# Patient Record
Sex: Female | Born: 1935 | Race: White | Hispanic: No | Smoking: Never smoker
Health system: Southern US, Community
[De-identification: ages and names within clinical notes are randomized; demographics above are authoritative.]

## PROBLEM LIST (undated history)

## (undated) DIAGNOSIS — F419 Anxiety disorder, unspecified: Secondary | ICD-10-CM

## (undated) DIAGNOSIS — G43909 Migraine, unspecified, not intractable, without status migrainosus: Secondary | ICD-10-CM

## (undated) DIAGNOSIS — I10 Essential (primary) hypertension: Secondary | ICD-10-CM

## (undated) DIAGNOSIS — M1990 Unspecified osteoarthritis, unspecified site: Secondary | ICD-10-CM

## (undated) HISTORY — DX: Unspecified osteoarthritis, unspecified site: M19.90

## (undated) HISTORY — DX: Essential (primary) hypertension: I10

## (undated) HISTORY — DX: Migraine, unspecified, not intractable, without status migrainosus: G43.909

## (undated) HISTORY — PX: ABDOMINAL HYSTERECTOMY: SHX81

## (undated) HISTORY — DX: Anxiety disorder, unspecified: F41.9

---

## 1997-05-19 ENCOUNTER — Ambulatory Visit (HOSPITAL_COMMUNITY): Admission: RE | Admit: 1997-05-19 | Discharge: 1997-05-19 | Payer: Self-pay | Admitting: Gastroenterology

## 1999-12-23 ENCOUNTER — Encounter: Payer: Self-pay | Admitting: Internal Medicine

## 1999-12-23 ENCOUNTER — Ambulatory Visit (HOSPITAL_COMMUNITY): Admission: RE | Admit: 1999-12-23 | Discharge: 1999-12-23 | Payer: Self-pay | Admitting: Internal Medicine

## 2000-01-19 ENCOUNTER — Other Ambulatory Visit: Admission: RE | Admit: 2000-01-19 | Discharge: 2000-01-19 | Payer: Self-pay | Admitting: Obstetrics and Gynecology

## 2000-03-25 ENCOUNTER — Emergency Department (HOSPITAL_COMMUNITY): Admission: EM | Admit: 2000-03-25 | Discharge: 2000-03-25 | Payer: Self-pay | Admitting: Emergency Medicine

## 2000-03-25 ENCOUNTER — Encounter: Payer: Self-pay | Admitting: Emergency Medicine

## 2000-05-30 ENCOUNTER — Ambulatory Visit (HOSPITAL_COMMUNITY): Admission: RE | Admit: 2000-05-30 | Discharge: 2000-05-30 | Payer: Self-pay | Admitting: General Surgery

## 2000-05-30 ENCOUNTER — Encounter (INDEPENDENT_AMBULATORY_CARE_PROVIDER_SITE_OTHER): Payer: Self-pay | Admitting: Specialist

## 2001-01-29 ENCOUNTER — Other Ambulatory Visit: Admission: RE | Admit: 2001-01-29 | Discharge: 2001-01-29 | Payer: Self-pay | Admitting: Obstetrics and Gynecology

## 2001-02-06 HISTORY — PX: OTHER SURGICAL HISTORY: SHX169

## 2001-08-04 ENCOUNTER — Emergency Department (HOSPITAL_COMMUNITY): Admission: EM | Admit: 2001-08-04 | Discharge: 2001-08-05 | Payer: Self-pay | Admitting: Emergency Medicine

## 2001-08-05 ENCOUNTER — Encounter: Payer: Self-pay | Admitting: *Deleted

## 2003-04-09 ENCOUNTER — Ambulatory Visit (HOSPITAL_COMMUNITY): Admission: RE | Admit: 2003-04-09 | Discharge: 2003-04-09 | Payer: Self-pay | Admitting: General Surgery

## 2003-04-09 ENCOUNTER — Encounter (INDEPENDENT_AMBULATORY_CARE_PROVIDER_SITE_OTHER): Payer: Self-pay | Admitting: Specialist

## 2003-04-14 ENCOUNTER — Other Ambulatory Visit: Admission: RE | Admit: 2003-04-14 | Discharge: 2003-04-14 | Payer: Self-pay | Admitting: Obstetrics and Gynecology

## 2003-05-12 ENCOUNTER — Encounter: Admission: RE | Admit: 2003-05-12 | Discharge: 2003-05-12 | Payer: Self-pay | Admitting: General Practice

## 2003-12-10 ENCOUNTER — Ambulatory Visit: Payer: Self-pay

## 2004-01-18 ENCOUNTER — Ambulatory Visit: Payer: Self-pay | Admitting: Internal Medicine

## 2004-01-22 ENCOUNTER — Ambulatory Visit: Payer: Self-pay | Admitting: Internal Medicine

## 2004-03-30 ENCOUNTER — Ambulatory Visit: Payer: Self-pay | Admitting: Internal Medicine

## 2004-04-04 ENCOUNTER — Ambulatory Visit: Payer: Self-pay | Admitting: Internal Medicine

## 2004-06-17 ENCOUNTER — Ambulatory Visit: Payer: Self-pay | Admitting: Internal Medicine

## 2004-06-21 ENCOUNTER — Ambulatory Visit: Payer: Self-pay | Admitting: Cardiology

## 2004-07-29 ENCOUNTER — Ambulatory Visit: Payer: Self-pay | Admitting: Gastroenterology

## 2004-08-05 ENCOUNTER — Encounter (INDEPENDENT_AMBULATORY_CARE_PROVIDER_SITE_OTHER): Payer: Self-pay | Admitting: Specialist

## 2004-08-05 ENCOUNTER — Ambulatory Visit: Payer: Self-pay | Admitting: Gastroenterology

## 2004-09-09 ENCOUNTER — Encounter: Admission: RE | Admit: 2004-09-09 | Discharge: 2004-09-09 | Payer: Self-pay | Admitting: Obstetrics and Gynecology

## 2005-01-11 ENCOUNTER — Ambulatory Visit: Payer: Self-pay | Admitting: Internal Medicine

## 2005-01-18 ENCOUNTER — Ambulatory Visit: Payer: Self-pay | Admitting: Internal Medicine

## 2005-03-07 ENCOUNTER — Ambulatory Visit: Payer: Self-pay | Admitting: Internal Medicine

## 2005-03-22 ENCOUNTER — Ambulatory Visit: Payer: Self-pay | Admitting: Internal Medicine

## 2005-03-24 ENCOUNTER — Ambulatory Visit: Payer: Self-pay | Admitting: Internal Medicine

## 2005-09-18 ENCOUNTER — Ambulatory Visit: Payer: Self-pay | Admitting: Internal Medicine

## 2005-09-20 ENCOUNTER — Encounter: Admission: RE | Admit: 2005-09-20 | Discharge: 2005-09-20 | Payer: Self-pay | Admitting: Obstetrics and Gynecology

## 2005-09-28 ENCOUNTER — Ambulatory Visit: Payer: Self-pay | Admitting: Internal Medicine

## 2005-11-23 ENCOUNTER — Ambulatory Visit: Payer: Self-pay | Admitting: Internal Medicine

## 2005-12-12 ENCOUNTER — Ambulatory Visit: Payer: Self-pay

## 2005-12-20 ENCOUNTER — Ambulatory Visit: Payer: Self-pay | Admitting: Internal Medicine

## 2006-01-02 ENCOUNTER — Emergency Department (HOSPITAL_COMMUNITY): Admission: EM | Admit: 2006-01-02 | Discharge: 2006-01-02 | Payer: Self-pay | Admitting: Emergency Medicine

## 2006-01-05 ENCOUNTER — Emergency Department (HOSPITAL_COMMUNITY): Admission: EM | Admit: 2006-01-05 | Discharge: 2006-01-05 | Payer: Self-pay | Admitting: Family Medicine

## 2006-01-15 ENCOUNTER — Emergency Department (HOSPITAL_COMMUNITY): Admission: EM | Admit: 2006-01-15 | Discharge: 2006-01-15 | Payer: Self-pay | Admitting: Emergency Medicine

## 2006-06-21 ENCOUNTER — Emergency Department (HOSPITAL_COMMUNITY): Admission: EM | Admit: 2006-06-21 | Discharge: 2006-06-21 | Payer: Self-pay | Admitting: Family Medicine

## 2006-09-06 ENCOUNTER — Ambulatory Visit: Payer: Self-pay | Admitting: Internal Medicine

## 2006-09-06 LAB — CONVERTED CEMR LAB
AST: 21 units/L (ref 0–37)
Albumin: 3.5 g/dL (ref 3.5–5.2)
Basophils Absolute: 0 10*3/uL (ref 0.0–0.1)
Bilirubin, Direct: 0.1 mg/dL (ref 0.0–0.3)
Direct LDL: 204.6 mg/dL
Eosinophils Absolute: 0.1 10*3/uL (ref 0.0–0.6)
Eosinophils Relative: 2.3 % (ref 0.0–5.0)
GFR calc Af Amer: 56 mL/min
GFR calc non Af Amer: 46 mL/min
Glucose, Bld: 93 mg/dL (ref 70–99)
HCT: 34.4 % — ABNORMAL LOW (ref 36.0–46.0)
HDL: 36.7 mg/dL — ABNORMAL LOW (ref >39.0)
Lymphocytes Relative: 37.5 % (ref 12.0–46.0)
MCHC: 35.1 g/dL (ref 30.0–36.0)
MCV: 85.1 fL (ref 78.0–100.0)
Monocytes Absolute: 0.6 10*3/uL (ref 0.2–0.7)
Neutro Abs: 3.1 10*3/uL (ref 1.4–7.7)
Neutrophils Relative %: 50.1 % (ref 43.0–77.0)
Potassium: 4.6 meq/L (ref 3.5–5.1)
RBC: 4.05 M/uL (ref 3.87–5.11)
Sodium: 141 meq/L (ref 135–145)
TSH: 0.92 microintl units/mL (ref 0.35–5.50)
Triglycerides: 133 mg/dL (ref 0–149)
WBC: 6.1 10*3/uL (ref 4.5–10.5)

## 2006-11-01 ENCOUNTER — Encounter: Admission: RE | Admit: 2006-11-01 | Discharge: 2006-11-01 | Payer: Self-pay | Admitting: Obstetrics and Gynecology

## 2006-11-26 ENCOUNTER — Ambulatory Visit: Payer: Self-pay | Admitting: Internal Medicine

## 2006-11-26 DIAGNOSIS — K649 Unspecified hemorrhoids: Secondary | ICD-10-CM | POA: Insufficient documentation

## 2006-11-26 DIAGNOSIS — K589 Irritable bowel syndrome without diarrhea: Secondary | ICD-10-CM

## 2006-11-26 DIAGNOSIS — G43809 Other migraine, not intractable, without status migrainosus: Secondary | ICD-10-CM

## 2006-11-26 LAB — CONVERTED CEMR LAB
Basophils Absolute: 0.1 10*3/uL (ref 0.0–0.1)
Bilirubin, Direct: 0.1 mg/dL (ref 0.0–0.3)
CO2: 28 meq/L (ref 19–32)
Cholesterol: 290 mg/dL (ref 0–200)
Direct LDL: 208.4 mg/dL
Eosinophils Absolute: 0.1 10*3/uL (ref 0.0–0.6)
Eosinophils Relative: 1.9 % (ref 0.0–5.0)
GFR calc Af Amer: 56 mL/min
Glucose, Bld: 96 mg/dL (ref 70–99)
HDL: 48.7 mg/dL (ref >39.0)
Hemoglobin, Urine: NEGATIVE
Ketones, ur: NEGATIVE mg/dL
Lymphocytes Relative: 35.3 % (ref 12.0–46.0)
MCHC: 33.7 g/dL (ref 30.0–36.0)
MCV: 87.7 fL (ref 78.0–100.0)
Mucus, UA: NEGATIVE
Neutro Abs: 3.1 10*3/uL (ref 1.4–7.7)
Platelets: 321 10*3/uL (ref 150–400)
Potassium: 4.6 meq/L (ref 3.5–5.1)
RBC / HPF: NONE SEEN
TSH: 1.48 microintl units/mL (ref 0.35–5.50)
Total Protein, Urine: NEGATIVE mg/dL
Total Protein: 7.5 g/dL (ref 6.0–8.3)
Triglycerides: 81 mg/dL (ref 0–149)
Urine Glucose: NEGATIVE mg/dL
Urobilinogen, UA: 0.2 (ref 0.0–1.0)
WBC: 5.7 10*3/uL (ref 4.5–10.5)

## 2006-12-03 ENCOUNTER — Encounter: Payer: Self-pay | Admitting: Internal Medicine

## 2006-12-03 DIAGNOSIS — M1712 Unilateral primary osteoarthritis, left knee: Secondary | ICD-10-CM

## 2006-12-03 DIAGNOSIS — F411 Generalized anxiety disorder: Secondary | ICD-10-CM | POA: Insufficient documentation

## 2006-12-03 DIAGNOSIS — M949 Disorder of cartilage, unspecified: Secondary | ICD-10-CM

## 2006-12-03 DIAGNOSIS — E034 Atrophy of thyroid (acquired): Secondary | ICD-10-CM

## 2006-12-03 DIAGNOSIS — D509 Iron deficiency anemia, unspecified: Secondary | ICD-10-CM

## 2006-12-03 DIAGNOSIS — M899 Disorder of bone, unspecified: Secondary | ICD-10-CM | POA: Insufficient documentation

## 2007-02-20 ENCOUNTER — Ambulatory Visit: Payer: Self-pay | Admitting: Internal Medicine

## 2007-02-20 DIAGNOSIS — E7800 Pure hypercholesterolemia, unspecified: Secondary | ICD-10-CM

## 2007-02-20 DIAGNOSIS — F418 Other specified anxiety disorders: Secondary | ICD-10-CM

## 2007-02-21 LAB — CONVERTED CEMR LAB
ALT: 16 units/L (ref 0–35)
AST: 20 units/L (ref 0–37)
Bilirubin, Direct: 0.1 mg/dL (ref 0.0–0.3)
Calcium: 9.2 mg/dL (ref 8.4–10.5)
Chloride: 106 meq/L (ref 96–112)
Cholesterol: 276 mg/dL (ref 0–200)
GFR calc non Af Amer: 42 mL/min
Glucose, Bld: 95 mg/dL (ref 70–99)
Hemoglobin, Urine: NEGATIVE
Leukocytes, UA: NEGATIVE
Urobilinogen, UA: 0.2 (ref 0.0–1.0)
VLDL: 24 mg/dL (ref 0–40)
pH: 5.5 (ref 5.0–8.0)

## 2007-02-28 ENCOUNTER — Ambulatory Visit: Payer: Self-pay | Admitting: Internal Medicine

## 2007-06-19 ENCOUNTER — Ambulatory Visit: Payer: Self-pay | Admitting: Internal Medicine

## 2007-06-21 ENCOUNTER — Ambulatory Visit: Payer: Self-pay | Admitting: Internal Medicine

## 2007-06-21 LAB — CONVERTED CEMR LAB
BUN: 16 mg/dL (ref 6–23)
CO2: 27 meq/L (ref 19–32)
Calcium: 9.4 mg/dL (ref 8.4–10.5)
Chloride: 108 meq/L (ref 96–112)
Creatinine, Ser: 1.3 mg/dL — ABNORMAL HIGH (ref 0.4–1.2)
GFR calc Af Amer: 51 mL/min
GFR calc non Af Amer: 42 mL/min
Glucose, Bld: 96 mg/dL (ref 70–99)
Potassium: 5 meq/L (ref 3.5–5.1)
Sodium: 142 meq/L (ref 135–145)

## 2007-06-26 ENCOUNTER — Ambulatory Visit: Payer: Self-pay | Admitting: Internal Medicine

## 2007-06-26 DIAGNOSIS — G4700 Insomnia, unspecified: Secondary | ICD-10-CM

## 2007-08-07 ENCOUNTER — Encounter: Payer: Self-pay | Admitting: Internal Medicine

## 2007-10-03 ENCOUNTER — Ambulatory Visit: Payer: Self-pay | Admitting: Internal Medicine

## 2007-10-06 LAB — CONVERTED CEMR LAB
BUN: 14 mg/dL (ref 6–23)
CO2: 27 meq/L (ref 19–32)
Chloride: 109 meq/L (ref 96–112)
Creatinine, Ser: 1.3 mg/dL — ABNORMAL HIGH (ref 0.4–1.2)
GFR calc non Af Amer: 42 mL/min
Glucose, Bld: 94 mg/dL (ref 70–99)

## 2007-10-07 ENCOUNTER — Ambulatory Visit: Payer: Self-pay | Admitting: Internal Medicine

## 2007-10-07 DIAGNOSIS — E875 Hyperkalemia: Secondary | ICD-10-CM

## 2007-10-22 ENCOUNTER — Ambulatory Visit: Payer: Self-pay | Admitting: Internal Medicine

## 2007-10-24 LAB — CONVERTED CEMR LAB
BUN: 24 mg/dL — ABNORMAL HIGH (ref 6–23)
Chloride: 111 meq/L (ref 96–112)
GFR calc Af Amer: 47 mL/min
GFR calc non Af Amer: 39 mL/min
Potassium: 5.1 meq/L (ref 3.5–5.1)
Sodium: 140 meq/L (ref 135–145)

## 2007-10-25 ENCOUNTER — Encounter (INDEPENDENT_AMBULATORY_CARE_PROVIDER_SITE_OTHER): Payer: Self-pay | Admitting: *Deleted

## 2007-11-04 ENCOUNTER — Encounter: Admission: RE | Admit: 2007-11-04 | Discharge: 2007-11-04 | Payer: Self-pay | Admitting: Obstetrics and Gynecology

## 2008-05-08 ENCOUNTER — Ambulatory Visit (HOSPITAL_COMMUNITY): Admission: RE | Admit: 2008-05-08 | Discharge: 2008-05-08 | Payer: Self-pay | Admitting: Family Medicine

## 2008-05-19 ENCOUNTER — Encounter: Admission: RE | Admit: 2008-05-19 | Discharge: 2008-05-19 | Payer: Self-pay | Admitting: Family Medicine

## 2008-10-07 ENCOUNTER — Encounter: Payer: Self-pay | Admitting: Internal Medicine

## 2008-10-19 ENCOUNTER — Ambulatory Visit: Payer: Self-pay

## 2008-10-19 ENCOUNTER — Encounter: Payer: Self-pay | Admitting: Family Medicine

## 2008-10-19 ENCOUNTER — Encounter: Payer: Self-pay | Admitting: Internal Medicine

## 2008-11-24 ENCOUNTER — Encounter: Admission: RE | Admit: 2008-11-24 | Discharge: 2008-11-24 | Payer: Self-pay | Admitting: Obstetrics and Gynecology

## 2009-02-28 LAB — CREATININE, SERUM: Creat: 1.3

## 2009-03-15 ENCOUNTER — Encounter: Admission: RE | Admit: 2009-03-15 | Discharge: 2009-03-15 | Payer: Self-pay | Admitting: Family Medicine

## 2009-06-08 ENCOUNTER — Encounter: Payer: Self-pay | Admitting: Gastroenterology

## 2009-07-20 ENCOUNTER — Encounter (INDEPENDENT_AMBULATORY_CARE_PROVIDER_SITE_OTHER): Payer: Self-pay | Admitting: *Deleted

## 2009-07-22 ENCOUNTER — Encounter (INDEPENDENT_AMBULATORY_CARE_PROVIDER_SITE_OTHER): Payer: Self-pay | Admitting: *Deleted

## 2009-08-23 ENCOUNTER — Encounter (INDEPENDENT_AMBULATORY_CARE_PROVIDER_SITE_OTHER): Payer: Self-pay | Admitting: *Deleted

## 2009-08-25 ENCOUNTER — Ambulatory Visit: Payer: Self-pay | Admitting: Gastroenterology

## 2009-09-06 ENCOUNTER — Telehealth: Payer: Self-pay | Admitting: Gastroenterology

## 2009-09-06 ENCOUNTER — Ambulatory Visit: Payer: Self-pay | Admitting: Gastroenterology

## 2009-11-07 ENCOUNTER — Ambulatory Visit: Payer: Self-pay | Admitting: Family

## 2009-11-07 ENCOUNTER — Inpatient Hospital Stay (HOSPITAL_COMMUNITY): Admission: AD | Admit: 2009-11-07 | Discharge: 2009-11-07 | Payer: Self-pay | Admitting: Obstetrics and Gynecology

## 2009-12-01 ENCOUNTER — Encounter: Admission: RE | Admit: 2009-12-01 | Discharge: 2009-12-01 | Payer: Self-pay | Admitting: Obstetrics and Gynecology

## 2010-02-26 ENCOUNTER — Encounter: Payer: Self-pay | Admitting: Internal Medicine

## 2010-02-27 ENCOUNTER — Encounter: Payer: Self-pay | Admitting: Gastroenterology

## 2010-02-27 ENCOUNTER — Encounter: Payer: Self-pay | Admitting: Unknown Physician Specialty

## 2010-03-06 LAB — CONVERTED CEMR LAB
BUN: 19 mg/dL (ref 6–23)
Chloride: 113 meq/L — ABNORMAL HIGH (ref 96–112)
GFR calc Af Amer: 51 mL/min
Glucose, Bld: 97 mg/dL (ref 70–99)
Potassium: 6.1 meq/L (ref 3.5–5.1)

## 2010-03-06 LAB — BASIC METABOLIC PANEL: Creatinine: 1.4 mg/dL — AB (ref <=1.1)

## 2010-03-08 NOTE — Procedures (Signed)
Summary: Upper Endoscopy/[facility]  Upper Endoscopy/[facility]   Imported By: Sherian Rein 09/08/2009 10:31:07  _____________________________________________________________________  External Attachment:    Type:   Image     Comment:   External Document

## 2010-03-08 NOTE — Progress Notes (Signed)
Summary: Canceled colonoscopy   Phone Note Call from Patient Call back at Home Phone [phone]   Caller: Daughter Call For: Dr. Arlyce Dice Summary of Call: Canceled COL for 09-08-09 due to her mother's age she does not feel like she is up to having the procedure. Would you like this pt. charged the cancelation fee? Initial call taken by: Karna Christmas,  September 06, 2009 8:20 AM  Follow-up for Phone Call        yes Follow-up by: Louis Meckel MD,  September 06, 2009 9:38 AM  Additional Follow-up for Phone Call Additional follow up Details #1::        Patient BILLED. Additional Follow-up by: Leanor Kail [facility],  September 10, 2009 4:19 PM

## 2010-03-08 NOTE — Letter (Signed)
Summary: Previsit letter  [facility]  [address] [place], [place] [address]   Phone: [phone]  Fax: [phone]       07/22/2009 MRN: [id number]  Anita Banks [address] [place], [place]  [address]  Dear [name],  Welcome to the Gastroenterology Division at [facility].    You are scheduled to see a nurse for your pre-procedure visit on 08-25-09 at 4:00p.m. on the 3rd floor at [facility], [address]  We ask that you try to arrive at our office 15 minutes prior to your appointment time to allow for check-in.  Your nurse visit will consist of discussing your medical and surgical history, your immediate family medical history, and your medications.    Please bring a complete list of all your medications or, if you prefer, bring the medication bottles and we will list them.  We will need to be aware of both prescribed and over the counter drugs.  We will need to know exact dosage information as well.  If you are on blood thinners (Coumadin, Plavix, Aggrenox, Ticlid, etc.) please call our office today/prior to your appointment, as we need to consult with your physician about holding your medication.   Please be prepared to read and sign documents such as consent forms, a financial agreement, and acknowledgement forms.  If necessary, and with your consent, a friend or relative is welcome to sit-in on the nurse visit with you.  Please bring your insurance card so that we may make a copy of it.  If your insurance requires a referral to see a specialist, please bring your referral form from your primary care physician.  No co-pay is required for this nurse visit.     If you cannot keep your appointment, please call [phone] to cancel or reschedule prior to your appointment date.  This allows [place] the opportunity to schedule an appointment for another patient in need of care.    Thank you for choosing [facility] for your  medical needs.  We appreciate the opportunity to care for you.  Please visit [place] at our website  to learn more about our practice.                     Sincerely.                                                                                                                   The Gastroenterology Division

## 2010-03-08 NOTE — Procedures (Signed)
Summary: COLONOSCOPY   Colonoscopy  Procedure date:  08/05/2004  Findings:      Location:  [facility].      Patient Name: Anita Banks, Anita Banks MRN:  Procedure Procedures: Colonoscopy CPT: [phone].    with polypectomy. CPT: A3573898.  Personnel: Endoscopist: Barbette Hair. Arlyce Dice, MD.  Patient Consent: Procedure, Alternatives, Risks and Benefits discussed, consent obtained, from patient.  Indications Symptoms: Abdominal pain / bloating.  History  Current Medications: Patient is not currently taking Coumadin.  Pre-Exam Physical: Performed Aug 05, 2004. Cardio-pulmonary exam, HEENT exam , Abdominal exam, Mental status exam WNL.  Exam Exam: Extent of exam reached: Cecum, extent intended: Cecum.  The cecum was identified by IC valve. Colon retroflexion performed. ASA Classification: II. Tolerance: good.  Monitoring: Pulse and BP monitoring, Oximetry used. Supplemental O2 given. at 2 Liters.  Colon Prep Used Miralax for colon prep. Prep results: good.  Sedation Meds: Patient assessed and found to be appropriate for moderate (conscious) sedation. Sedation was managed by the Endoscopist. Fentanyl 100 mcg. given IV. Versed 7 mg. given IV.  Findings - MELANOSIS: Cecum to Rectum.  POLYP: Sigmoid Colon, Maximum size: 6 mm. Distance from Anus 18 cm. Procedure:  snare with cautery, Polyp sent to pathology. ICD9: Colon Polyps: 211.3.   Assessment Abnormal examination, see findings above.  Diagnoses: 211.3: Colon Polyps.   Events  Unplanned Interventions: No intervention was required.  Unplanned Events: There were no complications. Plans  Post Exam Instructions: Post sedation instructions given.  Patient Education: Patient given standard instructions for: Polyps.  Disposition: After procedure patient sent to recovery. After recovery patient sent home.  Scheduling/Referral: Colonoscopy, to Barbette Hair. Arlyce Dice, MD, around Aug 05, 2009.    This report was  created from the original endoscopy report, which was reviewed and signed by the above listed endoscopist.

## 2010-03-08 NOTE — Letter (Signed)
Summary: Anita Banks Instructions  [facility]  [address] [place], [place] [address]   Phone: [phone]  Fax: [phone]       Anita Banks    29-May-1927    MRN: [id number]        Procedure Day Dorna Bloom:  Wednesday 09/08/2009     Arrival Time: 8:30 am      Procedure Time:  9:30 am     Location of Procedure:                    _x _  [facility] (4th Floor)                        PREPARATION FOR COLONOSCOPY WITH MOVIPREP   Starting 5 days prior to your procedure Friday 7/29 do not eat nuts, seeds, popcorn, corn, beans, peas,  salads, or any raw vegetables.  Do not take any fiber supplements (e.g. Metamucil, Citrucel, and Benefiber).  THE DAY BEFORE YOUR PROCEDURE         DATE: Tuesday 8/2  1.  Drink clear liquids the entire day-NO SOLID FOOD  2.  Do not drink anything colored red or purple.  Avoid juices with pulp.  No orange juice.  3.  Drink at least 64 oz. (8 glasses) of fluid/clear liquids during the day to prevent dehydration and help the prep work efficiently.  CLEAR LIQUIDS INCLUDE: Water Jello Ice Popsicles Tea (sugar ok, no milk/cream) Powdered fruit flavored drinks Coffee (sugar ok, no milk/cream) Gatorade Juice: apple, white grape, white cranberry  Lemonade Clear bullion, consomm, broth Carbonated beverages (any kind) Strained chicken noodle soup Hard Candy                             4.  In the morning, mix first dose of MoviPrep solution:    Empty 1 Pouch A and 1 Pouch B into the disposable container    Add lukewarm drinking water to the top line of the container. Mix to dissolve    Refrigerate (mixed solution should be used within 24 hrs)  5.  Begin drinking the prep at 5:00 p.m. The MoviPrep container is divided by 4 marks.   Every 15 minutes drink the solution down to the next mark (approximately 8 oz) until the full liter is complete.   6.  Follow completed prep with 16 oz of clear liquid of your choice  (Nothing red or purple).  Continue to drink clear liquids until bedtime.  7.  Before going to bed, mix second dose of MoviPrep solution:    Empty 1 Pouch A and 1 Pouch B into the disposable container    Add lukewarm drinking water to the top line of the container. Mix to dissolve    Refrigerate  THE DAY OF YOUR PROCEDURE      DATE: Wednesday 8/3  Beginning at 4:30 a.m. (5 hours before procedure):         1. Every 15 minutes, drink the solution down to the next mark (approx 8 oz) until the full liter is complete.  2. Follow completed prep with 16 oz. of clear liquid of your choice.    3. You may drink clear liquids until 7:30 am 2 HOURS BEFORE PROCEDURE).   MEDICATION INSTRUCTIONS  Unless otherwise instructed, you should take regular prescription medications with a small sip of water   as early as possible the morning  of your procedure.           OTHER INSTRUCTIONS  You will need a responsible adult at least 75 years of age to accompany you and drive you home.   This person must remain in the waiting room during your procedure.  Wear loose fitting clothing that is easily removed.  Leave jewelry and other valuables at home.  However, you may wish to bring a book to read or  an iPod/MP3 player to listen to music as you wait for your procedure to start.  Remove all body piercing jewelry and leave at home.  Total time from sign-in until discharge is approximately 2-3 hours.  You should go home directly after your procedure and rest.  You can resume normal activities the  day after your procedure.  The day of your procedure you should not:   Drive   Make legal decisions   Operate machinery   Drink alcohol   Return to work  You will receive specific instructions about eating, activities and medications before you leave.    The above instructions have been reviewed and explained to me by   Ezra Sites RN  August 25, 2009 4:35 PM     I fully understand and  can verbalize these instructions _____________________________ Date _________

## 2010-03-08 NOTE — Miscellaneous (Signed)
Summary: [facility]  Clinical Lists Changes  Medications: Added new medication of MOVIPREP 100 GM  SOLR (PEG-KCL-NACL-NASULF-NA ASC-C) As per prep instructions. - Signed Rx of MOVIPREP 100 GM  SOLR (PEG-KCL-NACL-NASULF-NA ASC-C) As per prep instructions.;  #1 x 0;  Signed;  Entered by: Ezra Sites RN;  Authorized by: Louis Meckel MD;  Method used: Electron[facility] to [facility] [address] [phone]*, [address] [place], [place], [place]  [address], Ph: [phone] or [phone], Fax: [phone] Observations: Added new observation of ALLERGY REV: Done (08/25/2009 15:33)    Prescriptions: MOVIPREP 100 GM  SOLR (PEG-KCL-NACL-NASULF-NA ASC-C) As per prep instructions.  #1 x 0   Entered by:   Ezra Sites RN   Authorized by:   Louis Meckel MD   Signed by:   Ezra Sites RN on 08/25/2009   Method used:   [facility]        [facility] [address] [phone]* (retail)       [address]       [place], [place]  [address]       Ph: [phone] or [phone]       Fax: [phone]   RxID:   [id number]

## 2010-03-08 NOTE — Letter (Signed)
Summary: Colonoscopy Letter  [facility]  [address] [place], [place] [address]   Phone: [phone]  Fax: [phone]      July 20, 2009 MRN: [id number]   Anita Banks [address] [place], [place]  [address]   Dear Ms. Bari,   According to your medical record, it is time for you to schedule a Colonoscopy. The American Cancer Society recommends this procedure as a method to detect early colon cancer. Patients with a family history of colon cancer, or a personal history of colon polyps or inflammatory bowel disease are at increased risk.  This letter has been generated based on the recommendations made at the time of your procedure. If you feel that in your particular situation this may no longer apply, please contact our office.  Please call our office at [phone] to schedule this appointment or to update your records at your earliest convenience.  Thank you for cooperating with [place] to provide you with the very best care possible.   Sincerely,  Barbette Hair. Arlyce Dice, M.D.  [facility]enter[facility] [phone]

## 2010-04-06 LAB — TSH: TSH: 6.21 u[IU]/mL — AB (ref <=5.90)

## 2010-06-24 NOTE — Op Note (Signed)
[facility]  Patient:    Anita Banks, Anita Banks                 MRN: [id number] Proc. Date: 05/30/00 Adm. Date:  [id number] Attending:  Carson Myrtle CC:         Sheppard Plumber. Earlene Plater, M.D.  Sherry A. Rosalio Macadamia, M.D.   Operative Report  PREOPERATIVE DIAGNOSIS:  Internal and external hemorrhoids.  POSTOPERATIVE DIAGNOSIS:  Internal and external hemorrhoids.  PROCEDURE:  Hemorrhoidectomy.  SURGEON:  Timothy E. Earlene Plater, M.D.  ANESTHESIA:  General.  INDICATIONS:  This is a 77 year old [place] female who is attended by her daughter, who speaks [place], who is scheduled to have examination under anesthesia and gynecologic procedure for uterine polyps by Cordelia Pen A. Rosalio Macadamia, M.D., and because of a long history of hemorrhoids, protrusion, soilage, and drainage, she wishes to proceed with a hemorrhoidectomy, and I have agreed.  She does have a history of constipation and uses chronic laxatives and has difficult stools.   The procedure has been carefully explained to her and her daughter, and I believe they agree and understand.  PROCEDURE:  The patient was in the GYN outpatient unit under general anesthesia, and Sherry A. Rosalio Macadamia, M.D., had completed her portion of the procedure.  The patient was stable and in the lithotomy position.  The perianal area was inspected.  A proctoscopy was accomplished to 25 cm and was negative.  The scope was withdrawn.  The anus was injected round about with Marcaine 0.5% with epinephrine, mixed 9:1 with Wydase, and that was massaged in well.  A prominent hemorrhoid was noted anteriorly with a large external tag and a dermal component and internal hemorrhoid of a fourth-degree nature. In the left lateral position, there was a third-degree internal hemorrhoid.  A band ligated the left lateral, excised completely the anterior complex, and closed that wound with a running 2-0 chromic suture.  There was no bleeding  or complication, and though the sphincters were snug, I did not do a sphincterotomy or injure the sphincters.  This completed the procedure. Gelfoam, gauze, and a dry, sterile dressing were applied.  She tolerated it well.  She was removed to the recovery room in good condition.  Written and verbal instructions were given carefully to the daughter for interpretation, along with Percocet for pain, and she will be followed in the office. DD:  05/30/00 TD:  05/31/00 Job: [id number]

## 2010-06-24 NOTE — Op Note (Signed)
NAME:  Anita Banks, Anita Banks                    ACCOUNT NO.:  [id number]   MEDICAL RECORD NO.:  [id number]                   PATIENT TYPE:  AMB   LOCATION:  DAY                                  FACILITY[facility]  [facility]   PHYSICIAN:  Timothy E. Earlene Plater, M.D.              DATE OF BIRTH:  Oct 05, 1927   DATE OF PROCEDURE:  04/09/2003  DATE OF DISCHARGE:                                 OPERATIVE REPORT   PREOPERATIVE DIAGNOSES:  Squamous cell carcinoma right lower leg.   POSTOPERATIVE DIAGNOSES:  Squamous cell carcinoma right lower leg.   PROCEDURE:  Wide excision squamous cell carcinoma right leg.   SURGEON:  Timothy E. Earlene Plater, M.D.   ANESTHESIA:  Local standby.   [name] is 7, [place], has with her the family interpreters. She had a  partial excision squamous cell carcinoma right lower leg by [facility]. Danella Deis,  M.D. and was referred to me for complete excision. The patient does  understand and is agreeable, has signed the permit.  The healing wound is  identified on the anterior lateral aspect of the right lower leg.   She is taken to the operating room, IV started, sedation given. The right  lower leg was prepped and draped in the usual fashion.  A 0.25% Marcaine  with epinephrine was used for wide local anesthesia.  Under magnification,  the skin lesion was marked and a 5 mm margin all around it was identified.  This was 15 x 16 mm. The total elliptical horizontal incision necessary was  16 x 40 mm. This was fashioned and then accomplished and the skin ellipse  removed. It was marked for pathology and sent to pathology in formalin.  Bleeding points were cauterized, the wound was undermined in all aspects and  the closure was in layers.  Retention sutures of 2-0 nylon were used and  final closure sutures of 3-0 nylon were used.  Counts correct, she tolerated  it well, dry sterile dressing applied and she was removed to the recovery  room in good condition.   Written and verbal instructions  were given including Darvocet #36. She will  be followed in the office.                                               Timothy E. Earlene Plater, M.D.    TED/MEDQ  D:  04/09/2003  T:  04/09/2003  Job:  [id number]   cc:   Hope M. Danella Deis, M.D.  [address]  [place]  [place] [address]  Fax: [phone]

## 2010-06-24 NOTE — Op Note (Signed)
[facility]  Patient:    Anita Banks, Anita Banks                 MRN: [id number] Proc. Date: 05/30/00 Adm. Date:  [id number] Attending:  Carson Myrtle                           Operative Report  PREOPERATIVE DIAGNOSIS:  Thickened endometrium.  POSTOPERATIVE DIAGNOSES:  Thickened endometrium and endometrial polyp.  PROCEDURE:  D&C hysteroscopy with the resectoscope.  SURGEON:  Dr. Rosalio Macadamia.  ANESTHESIA:  General.  INDICATIONS FOR PROCEDURE:  This is a a 77 year old, G1, P1-0-0-1 woman who was found to have a thickened endometrium on ultrasound. Ultrasound had been performed because of a problem with right lower quadrant pain. The patient has had no vaginal bleeding but because of the persistence of this thickening on ultrasound, the patients brought to the operating room for [facility] hysteroscopy with the resectoscope. The patients also complained of painful hemorrhoids and the patient will be operated on by Dr. Kendrick Ranch for a hemorrhoidectomy following this procedure.  FINDINGS:  Approximately 2-3 cm endometrial polyp, normal size anteflexed uterus, no adnexal mass.  DESCRIPTION OF PROCEDURE:  The patient was brought into the operating room and given adequate general anesthesia. She was placed in a dorsal lithotomy position. Her perineum and vagina were washed with Betadine. Pelvic examination was performed, the bladder was in and out catheterized sterilely. The physicians gown and gloves were changed. The patient was draped in a sterile fashion. A speculum was placed within the vagina. Paracervical block was administered with 2% ______. The anterior lip of the cervix was grasped with a single tooth tenaculum, the cervix was sounded, cervix was dilated with the [facility] dilators to a #31.  The hysteroscope was introduced, pictures were obtained. The endometrial polyps were removed with a double loop right angled resector. There was one large polyp  present and very small polyp near the right cornua present as well. Thin small superficial samples were taken circumferentially from the endometrial tissue. Adequate hemostasis was present. All instruments were removed from the vagina. The patient was then operated on by Dr. Earlene Plater which is dictated separately. After Dr. Earlene Plater surgery, she was awakened, she was extubated and removed from the operating table to the stretcher in stable condition. Complications were none. Estimated blood loss less than 5 cc. Sorbitol differential -10 cc. DD:  05/30/00 TD:  05/30/00 Job: [id number]

## 2010-08-06 ENCOUNTER — Emergency Department (HOSPITAL_COMMUNITY)
Admission: EM | Admit: 2010-08-06 | Discharge: 2010-08-06 | Disposition: A | Discharge status: Home / Self Care | Payer: Medicare Other | Attending: Emergency Medicine | Admitting: Emergency Medicine

## 2010-08-06 ENCOUNTER — Emergency Department (HOSPITAL_COMMUNITY): Payer: Medicare Other

## 2010-08-06 DIAGNOSIS — K219 Gastro-esophageal reflux disease without esophagitis: Secondary | ICD-10-CM | POA: Insufficient documentation

## 2010-08-06 DIAGNOSIS — R5381 Other malaise: Secondary | ICD-10-CM | POA: Insufficient documentation

## 2010-08-06 DIAGNOSIS — E785 Hyperlipidemia, unspecified: Secondary | ICD-10-CM | POA: Insufficient documentation

## 2010-08-06 DIAGNOSIS — M79609 Pain in unspecified limb: Secondary | ICD-10-CM | POA: Insufficient documentation

## 2010-08-06 DIAGNOSIS — E860 Dehydration: Secondary | ICD-10-CM | POA: Insufficient documentation

## 2010-08-06 DIAGNOSIS — Z79899 Other long term (current) drug therapy: Secondary | ICD-10-CM | POA: Insufficient documentation

## 2010-08-06 DIAGNOSIS — R5383 Other fatigue: Secondary | ICD-10-CM | POA: Insufficient documentation

## 2010-08-06 DIAGNOSIS — R109 Unspecified abdominal pain: Secondary | ICD-10-CM | POA: Insufficient documentation

## 2010-08-06 DIAGNOSIS — R141 Gas pain: Secondary | ICD-10-CM | POA: Insufficient documentation

## 2010-08-06 DIAGNOSIS — I10 Essential (primary) hypertension: Secondary | ICD-10-CM | POA: Insufficient documentation

## 2010-08-06 DIAGNOSIS — R110 Nausea: Secondary | ICD-10-CM | POA: Insufficient documentation

## 2010-08-06 DIAGNOSIS — E039 Hypothyroidism, unspecified: Secondary | ICD-10-CM | POA: Insufficient documentation

## 2010-08-06 DIAGNOSIS — Z7982 Long term (current) use of aspirin: Secondary | ICD-10-CM | POA: Insufficient documentation

## 2010-08-06 DIAGNOSIS — R142 Eructation: Secondary | ICD-10-CM | POA: Insufficient documentation

## 2010-08-06 LAB — BASIC METABOLIC PANEL
Calcium: 9.4 mg/dL (ref 8.4–10.5)
GFR calc non Af Amer: 38 mL/min — ABNORMAL LOW (ref >60)
Potassium: 3.9 mEq/L (ref 3.5–5.1)
Sodium: 137 mEq/L (ref 135–145)

## 2010-08-06 LAB — URINALYSIS, ROUTINE W REFLEX MICROSCOPIC
Bilirubin Urine: NEGATIVE
Glucose, UA: NEGATIVE mg/dL
Hgb urine dipstick: NEGATIVE
Ketones, ur: NEGATIVE mg/dL
Protein, ur: NEGATIVE mg/dL

## 2010-08-06 LAB — DIFFERENTIAL
Basophils Absolute: 0 10*3/uL (ref 0.0–0.1)
Basophils Relative: 1 % (ref 0–1)
Eosinophils Absolute: 0.2 10*3/uL (ref 0.0–0.7)
Monocytes Absolute: 0.7 10*3/uL (ref 0.1–1.0)
Monocytes Relative: 10 % (ref 3–12)
Neutrophils Relative %: 61 % (ref 43–77)

## 2010-08-06 LAB — URINE MICROSCOPIC-ADD ON

## 2010-08-06 LAB — CBC
MCH: 29.5 pg (ref 26.0–34.0)
MCHC: 34.2 g/dL (ref 30.0–36.0)
Platelets: 286 10*3/uL (ref 150–400)
RBC: 3.9 MIL/uL (ref 3.87–5.11)

## 2010-08-06 LAB — CK TOTAL AND CKMB (NOT AT ARMC)
CK, MB: 3.5 ng/mL (ref 0.3–4.0)
Total CK: 138 U/L (ref 7–177)

## 2010-09-19 ENCOUNTER — Telehealth: Payer: Self-pay | Admitting: *Deleted

## 2010-09-19 NOTE — Telephone Encounter (Signed)
LEFT MESSAGE FOR PT THAT COLONOSCOPY IS DUE TO CALL BACK AND GET SCHEDULED

## 2010-09-26 NOTE — Telephone Encounter (Signed)
2nd attempt to contact pt. L/M for pt to contact the office

## 2010-11-14 ENCOUNTER — Other Ambulatory Visit: Payer: Self-pay | Admitting: Obstetrics & Gynecology

## 2010-11-23 ENCOUNTER — Other Ambulatory Visit: Payer: Self-pay | Admitting: Obstetrics & Gynecology

## 2010-11-23 DIAGNOSIS — Z1231 Encounter for screening mammogram for malignant neoplasm of breast: Secondary | ICD-10-CM

## 2010-12-21 ENCOUNTER — Ambulatory Visit: Payer: Medicare Other

## 2011-01-17 ENCOUNTER — Ambulatory Visit
Admission: RE | Admit: 2011-01-17 | Discharge: 2011-01-17 | Disposition: A | Discharge status: Home / Self Care | Payer: Medicare Other | Source: Ambulatory Visit | Attending: Obstetrics & Gynecology | Admitting: Obstetrics & Gynecology

## 2011-01-17 DIAGNOSIS — Z1231 Encounter for screening mammogram for malignant neoplasm of breast: Secondary | ICD-10-CM

## 2011-02-13 DIAGNOSIS — N814 Uterovaginal prolapse, unspecified: Secondary | ICD-10-CM | POA: Diagnosis not present

## 2011-02-26 ENCOUNTER — Encounter: Payer: Self-pay | Admitting: Family Medicine

## 2011-02-26 LAB — SEDIMENTATION RATE: Sed Rate: 32 mm

## 2011-02-26 LAB — FERRITIN: Ferritin: 24 ng/mL (ref 9.0–150.0)

## 2011-03-15 DIAGNOSIS — D239 Other benign neoplasm of skin, unspecified: Secondary | ICD-10-CM | POA: Diagnosis not present

## 2011-03-15 DIAGNOSIS — L821 Other seborrheic keratosis: Secondary | ICD-10-CM | POA: Diagnosis not present

## 2011-03-15 DIAGNOSIS — Z85828 Personal history of other malignant neoplasm of skin: Secondary | ICD-10-CM | POA: Diagnosis not present

## 2011-03-23 ENCOUNTER — Other Ambulatory Visit: Payer: Self-pay | Admitting: Physician Assistant

## 2011-03-23 MED ORDER — ZOLPIDEM TARTRATE 10 MG PO TABS: 10.0000 mg | ORAL_TABLET | Freq: Every evening | ORAL | Status: DC | PRN

## 2011-04-02 ENCOUNTER — Ambulatory Visit (INDEPENDENT_AMBULATORY_CARE_PROVIDER_SITE_OTHER): Payer: Medicare Other | Admitting: Family Medicine

## 2011-04-02 VITALS — BP 144/77 | HR 66 | Temp 98.0°F | Resp 16 | Ht 63.0 in | Wt 155.0 lb

## 2011-04-02 DIAGNOSIS — IMO0001 Reserved for inherently not codable concepts without codable children: Secondary | ICD-10-CM

## 2011-04-02 DIAGNOSIS — R51 Headache: Secondary | ICD-10-CM | POA: Diagnosis not present

## 2011-04-02 DIAGNOSIS — E785 Hyperlipidemia, unspecified: Secondary | ICD-10-CM

## 2011-04-02 DIAGNOSIS — E039 Hypothyroidism, unspecified: Secondary | ICD-10-CM | POA: Diagnosis not present

## 2011-04-02 DIAGNOSIS — R5381 Other malaise: Secondary | ICD-10-CM

## 2011-04-02 DIAGNOSIS — D649 Anemia, unspecified: Secondary | ICD-10-CM

## 2011-04-02 DIAGNOSIS — R5383 Other fatigue: Secondary | ICD-10-CM

## 2011-04-02 LAB — POCT CBC
Granulocyte percent: 56.5 %G (ref 37–80)
HCT, POC: 36.6 % — AB (ref 37.7–47.9)
Hemoglobin: 11.6 g/dL — AB (ref 12.2–16.2)
Lymph, poc: 2.2 (ref 0.6–3.4)
MCH, POC: 28 pg (ref 27–31.2)
MCHC: 31.7 g/dL — AB (ref 31.8–35.4)
MCV: 88.2 fL (ref 80–97)
MID (cbc): 0.4 (ref 0–0.9)
MPV: 13.3 fL (ref 0–99.8)
POC Granulocyte: 3.4 (ref 2–6.9)
POC LYMPH PERCENT: 36.6 %L (ref 10–50)
POC MID %: 6.9 %M (ref 0–12)
Platelet Count, POC: 306 10*3/uL (ref 142–424)
RBC: 4.15 M/uL (ref 4.04–5.48)
RDW, POC: 13.3 %
WBC: 6.1 10*3/uL (ref 4.6–10.2)

## 2011-04-02 MED ORDER — LEVOTHYROXINE SODIUM 100 MCG PO TABS: 100.0000 ug | ORAL_TABLET | Freq: Every day | ORAL | Status: DC

## 2011-04-02 NOTE — Progress Notes (Signed)
This is an 78 year old woman from [place] who comes in with her daughter and husband. She speaks almost no [place]. She complains of headache and right eye pain. Has been intermittent problem for several years. She also complains of indigestion and left upper quadrant pain intermittently which is somewhat relieved with baking soda. She says that her legs are aching and weak after any activity. She also has chronic insomnia. She has a history of polymyalgia which was treated with steroids in the past and she's concerned that this come back.  Objective: No acute distress alert and cooperative.  Eyes: Floaters bilaterally  TMs normal  Neck supple without any adenopathy  Head: Normal some cephalic, nontender  Chest: Clear  Heart: Regular without murmur  Assessment: Chronically depressed with a possibility of recurrence of polymyalgia.  Plan: I have refilled her Fioricet, Prozac, zolpidem. And Synthroid  Her checking her yearly labs. Also checking her sedimentation rate

## 2011-04-04 ENCOUNTER — Other Ambulatory Visit: Payer: Self-pay | Admitting: Family Medicine

## 2011-04-04 DIAGNOSIS — M353 Polymyalgia rheumatica: Secondary | ICD-10-CM

## 2011-04-04 LAB — LIPID PANEL
Cholesterol: 316 mg/dL — ABNORMAL HIGH (ref 0–200)
HDL: 61 mg/dL (ref >39)
LDL Cholesterol: 214 mg/dL — ABNORMAL HIGH (ref 0–99)
Total CHOL/HDL Ratio: 5.2 Ratio
Triglycerides: 205 mg/dL — ABNORMAL HIGH (ref <150)
VLDL: 41 mg/dL — ABNORMAL HIGH (ref 0–40)

## 2011-04-04 LAB — SEDIMENTATION RATE: Sed Rate: 80 mm/hr — ABNORMAL HIGH (ref 0–22)

## 2011-04-04 LAB — BASIC METABOLIC PANEL
BUN: 27 mg/dL — ABNORMAL HIGH (ref 6–23)
CO2: 24 mEq/L (ref 19–32)
Calcium: 9.8 mg/dL (ref 8.4–10.5)
Chloride: 101 mEq/L (ref 96–112)
Creat: 1.49 mg/dL — ABNORMAL HIGH (ref 0.50–1.10)
Glucose, Bld: 104 mg/dL — ABNORMAL HIGH (ref 70–99)
Potassium: 4.9 mEq/L (ref 3.5–5.3)
Sodium: 137 mEq/L (ref 135–145)

## 2011-04-04 LAB — TSH: TSH: 2.528 u[IU]/mL (ref 0.350–4.500)

## 2011-04-04 MED ORDER — PREDNISONE 10 MG PO TABS: 10.0000 mg | ORAL_TABLET | Freq: Every day | ORAL | Status: DC

## 2011-04-20 ENCOUNTER — Ambulatory Visit (INDEPENDENT_AMBULATORY_CARE_PROVIDER_SITE_OTHER): Payer: Medicare Other | Admitting: Family Medicine

## 2011-04-20 ENCOUNTER — Encounter: Payer: Self-pay | Admitting: Family Medicine

## 2011-04-20 VITALS — BP 120/71 | HR 70 | Temp 97.5°F | Resp 16 | Ht 62.0 in | Wt 152.2 lb

## 2011-04-20 DIAGNOSIS — B370 Candidal stomatitis: Secondary | ICD-10-CM

## 2011-04-20 DIAGNOSIS — M353 Polymyalgia rheumatica: Secondary | ICD-10-CM

## 2011-04-20 DIAGNOSIS — E039 Hypothyroidism, unspecified: Secondary | ICD-10-CM

## 2011-04-20 LAB — TSH: TSH: 0.878 u[IU]/mL (ref 0.350–4.500)

## 2011-04-20 MED ORDER — MAGNESIUM OXIDE 400 MG PO TABS: 400.0000 mg | ORAL_TABLET | Freq: Every day | ORAL | Status: DC

## 2011-04-20 MED ORDER — PREDNISONE 1 MG PO TABS: ORAL_TABLET | ORAL | Status: DC

## 2011-04-20 MED ORDER — FLUCONAZOLE 150 MG PO TABS: 150.0000 mg | ORAL_TABLET | Freq: Once | ORAL | Status: AC

## 2011-04-20 NOTE — Progress Notes (Signed)
Is an 78 year old woman from [place] who comes in with polymyalgia. Recently put her back on prednisone 10 mg daily because of return o headache and right eye pain. Her sedimentation rate was elevated to 61 at the time. Since starting back on the prednisone her eyes symptoms have completely cleared and her headaches have subsided to a large extent. She still does have problems intermittently with headaches but they are largely related to emotional stress. She's starting to have weakness in her thighs over the past 6 days. She's on day 16 of prednisone.  Objective: HEENT: Mild erythema of the throat  Eyes: Normal fundi small cataract in the left  Neck: Supple, no bruits, no adenopathy  Head: Normocephalic, nontender  Chest: Clear  Heart: 1/6 systolic ejection type murmur no gallop.  Skin: No ecchymosis or rash  Assessment: Improvement on prednisone with early thrush and some early steroid myopathy, recent elevated TSH.  Plan try to taper the steroids to 8 mg daily and 6 then reduce by 1 mg per week each week over the next 8 weeks.  Diflucan 150x1, calcium supplementation, vitamin D supplementation, magnesium supplementation, recheck 1 month, check thyroid because of recent elevated TSH, check sedimentation rate. f

## 2011-04-21 LAB — SEDIMENTATION RATE: Sed Rate: 18 mm/hr (ref 0–22)

## 2011-05-05 ENCOUNTER — Ambulatory Visit (INDEPENDENT_AMBULATORY_CARE_PROVIDER_SITE_OTHER): Payer: Medicare Other | Admitting: Family Medicine

## 2011-05-05 VITALS — BP 117/68 | HR 69 | Temp 97.4°F | Resp 16

## 2011-05-05 DIAGNOSIS — F329 Major depressive disorder, single episode, unspecified: Secondary | ICD-10-CM

## 2011-05-05 DIAGNOSIS — R51 Headache: Secondary | ICD-10-CM | POA: Diagnosis not present

## 2011-05-05 DIAGNOSIS — F32A Depression, unspecified: Secondary | ICD-10-CM

## 2011-05-05 DIAGNOSIS — R5382 Chronic fatigue, unspecified: Secondary | ICD-10-CM

## 2011-05-05 DIAGNOSIS — R519 Headache, unspecified: Secondary | ICD-10-CM

## 2011-05-05 DIAGNOSIS — M353 Polymyalgia rheumatica: Secondary | ICD-10-CM | POA: Diagnosis not present

## 2011-05-05 MED ORDER — MAGNESIUM OXIDE 400 MG PO TABS: 400.0000 mg | ORAL_TABLET | Freq: Every day | ORAL | Status: DC

## 2011-05-05 NOTE — Progress Notes (Signed)
78 yo woman from [place] with polymyalgia, headaches, abdominal pain, sore muscles (thighs) and weakness for over a year.  We recently started Prozac at 10 mg and she is sleeping well, much happier with no crying spells, having no significant headaches or abdominal pain.  The sore, weak thighs (only muscle group bothering her) are her main complaint today, although she continues to take her daily walk.  The legs are bothering her when she stands to cook or walk, and they bother her primarily in the morning and when just starting to exercise, but pain lessens as she goes along.  She is now on 9 mg of prednisone daily.  Sed rate last week was at 80.  O:  Head nontender.  Patient is bright and cheerful, seen with daughter and husband.   Muscles are nontender with no significant wasting.  No edema. Skin normal. Spent over 35 minutes with conversation translation, reviewing life, etc.  A:  Much improved depression with decreasing somatic complaints.  I am hopeful that the myalgias will decrease as we reduce the prednisone (?steroid myopathy)  P:  Check sed rate Recheck patient 1 week. Reduce the prednisone to  8 mg weekly

## 2011-05-06 LAB — SEDIMENTATION RATE: Sed Rate: 34 mm/hr — ABNORMAL HIGH (ref 0–22)

## 2011-05-11 ENCOUNTER — Encounter: Payer: Self-pay | Admitting: Family Medicine

## 2011-05-11 ENCOUNTER — Ambulatory Visit (INDEPENDENT_AMBULATORY_CARE_PROVIDER_SITE_OTHER): Payer: Medicare Other | Admitting: Family Medicine

## 2011-05-11 VITALS — BP 135/77 | HR 78 | Temp 98.1°F | Resp 16 | Ht 62.0 in | Wt 155.0 lb

## 2011-05-11 DIAGNOSIS — M353 Polymyalgia rheumatica: Secondary | ICD-10-CM

## 2011-05-11 MED ORDER — PREDNISONE 1 MG PO TABS: ORAL_TABLET | ORAL | Status: DC

## 2011-05-11 MED ORDER — PREDNISONE 5 MG PO TABS: 5.0000 mg | ORAL_TABLET | Freq: Every day | ORAL | Status: DC

## 2011-05-11 NOTE — Progress Notes (Signed)
78 yo [place] woman seen with daughter and husband for followup on polymyalgia rheumatica.  She feels more tired, but headaches and sleep are still not a problem.  She exercises 20 minutes bid and is taking 8 mg Prednisone daily. Some palpitations unrelated to activity. No gi complaints  O: alert, smiling Heart: loud S2, early syst. Murmur, no gallop Lungs clear Ext: trace edema  I spent  20 minutes reviewing life issues.  A:  Fatigue with prednisone taper.  Otherwise doing reasonably well.  Family anxious and needs weekly reassurance.  P:  Alternate the pred 7 with 8 daily for the next week. Stop the magnesium and daily aspirin. followup in one week.

## 2011-05-19 ENCOUNTER — Ambulatory Visit (INDEPENDENT_AMBULATORY_CARE_PROVIDER_SITE_OTHER): Payer: Medicare Other | Admitting: Family Medicine

## 2011-05-19 VITALS — BP 124/70 | HR 68 | Temp 98.2°F | Resp 16 | Ht 63.0 in | Wt 154.4 lb

## 2011-05-19 DIAGNOSIS — M353 Polymyalgia rheumatica: Secondary | ICD-10-CM | POA: Diagnosis not present

## 2011-05-19 MED ORDER — PREDNISONE 1 MG PO TABS: ORAL_TABLET | ORAL | Status: DC

## 2011-05-19 MED ORDER — PREDNISONE 5 MG PO TABS: 5.0000 mg | ORAL_TABLET | Freq: Every day | ORAL | Status: DC

## 2011-05-19 NOTE — Progress Notes (Signed)
This 78 year old woman from [place] comes in with her daughter and husband. She's been doing with a recurrence of polymyalgia for 2 months now and is being tapered off of her prednisone. This last week she was taking 7 mg and 8 mg of prednisone alternating daily.  Overall she's had no new symptoms. Headaches are stable she's had no visual problems. The muscle aches that she's complained about in her thighs are less and occasionally she has low back pain. She's gained 4 pounds but has had no edema, shortness of breath or chest pain. She's sleeping well and overall enjoying her life  Objective: Lungs clear, heart regular no murmur  Extremities: No edema, no ecchymosis  Assessment: Stable on tapering doses of prednisone  Plan: Followup 2 weeks, prednisone 7 mg daily this next week and then alternate 7 mg with 8 mg every other day  Check sedimentation rate today

## 2011-05-19 NOTE — Patient Instructions (Signed)
Polymyalgia Rheumatica Polymyalgia rheumatica (also called PMR or polymyalgia) is a rheumatologic (arthritic) condition that causes pain and morning stiffness in your neck, shoulders, and hips. It is an inflammatory condition. In some people, inflammation of certain structures in the shoulder, hips, or other joints can be seen on special testing. It does not cause joint destruction, as occurs in other arthritic conditions. It usually occurs after 76 years of age, and is more common as you age. It can be confused with several other diseases, but it is usually easily treated. People with PMR often have, or can develop, a more severe rheumatologic condition called giant cell arteritis (also called CGA or temporal arteritis).  CAUSES  The exact cause of PMR is not known.   There are genetic factors involved.   Viruses have been suspected in the cause of PMR. This has not been proven.  SYMPTOMS   Aching, pain, and morning stiffness your neck, both shoulders, or both hips.   Symptoms usually start slowly and build gradually.   Morning stiffness usually lasts at least 30 minutes.   Swelling and tenderness in other joints of the arms, hands, legs, and feet may occur.   Swelling and inflammation in the wrists can cause nerve inflammation at the wrist (carpal tunnel syndrome).   You may also have low grade fever, fatigue, weakness, decreased appetite and weight loss.  DIAGNOSIS   Your caregiver may suspect that you have PMR based on your description of your symptoms and on your exam.   Your caregiver will examine you to be sure you do not have diseases that can be confused with PMR. These diseases include rheumatoid arthritis, fibromyalgia, or thyroid disease.   Your caregiver should check for signs of giant cell arteritis. This can cause serious complications such as blindness.   Lab tests can help confirm that you have PMR and not other diseases, but are sometimes inconclusive.   X-rays cannot  show PMR. However, it can identify other diseases like rheumatoid arthritis. Your caregiver may have you see a specialist in arthritis and inflammatory diseases (rheumatologist).  TREATMENT  The goal of treatment is relief of symptoms. Treatment does not shorten the course of the illness or prevent complications. With proper treatment, you usually feel better almost right away.   The initial treatment of PMR is usually cortisone (steroid) medication, such as prednisone or prednisolone. Your caregiver will help determine a starting dose, which is usually a low to moderate dose. The dose is gradually reduced every few weeks to months. Treatment usually lasts one to three years.   Other stronger medications are rarely needed. They will only be prescribed if your symptoms do not get better on cortisone medication alone, or if they recur as the dose is reduced.   Cortisone medication can have different side effects. With the doses of cortisone needed for PMR, the side effects are usually mild. Discuss this with your caregiver.   Your caregiver will evaluate you regularly during your treatment. They will do this in order to assess progress and to check for complications of the illness or treatment.   Physical therapy is sometimes useful. This is especially true if your joints are still stiff after other symptoms have improved.  HOME CARE INSTRUCTIONS   Follow your caregiver's instructions. Do not change your dose of cortisone medication on your own.   Keep your appointments for follow-up lab tests and caregiver visits. Your lab tests need to be monitored. You must get checked periodically for giant   cell arteritis.   Follow your caregiver's guidance regarding physical activity (usually no restrictions are needed) or physical therapy.   Your caregiver may have instructions to prevent or check for side effects from cortisone medication (including bone density testing or treatment). Follow their  instructions carefully.  SEEK MEDICAL CARE IF:   You develop any side effects from treatment. Side effects can include:   Elevated blood pressure.   High blood sugar (or worsening of diabetes, if you are diabetic).   Difficulty fighting off infections.   Weight gain.   Weakness of the bones (osteoporosis).   Your aches, pains, morning stiffness, or other symptoms get worse with time. This is especially true after your dose of cortisone is reduced.   You develop new joint symptoms (pain, swelling, etc.)  SEEK IMMEDIATE MEDICAL CARE IF:   You develop a severe headache.   You start vomiting.   You have problems with your vision.   You have an oral temperature above 102 F (38.9 C), not controlled by medicine.  Document Released: 03/02/2004 Document Revised: 01/12/2011 Document Reviewed: 06/15/2008 Exit[employer] Patient Information 2012 [employer]. 

## 2011-05-20 LAB — SEDIMENTATION RATE: Sed Rate: 12 mm/h (ref 0–22)

## 2011-05-23 ENCOUNTER — Encounter: Payer: Self-pay | Admitting: *Deleted

## 2011-05-25 ENCOUNTER — Ambulatory Visit: Payer: Medicare Other | Admitting: Family Medicine

## 2011-05-26 DIAGNOSIS — N814 Uterovaginal prolapse, unspecified: Secondary | ICD-10-CM | POA: Diagnosis not present

## 2011-05-30 ENCOUNTER — Encounter: Payer: Self-pay | Admitting: Family Medicine

## 2011-05-30 ENCOUNTER — Ambulatory Visit (INDEPENDENT_AMBULATORY_CARE_PROVIDER_SITE_OTHER): Payer: Medicare Other | Admitting: Family Medicine

## 2011-05-30 VITALS — BP 125/72 | HR 66 | Temp 97.7°F | Resp 16 | Ht 62.0 in | Wt 153.4 lb

## 2011-05-30 DIAGNOSIS — M353 Polymyalgia rheumatica: Secondary | ICD-10-CM

## 2011-05-30 DIAGNOSIS — F329 Major depressive disorder, single episode, unspecified: Secondary | ICD-10-CM

## 2011-05-30 DIAGNOSIS — IMO0001 Reserved for inherently not codable concepts without codable children: Secondary | ICD-10-CM

## 2011-05-30 DIAGNOSIS — G8929 Other chronic pain: Secondary | ICD-10-CM

## 2011-05-30 DIAGNOSIS — G4700 Insomnia, unspecified: Secondary | ICD-10-CM

## 2011-05-30 DIAGNOSIS — F32A Depression, unspecified: Secondary | ICD-10-CM

## 2011-05-30 NOTE — Progress Notes (Signed)
78 yo [place] woman with polymyalgia rheumatica, insomnia, and chronic headaches seen with husband and daughter, who interprets for her. Currently she is alternating Prednisone 6 with 7 mg every other day.  She still feels a little weak, but is walking 40 minutes daily.  She is sleeping reasonably well and her headaches are not severe.  They occur every day, usually later in the day, over the right eye and on the top of the head.  No nausea or visual disturbances.  O:  I spent 45 minutes with the family, reviewing where she has been and the need for slow taper of the prednisone.  Anita Banks explains that this is the best her mother has been in years. And indeed, Anita Banks seems relaxed and smiles easily. Her exam is unchanged.  A:  Continued progress toward recovery of polymyalgia.  Depression is lifting.  P: I have asked the family to come back two weeks from tomorrow so I can answer many of the questions that Anita Banks raised today, and make sure the Prednisone taper is proceeding without complication.

## 2011-06-14 ENCOUNTER — Ambulatory Visit (INDEPENDENT_AMBULATORY_CARE_PROVIDER_SITE_OTHER): Payer: Medicare Other | Admitting: Family Medicine

## 2011-06-14 VITALS — BP 141/74 | HR 75 | Temp 97.9°F | Resp 16 | Ht 62.0 in | Wt 152.6 lb

## 2011-06-14 DIAGNOSIS — M6281 Muscle weakness (generalized): Secondary | ICD-10-CM

## 2011-06-14 DIAGNOSIS — IMO0001 Reserved for inherently not codable concepts without codable children: Secondary | ICD-10-CM | POA: Diagnosis not present

## 2011-06-14 DIAGNOSIS — M353 Polymyalgia rheumatica: Secondary | ICD-10-CM

## 2011-06-14 DIAGNOSIS — G8929 Other chronic pain: Secondary | ICD-10-CM

## 2011-06-14 NOTE — Progress Notes (Signed)
78 yo woman from [place] seen with husband and daughter.  Her issues today are  1. Follow up of polymyalgia:  Feeling better than 2 weeks ago alternating 5 with 6 mg Prednisone  2. Chronic headaches:  Less than 2 weeks ago, sleeping well, better sense of well-being  3. Muscle weakness lower extremities:  Less apparent.  Still worse in am  O:  Cheerful, looks happy HEENT:  Unremarkable Chest:  Clear Heart:  Reg, no murmur  A:  Doing well P:  Lower to 5 mg until Monday,  Then alternate with 4 mg x 1 week, then drop to 4 mg qd  Until next visit in 2 weeks.

## 2011-06-28 ENCOUNTER — Encounter: Payer: Self-pay | Admitting: Family Medicine

## 2011-06-28 ENCOUNTER — Ambulatory Visit: Payer: Medicare Other | Admitting: Family Medicine

## 2011-06-28 VITALS — BP 126/77 | HR 66 | Temp 98.0°F | Resp 16 | Ht 63.5 in | Wt 153.8 lb

## 2011-06-28 DIAGNOSIS — M353 Polymyalgia rheumatica: Secondary | ICD-10-CM

## 2011-06-28 DIAGNOSIS — G8929 Other chronic pain: Secondary | ICD-10-CM

## 2011-06-28 DIAGNOSIS — M6281 Muscle weakness (generalized): Secondary | ICD-10-CM

## 2011-06-28 MED ORDER — CALCIUM CARBONATE-VITAMIN D 500-200 MG-UNIT PO TABS: 1.0000 | ORAL_TABLET | Freq: Every day | ORAL | Status: DC

## 2011-06-28 NOTE — Progress Notes (Signed)
This is an 78 year old woman from [place] comes in with her daughter in followup for polymyalgia, proximal lower extremity weakness, and chronic headaches. She's currently taking 4 mg of prednisone daily for polymyalgia. She's noted no increasing muscle soreness as she is tapering down. She still has some headaches but overall these are not serious or interfering with her activity. The lower extremity weakness has been a persisting problem but she does feel strong and steady on her feet, and she is walking several times a day up to a half a mile at a time.  She and her daughter think that some of her headaches are due to stress. She is to watch her husband constantly to make sure he takes his medicine and make sure he gets out and walks as well. She's sleeping fairly well. Her appetite is good. She has no new chest pain, shortness of breath, abdominal pain, or fevers.  Objective: Elderly woman in no distress. She is smiling which is always nice to see. She does not appear particularly anxious at this visit.  HEENT: Unremarkable  Chest: Clear  Heart: Regular with 1/6 systolic murmur and a S3 gallop.  Extremities show no edema with no evidence of arthritis.  Skin is clear of rashes  Assessment: 1. Polymyalgia rheumatica   2. Chronic headaches   3. Muscle weakness       I think this patient is doing much better every week. She will continue to taper her prednisone by 1/2 mg per day per week. I'll see her again in 3 weeks.  She is instructed to continue her daily walks. I will make a home visit    Plan:   Patient Instructions  Continue to taper the prednisone by 1/2 mg per day per week.

## 2011-06-28 NOTE — Patient Instructions (Signed)
Continue to taper the prednisone by 1/2 mg per day per week.

## 2011-06-29 ENCOUNTER — Other Ambulatory Visit: Payer: Self-pay | Admitting: Family Medicine

## 2011-07-04 ENCOUNTER — Other Ambulatory Visit: Payer: Self-pay | Admitting: Family Medicine

## 2011-07-21 ENCOUNTER — Other Ambulatory Visit: Payer: Self-pay | Admitting: Family Medicine

## 2011-07-21 DIAGNOSIS — I10 Essential (primary) hypertension: Secondary | ICD-10-CM

## 2011-07-21 MED ORDER — LISINOPRIL 20 MG PO TABS: 20.0000 mg | ORAL_TABLET | Freq: Every day | ORAL | Status: DC

## 2011-08-04 ENCOUNTER — Other Ambulatory Visit: Payer: Self-pay | Admitting: Family Medicine

## 2011-08-04 DIAGNOSIS — I10 Essential (primary) hypertension: Secondary | ICD-10-CM

## 2011-08-04 MED ORDER — LISINOPRIL 10 MG PO TABS: ORAL_TABLET | ORAL | Status: DC

## 2011-08-18 ENCOUNTER — Encounter: Payer: Self-pay | Admitting: Family Medicine

## 2011-08-18 ENCOUNTER — Ambulatory Visit: Payer: Medicare Other

## 2011-08-18 ENCOUNTER — Ambulatory Visit (INDEPENDENT_AMBULATORY_CARE_PROVIDER_SITE_OTHER): Payer: Medicare Other | Admitting: Family Medicine

## 2011-08-18 VITALS — BP 160/73 | HR 67 | Temp 97.0°F | Resp 22 | Ht 63.0 in | Wt 150.0 lb

## 2011-08-18 DIAGNOSIS — M549 Dorsalgia, unspecified: Secondary | ICD-10-CM

## 2011-08-18 DIAGNOSIS — M545 Low back pain, unspecified: Secondary | ICD-10-CM

## 2011-08-18 MED ORDER — HYDROCODONE-ACETAMINOPHEN 5-500 MG PO TABS: 1.0000 | ORAL_TABLET | Freq: Three times a day (TID) | ORAL | Status: DC | PRN

## 2011-08-18 MED ORDER — MELOXICAM 7.5 MG PO TABS: 7.5000 mg | ORAL_TABLET | Freq: Two times a day (BID) | ORAL | Status: AC

## 2011-08-18 NOTE — Progress Notes (Signed)
78 yo woman from former [place] [employer] with progressive back pain.  She initially had mild pain 5 days ago, but after getting into car on Wednesday night, pain worsened.  Now the pain is intense and located in low lumbar area with radiating into left hip and groin. No weakness.  No pain in lower leg.  No problem with bowels or bladder.   She has completed the prednisone.  Objective:  NAD Straight leg raising:  Normal Good movement of leg Nontender back Small ecchymosis over left CVA area. Reflexes:  Normal KJ, AJ [facility] reading (PRIMARY) by  Dr. Milus Glazier: lumbar spine arthritis.    Assessment: back strain  Plan:   1. Back pain  DG Lumbar Spine 2-3 Views, HYDROcodone-acetaminophen (VICODIN) 5-500 MG per tablet, meloxicam (MOBIC) 7.5 MG tablet

## 2011-08-28 DIAGNOSIS — M549 Dorsalgia, unspecified: Secondary | ICD-10-CM | POA: Diagnosis not present

## 2011-08-28 DIAGNOSIS — N814 Uterovaginal prolapse, unspecified: Secondary | ICD-10-CM | POA: Diagnosis not present

## 2011-09-01 ENCOUNTER — Encounter: Payer: Self-pay | Admitting: Gastroenterology

## 2011-09-06 ENCOUNTER — Other Ambulatory Visit: Payer: Self-pay | Admitting: Family Medicine

## 2011-09-06 DIAGNOSIS — E039 Hypothyroidism, unspecified: Secondary | ICD-10-CM

## 2011-09-06 MED ORDER — LEVOTHYROXINE SODIUM 100 MCG PO TABS: 100.0000 ug | ORAL_TABLET | Freq: Every day | ORAL | Status: DC

## 2011-09-11 ENCOUNTER — Ambulatory Visit (INDEPENDENT_AMBULATORY_CARE_PROVIDER_SITE_OTHER): Payer: Medicare Other | Admitting: Family Medicine

## 2011-09-11 ENCOUNTER — Encounter: Payer: Self-pay | Admitting: Family Medicine

## 2011-09-11 VITALS — BP 130/78 | HR 72 | Resp 16

## 2011-09-11 DIAGNOSIS — M545 Low back pain, unspecified: Secondary | ICD-10-CM | POA: Diagnosis not present

## 2011-09-11 DIAGNOSIS — M170 Bilateral primary osteoarthritis of knee: Secondary | ICD-10-CM

## 2011-09-11 DIAGNOSIS — M1710 Unilateral primary osteoarthritis, unspecified knee: Secondary | ICD-10-CM

## 2011-09-11 MED ORDER — METHYLPREDNISOLONE ACETATE 80 MG/ML IJ SUSP
80.0000 mg | Freq: Once | INTRAMUSCULAR | Status: AC
  Administered 2011-09-11: 80 mg via INTRA_ARTICULAR

## 2011-09-11 NOTE — Progress Notes (Signed)
78 yo woman with chronic knee pain who has benefitted from injection to knee in the past.  Her left knee is flaring up again  Her back has improved since last visit, although she still has some soreness when bending or twisting.  O: NAD Sterile prep to left knee 1 cc marcaine and 1/2 cc depomedrol injected into medial knee without complication Moderate relief with stable gait on leaving  SLR negative Good leg strength and no numbness.  A:  Knee arthritis Back strain, resolving  P: exercises reviewed Suggested that her diet is rich in calcium containing foods and she does not need calcium supplements She will try glucosamine for a month and discontinue if it does not appear to be working.

## 2011-10-06 ENCOUNTER — Other Ambulatory Visit: Payer: Self-pay

## 2011-10-06 MED ORDER — ZOLPIDEM TARTRATE 10 MG PO TABS: 10.0000 mg | ORAL_TABLET | Freq: Every evening | ORAL | Status: DC | PRN

## 2011-10-20 ENCOUNTER — Ambulatory Visit (INDEPENDENT_AMBULATORY_CARE_PROVIDER_SITE_OTHER): Payer: Medicare Other | Admitting: Family Medicine

## 2011-10-20 VITALS — BP 155/87 | HR 71 | Temp 97.8°F | Resp 16

## 2011-10-20 DIAGNOSIS — M25562 Pain in left knee: Secondary | ICD-10-CM

## 2011-10-20 DIAGNOSIS — M25569 Pain in unspecified knee: Secondary | ICD-10-CM

## 2011-10-20 DIAGNOSIS — M79609 Pain in unspecified limb: Secondary | ICD-10-CM | POA: Diagnosis not present

## 2011-10-20 DIAGNOSIS — R51 Headache: Secondary | ICD-10-CM

## 2011-10-20 DIAGNOSIS — R519 Headache, unspecified: Secondary | ICD-10-CM

## 2011-10-20 MED ORDER — KETOROLAC TROMETHAMINE 10 MG PO TABS: ORAL_TABLET | ORAL | Status: DC

## 2011-10-20 MED ORDER — METHYLPREDNISOLONE 4 MG PO TABS: ORAL_TABLET | ORAL | Status: DC

## 2011-10-20 NOTE — Progress Notes (Signed)
78 year old woman from [place] who comes in today with her husband and daughter. She has pain in her left knee which is persisting despite a cortisone injection. She also has daily headaches and the butalbital is no longer working well for her. She is walking daily and is having no disability from the knee pain but it is annoying.  Objective: Full range of motion left knee with minimal crepitus, synovial thickening is present, no effusion, ligaments intact HEENT unremarkable Alert and cooperative Assessment: Persistent arthritis of knee with synovial thickening, chronic headaches  Plan: 1. Knee pain, left  methylPREDNISolone (MEDROL) 4 MG tablet  2. Headache disorder  ketorolac (TORADOL) 10 MG tablet

## 2011-11-24 ENCOUNTER — Other Ambulatory Visit: Payer: Self-pay | Admitting: Family Medicine

## 2011-11-24 DIAGNOSIS — E039 Hypothyroidism, unspecified: Secondary | ICD-10-CM

## 2011-11-24 MED ORDER — LEVOTHYROXINE SODIUM 125 MCG PO TABS: 125.0000 ug | ORAL_TABLET | Freq: Every day | ORAL | Status: DC

## 2011-11-28 ENCOUNTER — Ambulatory Visit (INDEPENDENT_AMBULATORY_CARE_PROVIDER_SITE_OTHER): Payer: Medicare Other | Admitting: Family Medicine

## 2011-11-28 VITALS — BP 134/74 | HR 70 | Temp 97.9°F | Resp 16

## 2011-11-28 DIAGNOSIS — Z23 Encounter for immunization: Secondary | ICD-10-CM

## 2011-11-28 DIAGNOSIS — R51 Headache: Secondary | ICD-10-CM | POA: Diagnosis not present

## 2011-11-28 DIAGNOSIS — M25569 Pain in unspecified knee: Secondary | ICD-10-CM | POA: Diagnosis not present

## 2011-11-28 DIAGNOSIS — M25562 Pain in left knee: Secondary | ICD-10-CM

## 2011-11-28 DIAGNOSIS — E785 Hyperlipidemia, unspecified: Secondary | ICD-10-CM

## 2011-11-28 DIAGNOSIS — K219 Gastro-esophageal reflux disease without esophagitis: Secondary | ICD-10-CM

## 2011-11-28 DIAGNOSIS — G8929 Other chronic pain: Secondary | ICD-10-CM

## 2011-11-28 LAB — COMPREHENSIVE METABOLIC PANEL
ALT: 14 U/L (ref 0–35)
AST: 20 U/L (ref 0–37)
Albumin: 4 g/dL (ref 3.5–5.2)
Alkaline Phosphatase: 68 U/L (ref 39–117)
BUN: 30 mg/dL — ABNORMAL HIGH (ref 6–23)
CO2: 25 mEq/L (ref 19–32)
Calcium: 9.4 mg/dL (ref 8.4–10.5)
Chloride: 106 mEq/L (ref 96–112)
Creat: 1.38 mg/dL — ABNORMAL HIGH (ref 0.50–1.10)
Glucose, Bld: 79 mg/dL (ref 70–99)
Potassium: 4.5 mEq/L (ref 3.5–5.3)
Sodium: 139 mEq/L (ref 135–145)
Total Bilirubin: 0.2 mg/dL — ABNORMAL LOW (ref 0.3–1.2)
Total Protein: 7.2 g/dL (ref 6.0–8.3)

## 2011-11-28 LAB — CBC
HCT: 34 % — ABNORMAL LOW (ref 36.0–46.0)
Hemoglobin: 11.2 g/dL — ABNORMAL LOW (ref 12.0–15.0)
MCH: 28.9 pg (ref 26.0–34.0)
MCHC: 32.9 g/dL (ref 30.0–36.0)
MCV: 87.9 fL (ref 78.0–100.0)
Platelets: 285 10*3/uL (ref 150–400)
RBC: 3.87 MIL/uL (ref 3.87–5.11)
RDW: 13.4 % (ref 11.5–15.5)
WBC: 5.6 10*3/uL (ref 4.0–10.5)

## 2011-11-28 LAB — LIPID PANEL
Cholesterol: 254 mg/dL — ABNORMAL HIGH (ref 0–200)
HDL: 50 mg/dL (ref >39)
LDL Cholesterol: 153 mg/dL — ABNORMAL HIGH (ref 0–99)
Total CHOL/HDL Ratio: 5.1 Ratio
Triglycerides: 257 mg/dL — ABNORMAL HIGH (ref <150)
VLDL: 51 mg/dL — ABNORMAL HIGH (ref 0–40)

## 2011-11-28 MED ORDER — DICLOFENAC SODIUM 1 % TD GEL: 2.0000 g | Freq: Four times a day (QID) | TRANSDERMAL | Status: DC

## 2011-11-28 MED ORDER — SUCRALFATE 1 GM/10ML PO SUSP: 1.0000 g | Freq: Three times a day (TID) | ORAL | Status: DC

## 2011-11-28 MED ORDER — INFLUENZA VIRUS VACC SPLIT PF IM SUSP: 0.5000 mL | Freq: Once | INTRAMUSCULAR | Status: DC

## 2011-11-28 NOTE — Progress Notes (Signed)
This is an 78 year old woman who immigrated from [place] approximately 12 years ago. She still doesn't speaking English very well. Her daughter is in the room translating for her and she is accompanied by her husband.  Patient has several chronic problems: 1 chronic daily headaches which vary in intensity from day to day and fluctuate during the day. He also was started on the right side but occasionally migrate to the left. The precipitated with visual concentration such as reading or prolonged TV watching. She's tried multiple different medicines for this none of which have really made much difference. She is a lot happier on the Prozac since we started this several months ago.  Family is considering acupuncture which seems reasonable given the fact that multiple medicines have failed to improve this problem. She's had no new headache symptoms or neurological signs  2 left knee pain has continued but is not as intense. She describes the pain is on the left lateral side of the knee and radiates up the left lateral thigh. We tried an injection of the knee which seemed to help her while but the pain has come back and she is able to continue walking daily but the pain is annoying her. She sometimes uses a wrap which affords some relief.  3 patient describes reflux burning approximately 2 hours after eating. She's been on a variety of PPI medicines and they're not working for her now. She's tried Carafate in the past and it worked for her for while.  4 patient describes a soreness in her left wrist just proximal to the base of the thumb which has been bothering her for the last 24 hours after using her left arm more than usual yesterday. She's not having from moving it and has no numbness or tenderness with palpation  Objective: Patient is pleasant, smiles easily and is cooperative Skin: No new lesions or rashes Left wrist: Nontender with full range of motion and no swelling Left knee shows full range  of motion, no tenderness, no swelling, no overlying erythema Chest: Clear Heart: Regular with 2/6 midsystolic murmur best heard over the sternum and left sternal border, no gallop or rub Abdomen: Mildly tender left upper quadrant, no masses, no HSM  Assessment: 78 year old woman with history of polymyalgia comes in with several chronic problems none of which seem to change much over the last few months.  1. Knee pain, left  diclofenac sodium (VOLTAREN) 1 % GEL, CBC, Comprehensive metabolic panel, Sedimentation Rate  2. Chronic headaches  CBC, Comprehensive metabolic panel  3. GERD (gastroesophageal reflux disease)  sucralfate (CARAFATE) 1 GM/10ML suspension, CBC  4. Hyperlipemia  CBC, Comprehensive metabolic panel, Lipid panel   Flu shot today

## 2011-11-29 LAB — SEDIMENTATION RATE: Sed Rate: 40 mm/hr — ABNORMAL HIGH (ref 0–22)

## 2011-12-11 DIAGNOSIS — Z01419 Encounter for gynecological examination (general) (routine) without abnormal findings: Secondary | ICD-10-CM | POA: Diagnosis not present

## 2011-12-11 DIAGNOSIS — Z124 Encounter for screening for malignant neoplasm of cervix: Secondary | ICD-10-CM | POA: Diagnosis not present

## 2011-12-12 ENCOUNTER — Other Ambulatory Visit: Payer: Self-pay | Admitting: Obstetrics & Gynecology

## 2011-12-12 DIAGNOSIS — M810 Age-related osteoporosis without current pathological fracture: Secondary | ICD-10-CM

## 2011-12-12 DIAGNOSIS — Z1231 Encounter for screening mammogram for malignant neoplasm of breast: Secondary | ICD-10-CM

## 2011-12-26 ENCOUNTER — Other Ambulatory Visit: Payer: Self-pay | Admitting: Family Medicine

## 2012-01-14 ENCOUNTER — Encounter: Payer: Self-pay | Admitting: Family Medicine

## 2012-01-14 ENCOUNTER — Ambulatory Visit (INDEPENDENT_AMBULATORY_CARE_PROVIDER_SITE_OTHER): Payer: Medicare Other | Admitting: Family Medicine

## 2012-01-14 VITALS — BP 167/78 | HR 64 | Temp 97.5°F | Resp 16 | Ht 62.0 in | Wt 150.6 lb

## 2012-01-14 DIAGNOSIS — R51 Headache: Secondary | ICD-10-CM

## 2012-01-14 DIAGNOSIS — M3320 Polymyositis, organ involvement unspecified: Secondary | ICD-10-CM

## 2012-01-14 DIAGNOSIS — M353 Polymyalgia rheumatica: Secondary | ICD-10-CM

## 2012-01-14 DIAGNOSIS — R519 Headache, unspecified: Secondary | ICD-10-CM

## 2012-01-14 LAB — POCT CBC
Granulocyte percent: 60 %G (ref 37–80)
HCT, POC: 38.7 % (ref 37.7–47.9)
Hemoglobin: 11.9 g/dL — AB (ref 12.2–16.2)
Lymph, poc: 2.2 (ref 0.6–3.4)
MCH, POC: 27.8 pg (ref 27–31.2)
MCHC: 30.7 g/dL — AB (ref 31.8–35.4)
MCV: 90.5 fL (ref 80–97)
MID (cbc): 0.5 (ref 0–0.9)
MPV: 8.2 fL (ref 0–99.8)
POC Granulocyte: 4 (ref 2–6.9)
POC LYMPH PERCENT: 32.2 %L (ref 10–50)
POC MID %: 7.8 %M (ref 0–12)
Platelet Count, POC: 318 10*3/uL (ref 142–424)
RBC: 4.28 M/uL (ref 4.04–5.48)
RDW, POC: 14.2 %
WBC: 6.7 10*3/uL (ref 4.6–10.2)

## 2012-01-14 NOTE — Progress Notes (Signed)
This is an 78 year old woman from [place] who has a long history of headaches. Over the last month the headaches have gotten worse and if stable each side of the head fluctuating from one side to the other. She also has pain around her right eye which is made worse by reading. He's had no nausea or vomiting, she's had no diplopia, no fever, no difficulty swallowing or change in hearing. Her diet is good. She walks twice a day.  She seen with her daughter who also has chronic headaches, her husband who is in failing health, and son-in-law.. Husband was in the emergency room from 10 PM to 6 AM on Friday night to Saturday morning  Patient has had polymyalgia in the past and is worried that this may be coming back. She does have pain in her left knee and no other muscle pains. She also has a intermittent sensation her left upper quadrant. He's had no shortness of breath or chest pain.  Objective: Patient appears in no acute distress but she does appear to be depressed. HEENT unremarkable Heart: Regular with 1/6 systolic ejection type murmur Neck: No bruits, supple, no adenopathy or thyromegaly Chest is clear Abdomen: Soft nontender without HSM or masses Skin: Normal Neurological exam: Normal cranial nerves, motor exam.  I spent 40 minutes face-to-face with the entire family discussing his problems   Assessment: Chronic headaches with history of polymyalgia. The patient is depressed and stressed with her husband's health problems.  Plan: 1. Headache above the eye region  POCT CBC, Comprehensive metabolic panel, Sedimentation Rate  2. Polymyalgia  POCT CBC, Comprehensive metabolic panel, Sedimentation Rate   Family would like to try acupuncture.  I will see if medicare will cover

## 2012-01-15 LAB — COMPREHENSIVE METABOLIC PANEL
ALT: 17 U/L (ref 0–35)
AST: 22 U/L (ref 0–37)
Albumin: 4.1 g/dL (ref 3.5–5.2)
Alkaline Phosphatase: 68 U/L (ref 39–117)
BUN: 24 mg/dL — ABNORMAL HIGH (ref 6–23)
CO2: 23 mEq/L (ref 19–32)
Calcium: 9.8 mg/dL (ref 8.4–10.5)
Chloride: 106 mEq/L (ref 96–112)
Creat: 1.19 mg/dL — ABNORMAL HIGH (ref 0.50–1.10)
Glucose, Bld: 83 mg/dL (ref 70–99)
Potassium: 4.8 mEq/L (ref 3.5–5.3)
Sodium: 139 mEq/L (ref 135–145)
Total Bilirubin: 0.2 mg/dL — ABNORMAL LOW (ref 0.3–1.2)
Total Protein: 7.2 g/dL (ref 6.0–8.3)

## 2012-01-15 LAB — SEDIMENTATION RATE: Sed Rate: 27 mm/hr — ABNORMAL HIGH (ref 0–22)

## 2012-01-19 ENCOUNTER — Ambulatory Visit
Admission: RE | Admit: 2012-01-19 | Discharge: 2012-01-19 | Disposition: A | Discharge status: Home / Self Care | Payer: Medicare Other | Source: Ambulatory Visit | Attending: Obstetrics & Gynecology | Admitting: Obstetrics & Gynecology

## 2012-01-19 DIAGNOSIS — Z1231 Encounter for screening mammogram for malignant neoplasm of breast: Secondary | ICD-10-CM

## 2012-01-19 DIAGNOSIS — M810 Age-related osteoporosis without current pathological fracture: Secondary | ICD-10-CM

## 2012-01-31 ENCOUNTER — Other Ambulatory Visit: Payer: Self-pay | Admitting: Family Medicine

## 2012-02-19 DIAGNOSIS — N760 Acute vaginitis: Secondary | ICD-10-CM | POA: Diagnosis not present

## 2012-02-19 DIAGNOSIS — N814 Uterovaginal prolapse, unspecified: Secondary | ICD-10-CM | POA: Diagnosis not present

## 2012-02-28 DIAGNOSIS — N814 Uterovaginal prolapse, unspecified: Secondary | ICD-10-CM | POA: Diagnosis not present

## 2012-02-29 ENCOUNTER — Ambulatory Visit (INDEPENDENT_AMBULATORY_CARE_PROVIDER_SITE_OTHER): Payer: Medicare Other | Admitting: Family Medicine

## 2012-02-29 ENCOUNTER — Encounter: Payer: Self-pay | Admitting: Family Medicine

## 2012-02-29 VITALS — BP 126/69 | HR 76 | Temp 97.7°F | Resp 16 | Ht 62.0 in | Wt 149.0 lb

## 2012-02-29 DIAGNOSIS — I10 Essential (primary) hypertension: Secondary | ICD-10-CM | POA: Diagnosis not present

## 2012-02-29 DIAGNOSIS — R002 Palpitations: Secondary | ICD-10-CM

## 2012-02-29 DIAGNOSIS — F329 Major depressive disorder, single episode, unspecified: Secondary | ICD-10-CM

## 2012-02-29 DIAGNOSIS — F32A Depression, unspecified: Secondary | ICD-10-CM

## 2012-02-29 LAB — SEDIMENTATION RATE: Sed Rate: 38 mm/hr — ABNORMAL HIGH (ref 0–22)

## 2012-02-29 MED ORDER — LISINOPRIL 10 MG PO TABS: ORAL_TABLET | ORAL | Status: DC

## 2012-02-29 MED ORDER — FLUOXETINE HCL 10 MG PO TABS: 10.0000 mg | ORAL_TABLET | Freq: Every day | ORAL | Status: DC

## 2012-02-29 NOTE — Progress Notes (Signed)
Is an 79 year old woman from [place], [place] who comes in complaining about 3 months of intermittent palpitations. She states that typically these rapid heart beats will occur an hour after arising in the morning and sometimes later in the day and last perhaps a half an hour to an hour. Associated with some mild shortness of breath and weakness. She wonders if it's because of a thyroid condition which she's had in the past or perhaps a recurrence of the fibromyalgia. She has no chest pain, diaphoresis, nausea or vomiting.  Objective: Patient's alert and accompanied by her daughter is helping to translate [place]. HEENT: Negative Neck: Supple, no bruit, no thyromegaly Chest: Clear Heart: Regular at about 80 beats per minute with no murmur but she does have an S4 gallop at the left sternal border. Extremities: No edema BP 120/70  EKG:  Nonspecific T wave biphasic changes in anterior leads.  Assessment:  Nonspecific palpitations without evidence of ischemia.  BP under good control.  Headaches continue unabated.  Patient living with many worries.  Plan:  Check other related labs 1. Essential hypertension, benign  Comprehensive metabolic panel, T4, free, TSH, Sedimentation rate, EKG 12-Lead  2. Palpitations  Comprehensive metabolic panel, T4, free, TSH, Sedimentation rate, EKG 12-Lead

## 2012-03-01 LAB — COMPREHENSIVE METABOLIC PANEL
ALT: 33 U/L (ref 0–35)
AST: 34 U/L (ref 0–37)
Albumin: 4.1 g/dL (ref 3.5–5.2)
Alkaline Phosphatase: 57 U/L (ref 39–117)
BUN: 26 mg/dL — ABNORMAL HIGH (ref 6–23)
CO2: 24 mEq/L (ref 19–32)
Calcium: 9.8 mg/dL (ref 8.4–10.5)
Chloride: 106 mEq/L (ref 96–112)
Creat: 1.28 mg/dL — ABNORMAL HIGH (ref 0.50–1.10)
Glucose, Bld: 101 mg/dL — ABNORMAL HIGH (ref 70–99)
Potassium: 4.3 mEq/L (ref 3.5–5.3)
Sodium: 136 mEq/L (ref 135–145)
Total Bilirubin: 0.3 mg/dL (ref 0.3–1.2)
Total Protein: 7 g/dL (ref 6.0–8.3)

## 2012-03-01 LAB — TSH: TSH: 0.125 u[IU]/mL — ABNORMAL LOW (ref 0.350–4.500)

## 2012-03-01 LAB — T4, FREE: Free T4: 1.64 ng/dL (ref 0.80–1.80)

## 2012-03-02 ENCOUNTER — Telehealth: Payer: Self-pay

## 2012-03-02 NOTE — Telephone Encounter (Signed)
Patient's daughter would like to know lab results for patient.  Best [phone]

## 2012-03-04 NOTE — Telephone Encounter (Signed)
Spoke with daughter advised labs normal.

## 2012-03-18 DIAGNOSIS — M25569 Pain in unspecified knee: Secondary | ICD-10-CM | POA: Diagnosis not present

## 2012-04-04 ENCOUNTER — Other Ambulatory Visit: Payer: Self-pay | Admitting: Family Medicine

## 2012-04-09 ENCOUNTER — Other Ambulatory Visit: Payer: Self-pay | Admitting: Physician Assistant

## 2012-04-09 ENCOUNTER — Telehealth: Payer: Self-pay

## 2012-04-09 MED ORDER — ZOLPIDEM TARTRATE 10 MG PO TABS: 10.0000 mg | ORAL_TABLET | Freq: Every evening | ORAL | Status: DC | PRN

## 2012-04-09 MED ORDER — LORAZEPAM 1 MG PO TABS: 1.0000 mg | ORAL_TABLET | Freq: Every evening | ORAL | Status: DC | PRN

## 2012-04-09 NOTE — Telephone Encounter (Signed)
Prescription ready to be faxed

## 2012-04-09 NOTE — Telephone Encounter (Signed)
Pt called to check on her RFs that pharmacy has been requesting. Please review requests under RF reqs for Ativan and Zolpidem.

## 2012-04-11 NOTE — Telephone Encounter (Signed)
It looks like Rx has been faxed. It is no longer at [facility].

## 2012-04-17 ENCOUNTER — Other Ambulatory Visit: Payer: Self-pay | Admitting: Dermatology

## 2012-04-17 ENCOUNTER — Ambulatory Visit (INDEPENDENT_AMBULATORY_CARE_PROVIDER_SITE_OTHER): Payer: Medicare Other | Admitting: Family Medicine

## 2012-04-17 ENCOUNTER — Telehealth: Payer: Self-pay

## 2012-04-17 ENCOUNTER — Encounter: Payer: Self-pay | Admitting: Family Medicine

## 2012-04-17 VITALS — BP 120/82 | HR 79 | Temp 98.4°F | Resp 16 | Ht 62.0 in | Wt 149.4 lb

## 2012-04-17 DIAGNOSIS — G4700 Insomnia, unspecified: Secondary | ICD-10-CM

## 2012-04-17 DIAGNOSIS — F3289 Other specified depressive episodes: Secondary | ICD-10-CM

## 2012-04-17 DIAGNOSIS — R51 Headache: Secondary | ICD-10-CM | POA: Diagnosis not present

## 2012-04-17 DIAGNOSIS — M353 Polymyalgia rheumatica: Secondary | ICD-10-CM

## 2012-04-17 DIAGNOSIS — F329 Major depressive disorder, single episode, unspecified: Secondary | ICD-10-CM

## 2012-04-17 DIAGNOSIS — F32A Depression, unspecified: Secondary | ICD-10-CM

## 2012-04-17 DIAGNOSIS — D485 Neoplasm of uncertain behavior of skin: Secondary | ICD-10-CM | POA: Diagnosis not present

## 2012-04-17 MED ORDER — LORAZEPAM 1 MG PO TABS: 1.0000 mg | ORAL_TABLET | Freq: Every evening | ORAL | Status: DC | PRN

## 2012-04-17 MED ORDER — FLUOXETINE HCL 10 MG PO CAPS: 10.0000 mg | ORAL_CAPSULE | Freq: Every day | ORAL | Status: DC

## 2012-04-17 NOTE — Telephone Encounter (Signed)
Called in Rx for Ativan 1 mg and Ambien 10 mg. Pharmacist, Gearldine Bienenstock, informed me that the pt had both of these filled on 04/09/12, with 30 tabs each, no RF, written by [facility]  Do you still want to RF these meds?  Also, Golytely comes in a 4,000 ml jug that is atypical for refills. Do you still want this with 6 refills?

## 2012-04-17 NOTE — Telephone Encounter (Signed)
Refill requested for Lorazepam 1 mg also

## 2012-04-17 NOTE — Telephone Encounter (Signed)
Pharmacy sent a fax requesting refill on Zolpidem 10 mg.

## 2012-04-17 NOTE — Progress Notes (Signed)
This 79 year old woman is here for followup on her headaches. The last 6 weeks her headaches and getting worse with symptoms reminiscent of her past polymyalgia rheumatica. Headache started on the right side and progressed to involving her whole head.  Patient's abdominal symptoms cleared.  The headache situation has been getting gradually getting worse and is associated with a switch from brand name Prozac to generic Prozac. In addition, patient had an appointment for acupuncture and she thinks that perhaps acupuncture treatment backfired.  Patient suffers from chronic anxiety, and insomnia. She does state that she is sleeping better though  Patient denies myalgias, chest pain, shortness of breath, dysphagia, diplopia, or weakness. The patient does not speak English so her daughter translated for her. This required extra time face-to-face-30 minutes  Objective: No acute distress, patient is good eye contact and seems cheerful.  There is no tenderness in the head and neck and the rest of her head and neck exam is normal  Assessment: Probable stress, anxiety. Nevertheless, given the PMR situation the past, prudent his recheck the sedimentation rate  Plan: Refill lorazepam for insomnia along with her Ambien. Also I refilled her GoLYTELY and wrote for brand name Prozac 10 mg daily.  Family is well-known to me and will be calling Me back if the situation does not improve.  Sedimentation rate pending  Signed, Sheila Oats.D.

## 2012-04-18 ENCOUNTER — Telehealth: Payer: Self-pay | Admitting: Family Medicine

## 2012-04-18 LAB — SEDIMENTATION RATE: Sed Rate: 34 mm/hr — ABNORMAL HIGH (ref 0–22)

## 2012-04-18 NOTE — Telephone Encounter (Signed)
Per Dr. Milus Glazier, Ativan 1 mg, 1 tab at bedtime as needed. Qty 30, 5 RF.  Ambien 10 mg 1 QHS prn, Qty 90, 1 RF.  Hold until April 4th since the last Rx was filled on March 4th.

## 2012-05-08 ENCOUNTER — Other Ambulatory Visit: Payer: Self-pay | Admitting: Family Medicine

## 2012-05-31 DIAGNOSIS — N816 Rectocele: Secondary | ICD-10-CM | POA: Diagnosis not present

## 2012-05-31 DIAGNOSIS — N8111 Cystocele, midline: Secondary | ICD-10-CM | POA: Diagnosis not present

## 2012-06-03 DIAGNOSIS — Z049 Encounter for examination and observation for unspecified reason: Secondary | ICD-10-CM | POA: Diagnosis not present

## 2012-06-03 DIAGNOSIS — G44229 Chronic tension-type headache, not intractable: Secondary | ICD-10-CM | POA: Diagnosis not present

## 2012-06-03 DIAGNOSIS — M542 Cervicalgia: Secondary | ICD-10-CM | POA: Diagnosis not present

## 2012-06-03 DIAGNOSIS — Z79899 Other long term (current) drug therapy: Secondary | ICD-10-CM | POA: Diagnosis not present

## 2012-06-03 DIAGNOSIS — M62838 Other muscle spasm: Secondary | ICD-10-CM | POA: Diagnosis not present

## 2012-06-03 DIAGNOSIS — IMO0001 Reserved for inherently not codable concepts without codable children: Secondary | ICD-10-CM | POA: Diagnosis not present

## 2012-08-13 DIAGNOSIS — M25569 Pain in unspecified knee: Secondary | ICD-10-CM | POA: Diagnosis not present

## 2012-08-17 ENCOUNTER — Ambulatory Visit (INDEPENDENT_AMBULATORY_CARE_PROVIDER_SITE_OTHER): Payer: Medicare Other | Admitting: Family Medicine

## 2012-08-17 VITALS — BP 137/76 | HR 67 | Temp 98.0°F | Resp 16 | Ht 62.0 in | Wt 147.0 lb

## 2012-08-17 DIAGNOSIS — M353 Polymyalgia rheumatica: Secondary | ICD-10-CM | POA: Diagnosis not present

## 2012-08-17 DIAGNOSIS — R5381 Other malaise: Secondary | ICD-10-CM | POA: Diagnosis not present

## 2012-08-17 DIAGNOSIS — R5383 Other fatigue: Secondary | ICD-10-CM | POA: Diagnosis not present

## 2012-08-17 DIAGNOSIS — R51 Headache: Secondary | ICD-10-CM

## 2012-08-17 LAB — COMPREHENSIVE METABOLIC PANEL
ALT: 16 U/L (ref 0–35)
AST: 18 U/L (ref 0–37)
Albumin: 3.9 g/dL (ref 3.5–5.2)
Alkaline Phosphatase: 68 U/L (ref 39–117)
BUN: 26 mg/dL — ABNORMAL HIGH (ref 6–23)
CO2: 25 mEq/L (ref 19–32)
Calcium: 9.5 mg/dL (ref 8.4–10.5)
Chloride: 105 mEq/L (ref 96–112)
Creat: 1.36 mg/dL — ABNORMAL HIGH (ref 0.50–1.10)
Glucose, Bld: 86 mg/dL (ref 70–99)
Potassium: 4.7 mEq/L (ref 3.5–5.3)
Sodium: 137 mEq/L (ref 135–145)
Total Bilirubin: 0.2 mg/dL — ABNORMAL LOW (ref 0.3–1.2)
Total Protein: 7.2 g/dL (ref 6.0–8.3)

## 2012-08-17 LAB — POCT CBC
Granulocyte percent: 61.8 %G (ref 37–80)
HCT, POC: 37.8 % (ref 37.7–47.9)
Hemoglobin: 11.9 g/dL — AB (ref 12.2–16.2)
Lymph, poc: 2.1 (ref 0.6–3.4)
MCH, POC: 28.3 pg (ref 27–31.2)
MCHC: 31.5 g/dL — AB (ref 31.8–35.4)
MCV: 89.8 fL (ref 80–97)
MID (cbc): 0.6 (ref 0–0.9)
MPV: 8.6 fL (ref 0–99.8)
POC Granulocyte: 4.3 (ref 2–6.9)
POC LYMPH PERCENT: 30 %L (ref 10–50)
POC MID %: 8.2 %M (ref 0–12)
Platelet Count, POC: 324 10*3/uL (ref 142–424)
RBC: 4.21 M/uL (ref 4.04–5.48)
RDW, POC: 13.4 %
WBC: 7 10*3/uL (ref 4.6–10.2)

## 2012-08-17 NOTE — Progress Notes (Signed)
This is an 79 year old woman from [place] who presents with chronic headaches and recent blood pressure elevation at home. In particular, patient is worried about her husband has had a dry cough for last couple weeks.  Patient has been taking blood pressure medicine and wonders if she should increase it. She's having no chest pain shortness of breath, or leg pain. She does have persistent headaches every day and has trouble sleeping chronically.  Objective: Blood pressure today 136/86, pulse regular at 60 per minute, patient, calm with good eye contact although she does appear to become anxious when her husband is being examined. HEENT: Unremarkable Neck: Supple no adenopathy thyromegaly or JVD Chest: Clear Heart: Regular no murmur Skin: Clear and dry Extremities: Nontender legs or calves, no edema  Patient was given oxygen to breathe for 10 minutes to see if this would reduce her headache. Patient says that the oxygen at 2 L per minute reduce her headache from 5/10 to a 1/10.  Assessment:  Headache responsive to oxygen  Plan:  Home oxygen for prn use Headache(784.0) - Plan: Sedimentation Rate, POCT CBC, Comprehensive metabolic panel  Other malaise and fatigue - Plan: Sedimentation Rate, POCT CBC, Comprehensive metabolic panel  Polymyalgia rheumatica - Plan: Sedimentation Rate, POCT CBC, Comprehensive metabolic panel  Signed, Elvina Sidle, MD

## 2012-08-18 LAB — SEDIMENTATION RATE: Sed Rate: 28 mm/hr — ABNORMAL HIGH (ref 0–22)

## 2012-08-20 ENCOUNTER — Telehealth: Payer: Self-pay | Admitting: Radiology

## 2012-08-20 NOTE — Telephone Encounter (Signed)
Patients daughter advised of normal labs.

## 2012-09-02 DIAGNOSIS — G44229 Chronic tension-type headache, not intractable: Secondary | ICD-10-CM | POA: Diagnosis not present

## 2012-09-11 ENCOUNTER — Other Ambulatory Visit: Payer: Self-pay | Admitting: Family Medicine

## 2012-09-17 ENCOUNTER — Other Ambulatory Visit: Payer: Self-pay | Admitting: Family Medicine

## 2012-09-17 DIAGNOSIS — K219 Gastro-esophageal reflux disease without esophagitis: Secondary | ICD-10-CM

## 2012-09-17 MED ORDER — OMEPRAZOLE 20 MG PO CPDR: 20.0000 mg | DELAYED_RELEASE_CAPSULE | Freq: Every day | ORAL | Status: DC

## 2012-09-26 DIAGNOSIS — N8111 Cystocele, midline: Secondary | ICD-10-CM | POA: Diagnosis not present

## 2012-10-29 ENCOUNTER — Other Ambulatory Visit: Payer: Self-pay | Admitting: Family Medicine

## 2012-11-01 ENCOUNTER — Other Ambulatory Visit: Payer: Self-pay | Admitting: Family Medicine

## 2012-11-01 NOTE — Telephone Encounter (Signed)
Patient called regarding refill of alprazolam. It was done 9/23. It has not been filled yet and she is wanting to know if it could be changed to 90 day supply. Please advise

## 2012-11-02 ENCOUNTER — Other Ambulatory Visit: Payer: Self-pay

## 2012-11-14 ENCOUNTER — Other Ambulatory Visit: Payer: Self-pay | Admitting: Family Medicine

## 2012-11-14 DIAGNOSIS — E039 Hypothyroidism, unspecified: Secondary | ICD-10-CM

## 2012-11-14 MED ORDER — LEVOTHYROXINE SODIUM 125 MCG PO TABS: 125.0000 ug | ORAL_TABLET | Freq: Every day | ORAL | Status: DC

## 2012-11-14 NOTE — Telephone Encounter (Signed)
Notified daughter that the only request I see from pharmacy is from today, but RF has been sent in. Advised of need for f/up for more.

## 2012-11-14 NOTE — Telephone Encounter (Signed)
Ok to refill 

## 2012-11-14 NOTE — Telephone Encounter (Signed)
Patients daughter Florene Route  is calling to check on status of refill for levothyroxine. She called pharmacy over 5 days ago asking for the refill for her mother, she wanted to call us and put in a requests also.   Pharmacy: [facility] [place]   Best:[phone]

## 2012-11-14 NOTE — Addendum Note (Signed)
Addended by: Elvina Sidle on: 11/14/2012 05:23 PM   Modules accepted: Orders

## 2012-11-18 DIAGNOSIS — Z23 Encounter for immunization: Secondary | ICD-10-CM | POA: Diagnosis not present

## 2012-11-19 ENCOUNTER — Other Ambulatory Visit: Payer: Self-pay | Admitting: Family Medicine

## 2012-11-20 ENCOUNTER — Other Ambulatory Visit: Payer: Self-pay

## 2012-11-22 ENCOUNTER — Other Ambulatory Visit: Payer: Self-pay

## 2012-11-22 DIAGNOSIS — Z1231 Encounter for screening mammogram for malignant neoplasm of breast: Secondary | ICD-10-CM

## 2012-11-28 ENCOUNTER — Emergency Department (INDEPENDENT_AMBULATORY_CARE_PROVIDER_SITE_OTHER)
Admission: EM | Admit: 2012-11-28 | Discharge: 2012-11-28 | Disposition: A | Discharge status: Home / Self Care | Payer: Medicare Other | Source: Home / Self Care | Attending: Family Medicine | Admitting: Family Medicine

## 2012-11-28 ENCOUNTER — Encounter (HOSPITAL_COMMUNITY): Payer: Self-pay | Admitting: Emergency Medicine

## 2012-11-28 DIAGNOSIS — K2970 Gastritis, unspecified, without bleeding: Secondary | ICD-10-CM | POA: Diagnosis not present

## 2012-11-28 MED ORDER — GI COCKTAIL ~~LOC~~
30.0000 mL | Freq: Once | ORAL | Status: AC
  Administered 2012-11-28: 30 mL via ORAL

## 2012-11-28 MED ORDER — GI COCKTAIL ~~LOC~~
ORAL | Status: AC
  Filled 2012-11-28: qty 30

## 2012-11-28 NOTE — ED Provider Notes (Signed)
Anita Banks is a 79 y.o. female who presents to Urgent Care today for epigastric abdominal pain starting yesterday evening. Patient notes upper left quadrant the left lower chest pain. It does not seem to be related to food or exertion. She notes some radiation to her left shoulder. She denies any shortness of breath or palpitations. She has had similar pain in the past which was thought to be due to reflux or gas. She denies any palpitations.   Past Medical History  Diagnosis Date  . Hypertension   . Anxiety   . Migraine    History  Substance Use Topics  . Smoking status: Never Smoker   . Smokeless tobacco: Not on file  . Alcohol Use: Not on file   ROS as above Medications reviewed. No current facility-administered medications for this encounter.   Current Outpatient Prescriptions  Medication Sig Dispense Refill  . butalbital-aspirin-caffeine (FIORINAL) 50-325-40 MG per capsule TAKE ONE CAPSULE BY MOUTH EVERY 8 HOURS AS NEEDED  30 capsule  2  . calcium-vitamin D (OSCAL WITH D) 500-200 MG-UNIT per tablet Take 1 tablet by mouth daily.  90 tablet  3  . Cholecalciferol (VITAMIN D3) 1000 UNITS CAPS Take by mouth daily.      . Coenzyme Q10 (CO Q-10) 100 MG CAPS Take by mouth daily.      . diclofenac sodium (VOLTAREN) 1 % GEL Apply 2 g topically 4 (four) times daily.  100 g  6  . Docusate Calcium (STOOL SOFTENER PO) Take by mouth daily.      Marland Kitchen estradiol (ESTRACE) 0.1 MG/GM vaginal cream Place 2 g vaginally 2 (two) times a week.      . fish oil-omega-3 fatty acids 1000 MG capsule Take 2 g by mouth daily.      Marland Kitchen FLUoxetine (PROZAC) 10 MG capsule Take 1 capsule (10 mg total) by mouth daily.  90 capsule  3  . glucosamine-chondroitin 500-400 MG tablet Take 1 tablet by mouth 3 (three) times daily.      Marland Kitchen ketorolac (TORADOL) 10 MG tablet One daily as needed for headache  30 tablet  5  . levothyroxine (SYNTHROID, LEVOTHROID) 125 MCG tablet Take 1 tablet (125 mcg total) by mouth daily  before breakfast. PATIENT NEEDS OFFICE VISIT FOR ADDITIONAL REFILLS  90 tablet  3  . lisinopril (PRINIVIL,ZESTRIL) 10 MG tablet 1 in am, 2 in afternoon  90 tablet  3  . lisinopril (PRINIVIL,ZESTRIL) 10 MG tablet 1 in am, 2 in afternoon  90 tablet  3  . lisinopril (PRINIVIL,ZESTRIL) 20 MG tablet TAKE 1 TABLET (20 MG TOTAL) BY MOUTH DAILY.  90 tablet  0  . LORazepam (ATIVAN) 1 MG tablet TAKE 1 TABLET BY MOUTH AT BEDTIME AS NEEDED AND MAY TAKE 1/4 TABLET IN AM IF NEEDED  30 tablet  5  . omeprazole (PRILOSEC) 20 MG capsule Take 1 capsule (20 mg total) by mouth daily.  30 capsule  11  . POLYETHYLENE GLYCOL 3350 PO Take by mouth daily.      . polyethylene glycol powder (GLYCOLAX/MIRALAX) powder MIX ONE SCOOP (CAPFUL) IN LIQUID AND TAKE ONCE DAILY  527 g  11  . sucralfate (CARAFATE) 1 GM/10ML suspension Take 10 mLs (1 g total) by mouth 4 (four) times daily -  with meals and at bedtime.  420 mL  3  . zolpidem (AMBIEN) 10 MG tablet TAKE 1 TABLET AT BEDTIME IF NEEDED  90 tablet  1    Exam:  BP 138/89  Pulse 84  Temp(Src) 97.6 F (36.4 C)  Resp 14  SpO2 96% Gen: Well NAD HEENT: EOMI,  MMM Lungs: CTABL Nl WOB Heart: RRR no MRG Abd: NABS, NT, ND Exts: Non edematous BL  LE, warm and well perfused.   Twelve-lead EKG shows normal sinus rhythm at 76 beats per minute. Completely normal  Patient was given a GI cocktail and had complete resolution of her symptoms  Assessment and Plan: 79 y.o. female with epigastric abdominal pain. This is likely esophagitis or gastritis as patient's EKG was completely normal and she had complete resolution of her symptoms with GI cocktail. Additionally she has had symptoms like this in the past. Plan increase omeprazole to twice daily and followup with primary care provider. If she worsens present directly to the emergency room. I discussed the plan with the patient and her daughter. They agree with this plan.  Discussed warning signs or symptoms. Please see discharge  instructions. Patient expresses understanding.      Rodolph Bong, MD 11/28/12 [phone]

## 2012-11-28 NOTE — ED Notes (Signed)
Via daughter... Pt c/o chest pain on left side that radiates to back Denies: SOB, nauseas, arm pain Had a normal echocardiogram about x3 months ago She is alert w/no signs of acute distress.

## 2012-12-04 ENCOUNTER — Ambulatory Visit (INDEPENDENT_AMBULATORY_CARE_PROVIDER_SITE_OTHER): Payer: Medicare Other | Admitting: Family Medicine

## 2012-12-04 ENCOUNTER — Encounter: Payer: Self-pay | Admitting: Family Medicine

## 2012-12-04 VITALS — BP 136/83 | HR 78 | Temp 98.9°F | Resp 17 | Ht 63.0 in | Wt 151.0 lb

## 2012-12-04 DIAGNOSIS — K219 Gastro-esophageal reflux disease without esophagitis: Secondary | ICD-10-CM | POA: Diagnosis not present

## 2012-12-04 DIAGNOSIS — E039 Hypothyroidism, unspecified: Secondary | ICD-10-CM | POA: Diagnosis not present

## 2012-12-04 DIAGNOSIS — I10 Essential (primary) hypertension: Secondary | ICD-10-CM | POA: Diagnosis not present

## 2012-12-04 DIAGNOSIS — R1013 Epigastric pain: Secondary | ICD-10-CM

## 2012-12-04 DIAGNOSIS — G4700 Insomnia, unspecified: Secondary | ICD-10-CM

## 2012-12-04 MED ORDER — ZOLPIDEM TARTRATE 10 MG PO TABS: 10.0000 mg | ORAL_TABLET | Freq: Every evening | ORAL | Status: DC | PRN

## 2012-12-04 MED ORDER — SUCRALFATE 1 GM/10ML PO SUSP: 1.0000 g | Freq: Three times a day (TID) | ORAL | Status: DC

## 2012-12-04 MED ORDER — LISINOPRIL 20 MG PO TABS: 20.0000 mg | ORAL_TABLET | Freq: Every day | ORAL | Status: DC

## 2012-12-04 MED ORDER — LEVOTHYROXINE SODIUM 125 MCG PO TABS: ORAL_TABLET | ORAL | Status: DC

## 2012-12-04 NOTE — Progress Notes (Signed)
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  She was seen in the other Urgent Care several days ago (last Thursday), given GI cocktail which helped.  She is accompanied by her daughter.  Patient has long hx of migraines and myalgia.  The abdominal pains are now very mild and migratory.  Associated with a great deal of gas.  On further questioning, patient reveals use of baking soda multiple times a day.  Patient has long hx GERD  Objective:  NAD  HEENT:  Normal Neck:  No thyromegaly or adenopathy, supple Chest:  Clear Heart:  Reg, no murmur Abdomen:  Normal to palpation, auscultation and inspection Ext:  1+ edema Skin: clear  Assessment:   GERD (gastroesophageal reflux disease) - Plan: sucralfate (CARAFATE) 1 GM/10ML suspension  Abdominal pain, epigastric  Hypertension - Plan: lisinopril (PRINIVIL,ZESTRIL) 20 MG tablet  Unspecified hypothyroidism - Plan: levothyroxine (SYNTHROID, LEVOTHROID) 125 MCG tablet  Hypothyroid - Plan: levothyroxine (SYNTHROID, LEVOTHROID) 125 MCG tablet  Insomnia - Plan: zolpidem (AMBIEN) 10 MG tablet  Signed, Elvina Sidle, MD

## 2012-12-10 DIAGNOSIS — G44229 Chronic tension-type headache, not intractable: Secondary | ICD-10-CM | POA: Diagnosis not present

## 2012-12-10 DIAGNOSIS — G43719 Chronic migraine without aura, intractable, without status migrainosus: Secondary | ICD-10-CM | POA: Diagnosis not present

## 2012-12-10 DIAGNOSIS — G43019 Migraine without aura, intractable, without status migrainosus: Secondary | ICD-10-CM | POA: Diagnosis not present

## 2013-01-09 ENCOUNTER — Telehealth: Payer: Self-pay | Admitting: *Deleted

## 2013-01-09 ENCOUNTER — Other Ambulatory Visit: Payer: Self-pay | Admitting: Family Medicine

## 2013-01-09 MED ORDER — CENTRUM SILVER ADULT 50+ PO TABS: 1.0000 | ORAL_TABLET | Freq: Every day | ORAL | Status: AC

## 2013-01-09 MED ORDER — VITAMIN D3 25 MCG (1000 UT) PO CAPS: 1.0000 | ORAL_CAPSULE | Freq: Every day | ORAL | Status: DC

## 2013-01-09 MED ORDER — GLUCOSAMINE-CHONDROITIN 500-400 MG PO TABS: 1.0000 | ORAL_TABLET | Freq: Two times a day (BID) | ORAL | Status: AC

## 2013-01-09 MED ORDER — FISH OIL 1200 MG PO CAPS: 1.0000 | ORAL_CAPSULE | Freq: Every day | ORAL | Status: AC

## 2013-01-09 MED ORDER — FISH OIL 1200 MG PO CAPS: 1.0000 | ORAL_CAPSULE | Freq: Every day | ORAL | Status: DC

## 2013-01-09 MED ORDER — CO Q-10 100 MG PO CAPS: 1.0000 | ORAL_CAPSULE | Freq: Every day | ORAL | Status: AC

## 2013-01-09 MED ORDER — CO Q-10 100 MG PO CAPS: 1.0000 | ORAL_CAPSULE | Freq: Every day | ORAL | Status: DC

## 2013-01-09 NOTE — Telephone Encounter (Signed)
Faxed prescription with Lisinopril order to [facility]), per Dr Milus Glazier. First confirmation not received, refaxed and confirmation page received.

## 2013-01-09 NOTE — Telephone Encounter (Signed)
Faxed prescriptions to [facility]'s pharmacy [facility]), per Dr Milus Glazier. Confirmation page received.

## 2013-01-20 ENCOUNTER — Ambulatory Visit
Admission: RE | Admit: 2013-01-20 | Discharge: 2013-01-20 | Disposition: A | Discharge status: Home / Self Care | Payer: Medicare Other | Source: Ambulatory Visit

## 2013-01-20 DIAGNOSIS — Z1231 Encounter for screening mammogram for malignant neoplasm of breast: Secondary | ICD-10-CM | POA: Diagnosis not present

## 2013-02-04 DIAGNOSIS — N8111 Cystocele, midline: Secondary | ICD-10-CM | POA: Diagnosis not present

## 2013-02-04 DIAGNOSIS — Z01419 Encounter for gynecological examination (general) (routine) without abnormal findings: Secondary | ICD-10-CM | POA: Diagnosis not present

## 2013-02-04 DIAGNOSIS — R87619 Unspecified abnormal cytological findings in specimens from cervix uteri: Secondary | ICD-10-CM | POA: Diagnosis not present

## 2013-02-04 DIAGNOSIS — Z124 Encounter for screening for malignant neoplasm of cervix: Secondary | ICD-10-CM | POA: Diagnosis not present

## 2013-02-10 ENCOUNTER — Ambulatory Visit (INDEPENDENT_AMBULATORY_CARE_PROVIDER_SITE_OTHER): Payer: Medicare Other | Admitting: Family Medicine

## 2013-02-10 ENCOUNTER — Telehealth: Payer: Self-pay

## 2013-02-10 VITALS — BP 152/84 | HR 68 | Temp 97.9°F | Resp 18 | Wt 152.0 lb

## 2013-02-10 DIAGNOSIS — R51 Headache: Secondary | ICD-10-CM

## 2013-02-10 DIAGNOSIS — M316 Other giant cell arteritis: Secondary | ICD-10-CM

## 2013-02-10 DIAGNOSIS — E039 Hypothyroidism, unspecified: Secondary | ICD-10-CM | POA: Diagnosis not present

## 2013-02-10 LAB — POCT CBC
GRANULOCYTE PERCENT: 57.9 % (ref 37–80)
HEMATOCRIT: 37.4 % — AB (ref 37.7–47.9)
HEMOGLOBIN: 11.7 g/dL — AB (ref 12.2–16.2)
Lymph, poc: 2 (ref 0.6–3.4)
MCH, POC: 28.3 pg (ref 27–31.2)
MCHC: 31.3 g/dL — AB (ref 31.8–35.4)
MCV: 90.4 fL (ref 80–97)
MID (cbc): 0.5 (ref 0–0.9)
MPV: 8.8 fL (ref 0–99.8)
POC GRANULOCYTE: 3.5 (ref 2–6.9)
POC LYMPH %: 33.7 % (ref 10–50)
POC MID %: 8.4 % (ref 0–12)
Platelet Count, POC: 300 10*3/uL (ref 142–424)
RBC: 4.14 M/uL (ref 4.04–5.48)
RDW, POC: 13.8 %
WBC: 6 10*3/uL (ref 4.6–10.2)

## 2013-02-10 LAB — POCT SEDIMENTATION RATE: POCT SED RATE: 57 mm/hr — AB (ref 0–22)

## 2013-02-10 MED ORDER — PREDNISONE 20 MG PO TABS: 60.0000 mg | ORAL_TABLET | Freq: Every day | ORAL | Status: DC

## 2013-02-10 NOTE — Progress Notes (Addendum)
Subjective:    Patient ID: Anita Banks, female    DOB: 20-Aug-1927, 80 y.o.   MRN: [id number]  HPI This chart was scribed for Anita Banks, by Anita Banks, Scribe. This patient was seen in room 1 and the patient's care was started at 5:32 PM.  HPI Comments: Anita Banks is a 80 y.o. 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 Last TSH January 2014 and was suppressed 0.125 and her medication was not changed. She has been seeing Dr. Joseph Art regularly.  She has also had mildly elevated sed rates and has polymyalgia rheumatica with sed rates ranging from 12 to 40 mostly in the 30's. No other rheumatologic labs drawn in the past 2 years.   She has hx of temporal arteritis and reports 2 flares over past 3 years in addition to her personal and family h/o chronic HAs. She reports that in the past the difference beween her reg HAs and her temporal arteritis has been the presence of unilateral eye pain associated w/ HA. She reports this episode of her HA worsened yesterday w/ right sided eye pain. She denies vision loss, photophobia or double vision. She reports that she would like to have her sedimentation rate checked today as well. She does not have a rheumatologist but has been followed by Dr. Joseph Art for this problem. She is here today with her daughter and her son-in-law who help provide the hx.  She has never required chronic prednisone.  She normally takes sched gabapentin w/ prn fioricet for her nml HAs and has been seen at [facility]. She has not taken any OTC medicines for this problem   Just in case: [phone] (Son in Sports coach)   Patient Active Problem List   Diagnosis Date Noted  . Polymyalgia rheumatica 04/20/2011  . INSOMNIA, PERSISTENT 06/26/2007  . HYPERCHOLESTEROLEMIA 02/20/2007  . Depression with anxiety 02/20/2007  . HYPOTHYROIDISM  12/03/2006  . OSTEOARTHRITIS 12/03/2006  . OSTEOPENIA 12/03/2006  . MIGRAINE VARIANTS, W/O INTRACTABLE MIGRAINE 11/26/2006  . IRRITABLE BOWEL SYNDROME 11/26/2006    Past Surgical History  Procedure Laterality Date  . Abdominal hysterectomy    . Hemhorroid  2003    History reviewed. No pertinent family history.  History   Social History  . Marital Status: Married    Spouse Name: N/A    Number of Children: N/A  . Years of Education: N/A   Occupational History  . Not on file.   Social History Main Topics  . Smoking status: Never Smoker   . Smokeless tobacco: Not on file  . Alcohol Use: Not on file  . Drug Use: Not on file  . Sexual Activity: Not on file   Other Topics Concern  . Not on file   Social History Narrative  . No narrative on file    Allergies  Allergen Reactions  . Amitriptyline Hcl   . Erythromycin   . Metronidazole   . Penicillins   . Rizatriptan Benzoate   . Rosuvastatin   . Tetracyclines & Related     Review of Systems  Constitutional: Negative for fever and chills.  Eyes: Positive for pain (right eye pain). Negative for photophobia and visual disturbance.  Respiratory: Negative for shortness of breath.   Neurological: Positive for headaches. Negative for numbness.      Objective:   Physical Exam  Nursing note and vitals reviewed. Constitutional: She is oriented to person, place, and time. She appears well-developed and well-nourished. No distress.  HENT:  Head: Normocephalic and atraumatic.  Eyes: Conjunctivae, EOM and lids are normal. Pupils are equal, round, and reactive to light. Right eye exhibits no discharge. Left eye exhibits no discharge.  Fundoscopic exam:      The right eye shows no hemorrhage and no papilledema.       The left eye shows no hemorrhage and no papilledema.  Normal funduscopic exam No orbital pain  Neck: Normal range of motion. No thyromegaly present.  Cardiovascular: Normal rate and regular rhythm.   Murmur  heard.  Systolic murmur is present with a grade of 1/6  No pain over temporal area or temporal artery on palpation. 2+ pulsations over temporal artery bilaterally.  Pulmonary/Chest: Effort normal and breath sounds normal. No respiratory distress. She has no wheezes. She has no rales.  Musculoskeletal: Normal range of motion. She exhibits no edema.  Lymphadenopathy:    She has no cervical adenopathy.  Neurological: She is alert and oriented to person, place, and time. She has normal strength. No cranial nerve deficit or sensory deficit. Coordination and gait normal.  Skin: Skin is warm and dry.  Psychiatric: She has a normal mood and affect. Thought content normal.   Triage Vitals: BP 152/84  Pulse 68  Temp(Src) 97.9 F (36.6 C) (Oral)  Resp 18  Wt 152 lb (68.947 kg)  SpO2 99%     Assessment & Plan:  Unspecified hypothyroidism - Plan: TSH, levothyroxine (SYNTHROID, LEVOTHROID) 112 MCG tablet  Giant cell arteritis - Plan: POCT CBC, POCT SEDIMENTATION RATE, TSH, C-reactive protein - per hx and family's appropriate concern that this has recurred since has had twice, so will check esr per their request - discussed that if elev - >60 - will proceed w/ trx w/ high dose steroids for temporal arteries but if lower than more likely to be reg HA - could be sinus cong, could be shingles - so if no improvement, worsening, rashes, recheck.  Family declines to wait for ESR so we will contact them by phone in an hr or 2 when results come back.   Headache(784.0) - Plan: POCT CBC, POCT SEDIMENTATION RATE, TSH, C-reactive protein  Hypothyroid - Plan: levothyroxine (SYNTHROID, LEVOTHROID) 112 MCG tablet - last TSH somewhat suppressed - dose of 125 was unchanged at that time so will recheck and if tsh still low than will decrease 125 to 112.  Meds ordered this encounter  Medications  . predniSONE (DELTASONE) 20 MG tablet    Sig: Take 3 tablets (60 mg total) by mouth daily with breakfast.    Dispense:   42 tablet    Refill:  0  . levothyroxine (SYNTHROID, LEVOTHROID) 112 MCG tablet    Sig: One daily    Dispense:  90 tablet    Refill:  0    I personally performed the services described in this documentation, which was scribed in my presence. The recorded information has been reviewed and considered, and addended by me as needed.  Delman Cheadle, MD MPH

## 2013-02-10 NOTE — Telephone Encounter (Signed)
Pt states that she was promised that lab results would be in and she would be called in within an hour. Pt would like to know lab results asap. Best# [phone]

## 2013-02-11 DIAGNOSIS — R51 Headache: Secondary | ICD-10-CM | POA: Diagnosis not present

## 2013-02-11 LAB — C-REACTIVE PROTEIN

## 2013-02-11 LAB — TSH: TSH: 0.059 u[IU]/mL — AB (ref 0.350–4.500)

## 2013-02-11 NOTE — Telephone Encounter (Signed)
Pt was contacted last night as promised. We spoke w/ her daughter and let her know that prednisone had been called into her pharmacy due to her mildly elev sed rate. She needs f/u w/ in within 1 wk to ensure she is tolerating it and needs to make an appt w/ a rheumatologist NOW as can take awhile but the course of prednisone for temporal arteritis is quite prolonged so that's why she needs to get the rheum appt sched ASAP.  If she would like a referral for this let us know but I think they were seeing someone w/ [facility].

## 2013-02-11 NOTE — Telephone Encounter (Signed)
I think she may be referring to sed rate results, this is elevated at 57 please advise

## 2013-02-13 DIAGNOSIS — R87619 Unspecified abnormal cytological findings in specimens from cervix uteri: Secondary | ICD-10-CM | POA: Diagnosis not present

## 2013-02-13 DIAGNOSIS — N72 Inflammatory disease of cervix uteri: Secondary | ICD-10-CM | POA: Diagnosis not present

## 2013-02-13 DIAGNOSIS — Z1151 Encounter for screening for human papillomavirus (HPV): Secondary | ICD-10-CM | POA: Diagnosis not present

## 2013-02-13 MED ORDER — LEVOTHYROXINE SODIUM 112 MCG PO TABS: ORAL_TABLET | ORAL | Status: DC

## 2013-02-19 ENCOUNTER — Ambulatory Visit: Payer: Medicare Other | Admitting: Family Medicine

## 2013-02-23 ENCOUNTER — Other Ambulatory Visit: Payer: Self-pay | Admitting: Physician Assistant

## 2013-02-24 NOTE — Telephone Encounter (Signed)
Dr Brigitte Pulse, you just saw pt for HAs. Can we RF?

## 2013-02-25 ENCOUNTER — Telehealth: Payer: Self-pay

## 2013-02-25 ENCOUNTER — Other Ambulatory Visit: Payer: Self-pay

## 2013-02-25 NOTE — Telephone Encounter (Signed)
pts daughter calling to request status on prior auth for pts lisinopril  Tawni Carnes   [phone]  bf

## 2013-02-25 NOTE — Telephone Encounter (Signed)
Pt was instructed to f/u within 1 wk of visit on 1/5 due to putting her on prolonged high dose prednisone for poss temporal arteritis. Need to do recheck, check bp, weight, labs w/ sugar and come up with prednisone taper plan, discuss poss rheum eval, etc. Can discuss fioricet refill w/ whoever she sees at that visit - try to see Dr. Joseph Art or myself if poss due to extensive hx of HA.

## 2013-02-27 NOTE — Telephone Encounter (Signed)
Called pharm to check to see if PA needed for lisinopril? Pharm stated none of pt's Rxs are showing a need for PA and lisinopril was filled on 02/19/13 w/no problem. LMOM on daughter's VM giving this info and asked for CB if she had other ?s or if we misunderstood what she needed.

## 2013-02-27 NOTE — Telephone Encounter (Signed)
Daughter called back and stated pt brought in letter from [facility] at last OV that states that they will cover 1 RF of lisinopril in new year but after that they will not cover more than 2 per day. She reported that pt takes 1 tab of 10 mg in AM and 2 tabs of 10 mg in PM. Ins told her we had not faxed form back. We have scanned form in EPIC that shows was faxed on 01/09/13, but it was not an actual PA form. Daughter is to call pt and get [facility] ID # and cust service # on back of card (we do not have a copy). Daughter called back w/info and I called [facility] and completed PA. Went to review, awaiting decision. I noticed that our records show pt taking 1 tab of 20 mg since 12/04/12, but pharm verifies that the 10 mg (30 mg total daily) is what has been filled most recently (orig Rx from 02/29/12), and daughter states that pt has taken total of 30 mg for 3 years.

## 2013-03-03 NOTE — Telephone Encounter (Signed)
PA for TID dosing of lisinopril 10 mg approved through 02/05/14.

## 2013-03-03 NOTE — Telephone Encounter (Signed)
Notified daughter

## 2013-03-31 DIAGNOSIS — N72 Inflammatory disease of cervix uteri: Secondary | ICD-10-CM | POA: Diagnosis not present

## 2013-03-31 DIAGNOSIS — N7689 Other specified inflammation of vagina and vulva: Secondary | ICD-10-CM | POA: Diagnosis not present

## 2013-04-23 DIAGNOSIS — G44229 Chronic tension-type headache, not intractable: Secondary | ICD-10-CM | POA: Diagnosis not present

## 2013-04-28 ENCOUNTER — Other Ambulatory Visit: Payer: Self-pay | Admitting: Family Medicine

## 2013-04-28 MED ORDER — LORAZEPAM 1 MG PO TABS: 1.0000 mg | ORAL_TABLET | Freq: Every evening | ORAL | Status: DC | PRN

## 2013-04-28 NOTE — Addendum Note (Signed)
Addended by: Robyn Haber on: 04/28/2013 05:02 PM   Modules accepted: Orders

## 2013-04-28 NOTE — Telephone Encounter (Signed)
Pt has an appt 4/2- she has only 1 pill left please advise refill on the Ativan

## 2013-04-29 NOTE — Telephone Encounter (Signed)
Notified pt. 

## 2013-04-29 NOTE — Telephone Encounter (Signed)
Faxed

## 2013-05-08 ENCOUNTER — Encounter: Payer: Self-pay | Admitting: Family Medicine

## 2013-05-08 ENCOUNTER — Ambulatory Visit (INDEPENDENT_AMBULATORY_CARE_PROVIDER_SITE_OTHER): Payer: Medicare Other | Admitting: Family Medicine

## 2013-05-08 VITALS — BP 140/75 | HR 74 | Temp 97.8°F | Resp 16 | Ht 63.0 in | Wt 153.0 lb

## 2013-05-08 DIAGNOSIS — G4700 Insomnia, unspecified: Secondary | ICD-10-CM

## 2013-05-08 DIAGNOSIS — E039 Hypothyroidism, unspecified: Secondary | ICD-10-CM

## 2013-05-08 DIAGNOSIS — IMO0002 Reserved for concepts with insufficient information to code with codable children: Secondary | ICD-10-CM

## 2013-05-08 DIAGNOSIS — M549 Dorsalgia, unspecified: Secondary | ICD-10-CM

## 2013-05-08 DIAGNOSIS — M1710 Unilateral primary osteoarthritis, unspecified knee: Secondary | ICD-10-CM | POA: Diagnosis not present

## 2013-05-08 DIAGNOSIS — M170 Bilateral primary osteoarthritis of knee: Secondary | ICD-10-CM

## 2013-05-08 DIAGNOSIS — M542 Cervicalgia: Secondary | ICD-10-CM

## 2013-05-08 MED ORDER — ZOLPIDEM TARTRATE 10 MG PO TABS: 10.0000 mg | ORAL_TABLET | Freq: Every evening | ORAL | Status: DC | PRN

## 2013-05-08 MED ORDER — METHYLPREDNISOLONE ACETATE 80 MG/ML IJ SUSP
80.0000 mg | Freq: Once | INTRAMUSCULAR | Status: AC
  Administered 2013-05-08: 80 mg via INTRA_ARTICULAR

## 2013-05-08 MED ORDER — LEVOTHYROXINE SODIUM 112 MCG PO TABS: ORAL_TABLET | ORAL | Status: DC

## 2013-05-08 MED ORDER — HYDROCODONE-ACETAMINOPHEN 5-325 MG PO TABS: 0.5000 | ORAL_TABLET | Freq: Four times a day (QID) | ORAL | Status: DC | PRN

## 2013-05-08 MED ORDER — MIRTAZAPINE 15 MG PO TABS: 15.0000 mg | ORAL_TABLET | Freq: Every day | ORAL | Status: DC

## 2013-05-08 NOTE — Progress Notes (Signed)
Subjective:  This chart was scribed for Robyn Haber, MD by Donato Schultz, Medical Scribe. This patient was seen in Room 25and the patient's care was started at 11:21 AM.   Patient ID: Anita Banks, female    DOB: 14-Dec-1927, 80 y.o.   MRN: [id number]  HPI HPI Comments: Anita Banks is a 80 y.o. female with a history of depression and chronic migraines who [facility] complaining of constant headaches.  The patient's daughter states that if the patient does not have headaches in the morning, she will experience them 2 hours after taking Prozac.  She states that she takes her Prozac everyday at 2 PM.  The patient's daughter states that the patient went to the Neurologist for her symptoms and was told that her headaches might be a side effect from the Prozac.  She states that she takes Butalbital for her headaches daily but states that it takes about 5-6 hours for the medication to work.  She states that she takes Lorazepam and Ambien to sleep.  She states that she takes 200 mg Gabapentin which helps her sleep but does not help her headaches.  She states that she is able to sleep through the night when she takes all 3 medications.  She states that she drinks caffeine and takes medication with caffeine throughout the day.  She states that she has tried to ween herself off of caffeine but states that her headaches increase in severity.  The patient states that she would like to try physical therapy to treat her headaches.  The patient is also complaining of constant joint pain located in her hands bilaterally.  The patient states that the joint pain is worse in her left hand.  She states that in the morning her symptoms are worse and she is unable to make a fist.  She states that she feels as though the pain is traveling through her arms bilaterally.  She states that she walks daily with her husband and works a lot with her hands.  The patient states that  she will experience joint pain in her knees bilaterally but gets a cortisone shot regularly to treat her symptoms.  The patient's daughter states that she had blood work done at [facility] which revealed that she was no longer suffering from polymyalgia.  She denies having a test done for rheumatoid arthritis done.    She states that she will experience leg swelling in the evening.  The patient is also complaining of feeling more fatigued than normal.    The patient states that she will experience palpitations before doing an activity.  She states that she is still taking baking soda and is not experiencing any abdominal problems.     Past Medical History  Diagnosis Date   Hypertension    Anxiety    Migraine    Arthritis    Past Surgical History  Procedure Laterality Date   Abdominal hysterectomy     Hemhorroid  2003   No family history on file. History   Social History   Marital Status: Married    Spouse Name: N/A    Number of Children: N/A   Years of Education: N/A   Occupational History   Not on file.   Social History Main Topics   Smoking status: Never Smoker    Smokeless tobacco: Not on file   Alcohol Use: Not on file   Drug Use: Not on file   Sexual Activity:  Not on file   Other Topics Concern   Not on file   Social History Narrative   No narrative on file   Allergies  Allergen Reactions   Amitriptyline Hcl    Erythromycin    Metronidazole    Penicillins    Rizatriptan Benzoate    Rosuvastatin    Tetracyclines & Related     Review of Systems  Constitutional: Positive for fatigue.  Cardiovascular: Positive for palpitations and leg swelling (in the evening).  Musculoskeletal: Positive for arthralgias (hands).  Neurological: Positive for headaches.  All other systems reviewed and are negative.     Objective:  Physical Exam  Nursing note and vitals reviewed. Constitutional: She is oriented to person, place, and time. She  appears well-developed and well-nourished. No distress.  HENT:  Head: Normocephalic and atraumatic.  Right Ear: External ear normal.  Left Ear: External ear normal.  Eyes: Conjunctivae and EOM are normal. Pupils are equal, round, and reactive to light.  Neck: Normal range of motion. Neck supple. No JVD present. No tracheal deviation present. No thyromegaly present.  Cardiovascular: Normal rate and regular rhythm.   Patient may have a very faint intermittent S4  Pulmonary/Chest: Effort normal and breath sounds normal.  Musculoskeletal: Normal range of motion. She exhibits no edema.  Some synovial swelling of MCP and PIP joints with full ROM  Neurological: She is alert and oriented to person, place, and time. No cranial nerve deficit. Coordination normal.  Skin: Skin is warm and dry. No rash noted. She is not diaphoretic. No erythema.  Psychiatric: She has a normal mood and affect. Her behavior is normal.     BP 140/75   Pulse 74   Temp(Src) 97.8 F (36.6 C)   Resp 16   Ht 5\' 3"  (1.6 m)   Wt 153 lb (69.4 kg)   BMI 27.11 kg/m2   SpO2 95% Assessment & Plan:   Overall, patient is about the same. She lives a very reclusive life.  Insomnia - Plan: zolpidem (AMBIEN) 10 MG tablet, mirtazapine (REMERON) 15 MG tablet  Arthritis of both knees - Plan: methylPREDNISolone acetate (DEPO-MEDROL) injection 80 mg, ANA, Rheumatoid factor, HYDROcodone-acetaminophen (NORCO) 5-325 MG per tablet  Unspecified hypothyroidism  Back pain  Signed, Robyn Haber, MD   I personally performed the services described in this documentation, which was scribed in my presence. The recorded information has been reviewed and is accurate.

## 2013-05-08 NOTE — Patient Instructions (Signed)
?????????? (Insomnia) ?????????? - ?????????, ??? ??????? ???????? ????? ?????? ?????? ?/??? ?????? ??? ????. ?????????? ????? ???? ??????- ? ?????????????? ????????. ??? ???????? ????????? ?????? ??????????????. ??????????????? ?????????? - ??? ????????? ???, ????????? ????? ???????? ??? ?????????????. ?????????? ??????????, ??? ???????, ???????? ??????????? ??????????? ??????? ? ???? ????????????? ?/??? ??????????? ???????????. ?? ???????? ?????????? ??? ?????? ?????????? ?????????. ???? ????? ?????????????? ??????? ???????? ????? ??????? ?????????? ???????, ??? ??? ??? ???????? ????????, ? ???? ??????????? ??????????? ?? ???????? ??????????.  ???????  ??????, ????????????, ?????????.  ?????????? ???????????.  ??????????? ??????? ? ???????, ? ???????, ?????????.  ???????? ?????????? ??????????????? ????????? ?? ????, ??? ???????? ????? ?? ????..  ????????????? ????????? ??????????????? ????? ???, ??? ?????? ?? ???.  ?????? ??????????? ?????????? ??????????????? ????? ????.  ???????????? ???????? ? ?????????????? ???????. ??? ????? ????????? ???????? ?? ????, ??? ??? ????? ???????? ?????????? ??????? ??? ???.  ???????????? ????????????, ????????, ???????, ?? ????????? ????? ?? ???.  ??????? ??????? ? ?????? ????? ????? ???? ???????? ??????????.  ?????????? ?????????? ?????? ???????.  ????????? ??????? ?????? ????? ????? ????????? ????????? ??? (??????? ?? ???????). ?????? ????? ?????? ?????????? ?? ?????? ?????? ??? ?????? ?????????. ?????????? ??????????? ?? ???????, ????? ? ??? ???????? ?????????????? ??????????? ??????? ?? ????? ??????? ??? (??????? ??????? ?????). ????? ??????? ?????????? ????????????????? ????? ????????. ???? ?? ?? ?????????? ????/????, ???? ??????????? ?? ???? ??????? ?? ????? ? ???????????? ?????????????? ??????????, ?? ?????? ?????????? ? ??????????? ??????? ?? ??????? ????????? ???, ??? ???????? ???????????????? ?????????? ??? ?????? ???. ??????? ????? - ???  ?????????, ??????? ??????? ???????????? ????? ? ???????????? ???????. ???????? ????? ??? ??????? ????? ??????????, ? ????? ??? ???? ??????????? ????? ??????? ?????????? ??? ?????? ???.  ????????   ??????? ????????? ?? ?????.  ?????????????? ? ??????? ???????????? ?? ????? ?????? ?? ???.  ????????? ? ???, ????? ??????? ? ?????????? ????? ??? ????. ???????  ???? ????? ????????? ??? ??????? ??? ??????? ?? ?????????, ??????? ????? ???????? ??????????. ???? ????? ?????? ???, ???? ? ????? ??????????? ?? ???????????? ???????? ??? ?????????????? ?????????. ?????????? ?????? ???????? ???????? ???????? ? ??????? ?????????? ?? ??????????.  ????? ????? ???? ???????? ??????? ???? ???????????????? ???????. ??? ???????, ????? ???????? ?? ????????????? ??? ??????????? ?????????????.  ?????????????? ????????? ?? ?????????? ?? ????????????? ??? ????????????? ??????????. ??? ????? ???????? ??????????.  ????? ?????????? ?????????? ?? ?????????? ????? ?????????????? ????????? ????????? ????????, ? ?????????:  ????????????? ??????? ????????????, ??????? ??????????????? ??????? ? ??????? ?????????? ? ??????.  ??????? ??????? ??????????? ???????????? ?? ????? ????? ????? ? ??????? ???.  ????????? ????? ? ??????? ??????? ?????????? ????? ???? ?????? ???? ?? ????? ?????? ????? ? ??????? ???. ?????? ????? ?? ?????? ?????????.  ?????????? ??????????? ??????? ?????? ?? ???.  ????????? ?????????????? ????? ???????????? ??? ????, ????? ?????????? ?? ???????? ? ????????? ?????????????.  ????????????? ?????? ? ???????? ???? ????? ????????? ????????, ???? ??? ?????? ????? ??????? ????, ??????? ?? ?? ?????? ?????????.  ??????????? ????????????? ??????, ??????? ??????? ??????? ???????????? ????????, ??? ??????? ?? ??? ?? ?????? ?? ???. ?????????? ?? ??????? ? ???????? ????????  ?????? ???????. ????????? ????? ? ??????? ???????, ? ??? ????? ? ? ????????? ???????? ???????? ??????????? ??????????. ????????? ?????????  ?????. ????????? ??? ????:  ?????? ????? ?????? ???.  ?????? ????? ?????? ????????????? ? ??????? ????.  ?????? ????????????????? ???.  ???? ???????????? ?? ?????. ?????? ?????????? ??????? ????? ?????????????? ???? ???????.  ????????? ? ???????, ???? ? ??????? 15 ????? ????? ???????????, ?? ??? ??? ?? ?????? ???????. ????????? ??? ????????? ??? ???-?????? ??????????????. ?? ????????? ?????? ?????. ?????????, ????? ????? ???????? ??????? ??????????, ? ?????? ????? ????????????? ? ???????.  ?????????? ?????????? ????? ?????? ?? ??? ? ???????????. ?? ?????????? ???? ??????? ? ??????? ???.  ????????? ??????????? ??????????? ????????????.  ????????? ????? ????????????? ?? ????? ?????? ?? ???. ? ??????? ?????????, ????????, ???????? ??????????? ??? ????? ???????, ????????? ??????? ???????????? ????????, ????? ??? ???????? ??????? ??????? ?????? ? ??????? ????? ?????.  ???????? ?????? ?????? ?? ???: ?????? ??????????????? ? ???? ? ?? ?? ????? ?????????? ???, ??????? ????, ????????????? ? ???????, ???????? ????? ???? ???????? ??????. ????????? ????? ????????? ?????? ???????? ????? ???????? ???????.  ?????????? ??????? ????????????, ??????? ?????????? ?? ?????????????? ???????????? ??????? ? ?????????? ?????????? ? ??????. ????? ??????? ????? ???????????? ???????????? ????? ???? ?????? ???????? ?????. ?? ????? ?????? ????????? ?? ??????? ?????? ? ???????????, ?????? ??????? ???? ???-?? ??????? ????????? ?????????????, ???????? ??????????? ????????? ????. ?? ?????? ???? ????????????? ???????, ???????????, ????????, ??? ??????????? ???? ?????????? ?????? ?? ?????? ????? ?? ?????? ????? ?????? ????.  ? ??????? ??? ???????????? ????????? ?????????? ?????????? ????????. ???? ??? ??????????, ??????????? ???? ?? ??????, ????????? ????, ??? ?????????? ??????????? ??????????? ??????? ?? ??? ????????. ?? ??????:  ?? ????????? ?????? ?????????? ?? ????? ????? ???????  ?????? ?????????????? ????????? ?????  ??????? ?? ????? ??????????  ?????????? ?????????? ?? ??????????? ??????? ???????? ????????????  Document Released: 01/23/2005 Document Revised: 04/17/2011 [facility] Patient Information 2014 [place], [place].

## 2013-05-09 ENCOUNTER — Telehealth: Payer: Self-pay

## 2013-05-09 LAB — RHEUMATOID FACTOR: Rhuematoid fact SerPl-aCnc: <10 IU/mL (ref <=14)

## 2013-05-09 LAB — TSH: TSH: 0.044 u[IU]/mL — ABNORMAL LOW (ref 0.350–4.500)

## 2013-05-09 NOTE — Telephone Encounter (Signed)
See labs 

## 2013-05-09 NOTE — Telephone Encounter (Signed)
Patient's daughter is calling to see if her mother's lab results are ready.  [phone]

## 2013-05-12 LAB — ANTI-NUCLEAR AB-TITER (ANA TITER): ANA Titer 1: 1:40 {titer} — ABNORMAL HIGH

## 2013-05-12 LAB — ANA: Anti Nuclear Antibody(ANA): POSITIVE — AB

## 2013-05-13 ENCOUNTER — Other Ambulatory Visit: Payer: Self-pay | Admitting: Family Medicine

## 2013-05-13 DIAGNOSIS — F4323 Adjustment disorder with mixed anxiety and depressed mood: Secondary | ICD-10-CM

## 2013-05-13 DIAGNOSIS — M1990 Unspecified osteoarthritis, unspecified site: Secondary | ICD-10-CM

## 2013-05-13 MED ORDER — CITALOPRAM HYDROBROMIDE 20 MG PO TABS: 20.0000 mg | ORAL_TABLET | Freq: Every day | ORAL | Status: DC

## 2013-05-15 ENCOUNTER — Ambulatory Visit (INDEPENDENT_AMBULATORY_CARE_PROVIDER_SITE_OTHER): Payer: Medicare Other | Admitting: Family Medicine

## 2013-05-15 VITALS — BP 140/94 | HR 65 | Temp 97.6°F | Resp 18 | Ht 63.0 in | Wt 150.6 lb

## 2013-05-15 DIAGNOSIS — F449 Dissociative and conversion disorder, unspecified: Secondary | ICD-10-CM

## 2013-05-15 DIAGNOSIS — M1990 Unspecified osteoarthritis, unspecified site: Secondary | ICD-10-CM

## 2013-05-15 DIAGNOSIS — M129 Arthropathy, unspecified: Secondary | ICD-10-CM

## 2013-05-15 DIAGNOSIS — F32A Depression, unspecified: Secondary | ICD-10-CM

## 2013-05-15 DIAGNOSIS — F329 Major depressive disorder, single episode, unspecified: Secondary | ICD-10-CM

## 2013-05-15 DIAGNOSIS — G4700 Insomnia, unspecified: Secondary | ICD-10-CM

## 2013-05-15 DIAGNOSIS — I10 Essential (primary) hypertension: Secondary | ICD-10-CM | POA: Diagnosis not present

## 2013-05-15 LAB — POCT SEDIMENTATION RATE: POCT SED RATE: 54 mm/hr — AB (ref 0–22)

## 2013-05-15 MED ORDER — DICLOFENAC SODIUM 75 MG PO TBEC: 75.0000 mg | DELAYED_RELEASE_TABLET | Freq: Every day | ORAL | Status: DC

## 2013-05-15 MED ORDER — CHLORTHALIDONE 100 MG PO TABS: 100.0000 mg | ORAL_TABLET | Freq: Every day | ORAL | Status: DC

## 2013-05-15 MED ORDER — CHLORTHALIDONE 25 MG PO TABS: 25.0000 mg | ORAL_TABLET | Freq: Every day | ORAL | Status: DC

## 2013-05-15 NOTE — Progress Notes (Signed)
This chart was scribed for Robyn Haber, MD by Vernell Barrier, Medical Scribe. This patient's care was started at 6:03 PM.   Patient ID: Anita Banks MRN: [id number], DOB: 04-28-1927, 80 y.o. Date of Encounter: 05/15/2013, 6:01 PM  Primary Physician: Walker Kehr, MD  Chief Complaint: Headache  HPI: 80 y.o. year old female with history below presents saying she "feels horrible" over all. Daughter states she took her mother's blood pressure because she felt this way. Reports it has been high all day. Pt has stopped taking migraine medication. Started vicodin and states that did not help with headache. Irritated and nausea are side effects. Reports 5 days of painful headaches; states she was crying due to pain. Also states Celexa presents with really bad side effects. Reports today was the worst; pt had no energy and could barely move. Has taken Prozac in the past that has provided some relief. Pt is not taking any of her precripitos medications at the moment.   Past Medical History  Diagnosis Date   Hypertension    Anxiety    Migraine    Arthritis      Home Meds: Prior to Admission medications   Medication Sig Start Date End Date Taking? Authorizing Provider  calcium-vitamin D (OSCAL WITH D) 500-200 MG-UNIT per tablet Take 1 tablet by mouth daily. 06/28/11  Yes Robyn Haber, MD  Cholecalciferol (VITAMIN D3) 1000 UNITS CAPS Take 1 capsule (1,000 Units total) by mouth daily. 01/09/13  Yes Robyn Haber, MD  citalopram (CELEXA) 20 MG tablet Take 1 tablet (20 mg total) by mouth daily. 05/13/13  Yes Robyn Haber, MD  Coenzyme Q10 (CO Q-10) 100 MG CAPS Take 1 tablet by mouth daily. 01/09/13  Yes Robyn Haber, MD  Docusate Calcium (STOOL SOFTENER PO) Take by mouth daily.   Yes Historical Provider, MD  estradiol (ESTRACE) 0.1 MG/GM vaginal cream Place 2 g vaginally 2 (two) times a week.   Yes Historical Provider, MD  glucosamine-chondroitin 500-400 MG tablet Take 1 tablet  by mouth 2 (two) times daily. 01/09/13  Yes Robyn Haber, MD  levothyroxine (SYNTHROID, LEVOTHROID) 112 MCG tablet One daily 05/08/13  Yes Robyn Haber, MD  lisinopril (PRINIVIL,ZESTRIL) 20 MG tablet Take 1 tablet (20 mg total) by mouth daily. 12/04/12  Yes Robyn Haber, MD  LORazepam (ATIVAN) 1 MG tablet Take 1 tablet (1 mg total) by mouth at bedtime and may repeat dose one time if needed. 04/28/13  Yes Robyn Haber, MD  Multiple Vitamins-Minerals (CENTRUM SILVER ADULT 50+) TABS Take 1 tablet by mouth daily. 01/09/13  Yes Robyn Haber, MD  Omega-3 Fatty Acids (FISH OIL) 1200 MG CAPS Take 1 capsule (1,200 mg total) by mouth daily. 01/09/13  Yes Robyn Haber, MD  polyethylene glycol powder (GLYCOLAX/MIRALAX) powder MIX ONE SCOOP (CAPFUL) IN LIQUID AND TAKE ONCE DAILY 04/28/13  Yes Robyn Haber, MD  zolpidem (AMBIEN) 10 MG tablet Take 1 tablet (10 mg total) by mouth at bedtime as needed for sleep. 05/08/13  Yes Robyn Haber, MD  gabapentin (NEURONTIN) 100 MG capsule Take 200 mg by mouth 3 (three) times daily.    Historical Provider, MD  HYDROcodone-acetaminophen (NORCO) 5-325 MG per tablet Take 0.5 tablets by mouth every 6 (six) hours as needed for moderate pain. 05/08/13   Robyn Haber, MD    Allergies:  Allergies  Allergen Reactions   Amitriptyline Hcl    Erythromycin    Metronidazole    Penicillins    Rizatriptan Benzoate    Rosuvastatin    Tetracyclines &  Related     History   Social History   Marital Status: Married    Spouse Name: N/A    Number of Children: N/A   Years of Education: N/A   Occupational History   Not on file.   Social History Main Topics   Smoking status: Never Smoker    Smokeless tobacco: Not on file   Alcohol Use: Not on file   Drug Use: Not on file   Sexual Activity: Not on file   Other Topics Concern   Not on file   Social History Narrative   No narrative on file     Review of Systems: Constitutional: negative  for chills, fever, night sweats, weight changes, or fatigue. HEENT: negative for vision changes, hearing loss, congestion, rhinorrhea, ST, epistaxis, or sinus pressure Cardiovascular: negative for chest pain or palpitations Respiratory: negative for hemoptysis, wheezing, shortness of breath, or cough Abdominal: negative for abdominal pain, nausea, vomiting, diarrhea, or constipation Dermatological: negative for rash Neurologic: negative for headache, dizziness, or syncope. Postive for headache. All other systems reviewed and are otherwise negative with the exception to those above and in the HPI.   Physical Exam: Blood pressure 140/94, pulse 65, temperature 97.6 F (36.4 C), temperature source Oral, resp. rate 18, height 5\' 3"  (1.6 m), weight 150 lb 9.6 oz (68.312 kg), SpO2 96.00%., Body mass index is 26.68 kg/(m^2). General: Well developed, well nourished, in no acute distress. Head: Normocephalic, atraumatic, eyes without discharge, sclera non-icteric, nares are without discharge. Bilateral auditory canals clear, TM's are without perforation, pearly grey and translucent with reflective cone of light bilaterally. Oral cavity moist, posterior pharynx without exudate, erythema, peritonsillar abscess, or post nasal drip.  Neck: Supple. No thyromegaly. Full ROM. No lymphadenopathy. Lungs: Clear bilaterally to auscultation without wheezes, rales, or rhonchi. Breathing is unlabored. Heart: RRR with S1 S2. No murmurs, rubs, or gallops appreciated. Abdomen: Soft, non-tender, non-distended with normoactive bowel sounds. No hepatomegaly. No rebound/guarding. No obvious abdominal masses. Msk:  Strength and tone normal for age. Extremities/Skin: Warm and dry. No clubbing or cyanosis. No edema. No rashes or suspicious lesions. Neuro: Alert and oriented X 3. Moves all extremities spontaneously. Gait is normal. CNII-XII grossly in tact. Psych:  Responds to questions appropriately with a normal affect.     ASSESSMENT AND PLAN:  80 y.o. year old female with Arthritis - Plan: POCT SEDIMENTATION RATE, diclofenac (VOLTAREN) 75 MG EC tablet  Hypertension - Plan: chlorthalidone (HYGROTEN) 100 MG tablet  Depression  Insomnia   Patient is obviously discouraged. Her daughter is hovering over her. It's hard to imagine any medicine bring her out of this and she is resistant to any psychiatric intervention which have offered to her repeatedly. At this point I think it's important to stop the butalbital permanently and try an anti-inflammatory. She's obvious a concern about her high blood pressure and it is reasonable to give her a little bit of a diuretic   Signed, Robyn Haber, MD 05/15/2013 6:01 PM

## 2013-05-16 ENCOUNTER — Other Ambulatory Visit: Payer: Self-pay | Admitting: Family Medicine

## 2013-05-16 ENCOUNTER — Telehealth: Payer: Self-pay

## 2013-05-16 DIAGNOSIS — G44229 Chronic tension-type headache, not intractable: Secondary | ICD-10-CM | POA: Diagnosis not present

## 2013-05-16 DIAGNOSIS — H2589 Other age-related cataract: Secondary | ICD-10-CM | POA: Diagnosis not present

## 2013-05-16 DIAGNOSIS — M1990 Unspecified osteoarthritis, unspecified site: Secondary | ICD-10-CM

## 2013-05-16 NOTE — Telephone Encounter (Signed)
Patient daughter calling about lab results. Doesn't look like they have been reviewed by Dr L. Wants to know if another provider can review and let them know results. Says its important. Cb# [id number] [name])

## 2013-05-16 NOTE — Telephone Encounter (Signed)
Sed rate is elevated. ANA is mildly positive, but hard to say given I did not see them. This is way it is best the provider that sees them reviews the labs. TSH low, Dr. Carlean Jews to address. RF normal.

## 2013-05-16 NOTE — Telephone Encounter (Signed)
Can anyone review until Dr. Carlean Jews comes back in? Thanks

## 2013-05-22 DIAGNOSIS — N8111 Cystocele, midline: Secondary | ICD-10-CM | POA: Diagnosis not present

## 2013-05-26 ENCOUNTER — Ambulatory Visit: Payer: Medicare Other | Attending: Family Medicine

## 2013-05-26 DIAGNOSIS — M1710 Unilateral primary osteoarthritis, unspecified knee: Secondary | ICD-10-CM | POA: Insufficient documentation

## 2013-05-26 DIAGNOSIS — M542 Cervicalgia: Secondary | ICD-10-CM | POA: Insufficient documentation

## 2013-05-26 DIAGNOSIS — R5381 Other malaise: Secondary | ICD-10-CM | POA: Diagnosis not present

## 2013-05-26 DIAGNOSIS — M2560 Stiffness of unspecified joint, not elsewhere classified: Secondary | ICD-10-CM | POA: Insufficient documentation

## 2013-05-26 DIAGNOSIS — IMO0001 Reserved for inherently not codable concepts without codable children: Secondary | ICD-10-CM | POA: Diagnosis not present

## 2013-05-26 DIAGNOSIS — IMO0002 Reserved for concepts with insufficient information to code with codable children: Secondary | ICD-10-CM

## 2013-05-28 ENCOUNTER — Ambulatory Visit: Payer: Medicare Other

## 2013-05-28 DIAGNOSIS — IMO0001 Reserved for inherently not codable concepts without codable children: Secondary | ICD-10-CM | POA: Diagnosis not present

## 2013-06-03 ENCOUNTER — Ambulatory Visit: Payer: Medicare Other

## 2013-06-03 DIAGNOSIS — IMO0001 Reserved for inherently not codable concepts without codable children: Secondary | ICD-10-CM | POA: Diagnosis not present

## 2013-06-05 ENCOUNTER — Other Ambulatory Visit: Payer: Self-pay | Admitting: Family Medicine

## 2013-06-06 ENCOUNTER — Ambulatory Visit: Payer: Medicare Other | Attending: Family Medicine | Admitting: Physical Therapy

## 2013-06-06 DIAGNOSIS — M2560 Stiffness of unspecified joint, not elsewhere classified: Secondary | ICD-10-CM | POA: Diagnosis not present

## 2013-06-06 DIAGNOSIS — R5381 Other malaise: Secondary | ICD-10-CM | POA: Insufficient documentation

## 2013-06-06 DIAGNOSIS — M542 Cervicalgia: Secondary | ICD-10-CM | POA: Diagnosis not present

## 2013-06-06 DIAGNOSIS — Z5189 Encounter for other specified aftercare: Secondary | ICD-10-CM | POA: Insufficient documentation

## 2013-06-10 ENCOUNTER — Ambulatory Visit: Payer: Medicare Other

## 2013-06-10 DIAGNOSIS — M542 Cervicalgia: Secondary | ICD-10-CM | POA: Diagnosis not present

## 2013-06-10 DIAGNOSIS — Z5189 Encounter for other specified aftercare: Secondary | ICD-10-CM | POA: Diagnosis not present

## 2013-06-10 DIAGNOSIS — R5381 Other malaise: Secondary | ICD-10-CM | POA: Diagnosis not present

## 2013-06-10 DIAGNOSIS — M2560 Stiffness of unspecified joint, not elsewhere classified: Secondary | ICD-10-CM | POA: Diagnosis not present

## 2013-06-13 ENCOUNTER — Ambulatory Visit: Payer: Medicare Other | Admitting: Physical Therapy

## 2013-06-13 DIAGNOSIS — M2560 Stiffness of unspecified joint, not elsewhere classified: Secondary | ICD-10-CM | POA: Diagnosis not present

## 2013-06-13 DIAGNOSIS — M542 Cervicalgia: Secondary | ICD-10-CM | POA: Diagnosis not present

## 2013-06-13 DIAGNOSIS — R5381 Other malaise: Secondary | ICD-10-CM | POA: Diagnosis not present

## 2013-06-13 DIAGNOSIS — Z5189 Encounter for other specified aftercare: Secondary | ICD-10-CM | POA: Diagnosis not present

## 2013-06-17 ENCOUNTER — Ambulatory Visit: Payer: Medicare Other | Admitting: Rehabilitation

## 2013-06-17 DIAGNOSIS — R5381 Other malaise: Secondary | ICD-10-CM | POA: Diagnosis not present

## 2013-06-17 DIAGNOSIS — Z5189 Encounter for other specified aftercare: Secondary | ICD-10-CM | POA: Diagnosis not present

## 2013-06-17 DIAGNOSIS — M542 Cervicalgia: Secondary | ICD-10-CM | POA: Diagnosis not present

## 2013-06-17 DIAGNOSIS — M2560 Stiffness of unspecified joint, not elsewhere classified: Secondary | ICD-10-CM | POA: Diagnosis not present

## 2013-06-23 ENCOUNTER — Other Ambulatory Visit: Payer: Self-pay | Admitting: Family Medicine

## 2013-06-23 DIAGNOSIS — K002 Abnormalities of size and form of teeth: Secondary | ICD-10-CM

## 2013-06-23 MED ORDER — LISINOPRIL 10 MG PO TABS: ORAL_TABLET | ORAL | Status: DC

## 2013-06-24 ENCOUNTER — Ambulatory Visit: Payer: Medicare Other | Admitting: Physical Therapy

## 2013-06-24 DIAGNOSIS — Z5189 Encounter for other specified aftercare: Secondary | ICD-10-CM | POA: Diagnosis not present

## 2013-06-24 DIAGNOSIS — M2560 Stiffness of unspecified joint, not elsewhere classified: Secondary | ICD-10-CM | POA: Diagnosis not present

## 2013-06-24 DIAGNOSIS — M542 Cervicalgia: Secondary | ICD-10-CM | POA: Diagnosis not present

## 2013-06-24 DIAGNOSIS — R5381 Other malaise: Secondary | ICD-10-CM | POA: Diagnosis not present

## 2013-06-26 ENCOUNTER — Ambulatory Visit: Payer: Medicare Other | Admitting: Physical Therapy

## 2013-06-26 DIAGNOSIS — Z5189 Encounter for other specified aftercare: Secondary | ICD-10-CM | POA: Diagnosis not present

## 2013-06-26 DIAGNOSIS — M542 Cervicalgia: Secondary | ICD-10-CM | POA: Diagnosis not present

## 2013-06-26 DIAGNOSIS — R5381 Other malaise: Secondary | ICD-10-CM | POA: Diagnosis not present

## 2013-06-26 DIAGNOSIS — M2560 Stiffness of unspecified joint, not elsewhere classified: Secondary | ICD-10-CM | POA: Diagnosis not present

## 2013-07-01 ENCOUNTER — Ambulatory Visit: Payer: Medicare Other | Admitting: Physical Therapy

## 2013-07-01 DIAGNOSIS — R5381 Other malaise: Secondary | ICD-10-CM | POA: Diagnosis not present

## 2013-07-01 DIAGNOSIS — M2560 Stiffness of unspecified joint, not elsewhere classified: Secondary | ICD-10-CM | POA: Diagnosis not present

## 2013-07-01 DIAGNOSIS — M542 Cervicalgia: Secondary | ICD-10-CM | POA: Diagnosis not present

## 2013-07-01 DIAGNOSIS — Z5189 Encounter for other specified aftercare: Secondary | ICD-10-CM | POA: Diagnosis not present

## 2013-07-03 ENCOUNTER — Encounter: Payer: Medicare Other | Admitting: Physical Therapy

## 2013-07-15 ENCOUNTER — Ambulatory Visit: Payer: Medicare Other | Attending: Family Medicine | Admitting: Physical Therapy

## 2013-07-15 DIAGNOSIS — R5381 Other malaise: Secondary | ICD-10-CM | POA: Insufficient documentation

## 2013-07-15 DIAGNOSIS — Z5189 Encounter for other specified aftercare: Secondary | ICD-10-CM | POA: Diagnosis not present

## 2013-07-15 DIAGNOSIS — M2560 Stiffness of unspecified joint, not elsewhere classified: Secondary | ICD-10-CM | POA: Diagnosis not present

## 2013-07-15 DIAGNOSIS — M542 Cervicalgia: Secondary | ICD-10-CM | POA: Diagnosis not present

## 2013-07-22 ENCOUNTER — Telehealth: Payer: Self-pay

## 2013-07-22 DIAGNOSIS — G44229 Chronic tension-type headache, not intractable: Secondary | ICD-10-CM | POA: Diagnosis not present

## 2013-07-22 NOTE — Telephone Encounter (Signed)
Patient was referred to see Dr. Charlestine Night for arthritis. Patients daughter has called their office several times to cancel. Please advise Dr. Trinidad Curet from Dr. Shauna Hugh office, says she just wanted to give you an update of what was going on with patients referral.

## 2013-07-29 DIAGNOSIS — N8111 Cystocele, midline: Secondary | ICD-10-CM | POA: Diagnosis not present

## 2013-08-12 ENCOUNTER — Other Ambulatory Visit: Payer: Self-pay | Admitting: Family Medicine

## 2013-08-20 ENCOUNTER — Other Ambulatory Visit: Payer: Self-pay | Admitting: Family Medicine

## 2013-08-28 ENCOUNTER — Ambulatory Visit (INDEPENDENT_AMBULATORY_CARE_PROVIDER_SITE_OTHER): Payer: Medicare Other | Admitting: Family Medicine

## 2013-08-28 ENCOUNTER — Encounter: Payer: Self-pay | Admitting: Family Medicine

## 2013-08-28 VITALS — BP 120/73 | HR 75 | Temp 97.5°F | Resp 16 | Ht 63.0 in | Wt 154.0 lb

## 2013-08-28 DIAGNOSIS — G4700 Insomnia, unspecified: Secondary | ICD-10-CM

## 2013-08-28 DIAGNOSIS — E039 Hypothyroidism, unspecified: Secondary | ICD-10-CM | POA: Diagnosis not present

## 2013-08-28 DIAGNOSIS — R51 Headache: Secondary | ICD-10-CM

## 2013-08-28 DIAGNOSIS — D539 Nutritional anemia, unspecified: Secondary | ICD-10-CM | POA: Diagnosis not present

## 2013-08-28 LAB — CBC WITH DIFFERENTIAL/PLATELET
Basophils Absolute: 0.1 10*3/uL (ref 0.0–0.1)
Basophils Relative: 1 % (ref 0–1)
Eosinophils Absolute: 0.2 10*3/uL (ref 0.0–0.7)
Eosinophils Relative: 4 % (ref 0–5)
HCT: 33.1 % — ABNORMAL LOW (ref 36.0–46.0)
Hemoglobin: 11.4 g/dL — ABNORMAL LOW (ref 12.0–15.0)
Lymphocytes Relative: 28 % (ref 12–46)
Lymphs Abs: 1.5 10*3/uL (ref 0.7–4.0)
MCH: 29.2 pg (ref 26.0–34.0)
MCHC: 34.4 g/dL (ref 30.0–36.0)
MCV: 84.7 fL (ref 78.0–100.0)
Monocytes Absolute: 0.5 10*3/uL (ref 0.1–1.0)
Monocytes Relative: 9 % (ref 3–12)
Neutro Abs: 3.1 10*3/uL (ref 1.7–7.7)
Neutrophils Relative %: 58 % (ref 43–77)
Platelets: 285 10*3/uL (ref 150–400)
RBC: 3.91 MIL/uL (ref 3.87–5.11)
RDW: 14.1 % (ref 11.5–15.5)
WBC: 5.3 10*3/uL (ref 4.0–10.5)

## 2013-08-28 LAB — COMPLETE METABOLIC PANEL WITH GFR
ALT: 14 U/L (ref 0–35)
AST: 19 U/L (ref 0–37)
Albumin: 3.9 g/dL (ref 3.5–5.2)
Alkaline Phosphatase: 64 U/L (ref 39–117)
BUN: 30 mg/dL — ABNORMAL HIGH (ref 6–23)
CO2: 21 mEq/L (ref 19–32)
Calcium: 9.2 mg/dL (ref 8.4–10.5)
Chloride: 109 mEq/L (ref 96–112)
Creat: 1.38 mg/dL — ABNORMAL HIGH (ref 0.50–1.10)
GFR, Est African American: 40 mL/min — ABNORMAL LOW
GFR, Est Non African American: 35 mL/min — ABNORMAL LOW
Glucose, Bld: 85 mg/dL (ref 70–99)
Potassium: 4.6 mEq/L (ref 3.5–5.3)
Sodium: 139 mEq/L (ref 135–145)
Total Bilirubin: 0.3 mg/dL (ref 0.2–1.2)
Total Protein: 7.3 g/dL (ref 6.0–8.3)

## 2013-08-28 NOTE — Progress Notes (Signed)
This chart was scribed for Anita Haber, MD by Elby Beck, Scribe. This patient was seen in room 25 and the patient's care was started at 10:34 AM.  @[facility] @  Patient ID: Anita Banks MRN: [id number], DOB: [birth year], 80 y.o. Date of Encounter: 08/28/2013, 10:53 AM  Primary Physician: Walker Kehr, MD  Chief Complaint: 6 month follow up  HPI: 80 y.o. year old female with history below presents for a 6 month follow-up. She is accompanied by a relative who is assisting in providing translation from [place] to [place]. She states that she had a CBC obtained at [facility] in January 2015. She states that her hemoglobin was slightly low at that time and her last sedimentation rate was 34. She states that her sedimentation rate has also been 45 in recent months. Pt states that she has been almost headache free since her last visit. She states that she has not been having to take Caffeine pills for her headaches anymore. She has been taking Topamax with relief of her headaches. She also states that she has been sleeping better for the past several months, which she attributes to the Lorazepam that she has been taking. She also states that she believes the Lorazepam has been helping with her history of anxiety, in conjunction with the Mirtazapine that she is taking. She does note that she has been having fatigue and some abdominal bloating recently.    Past Medical History  Diagnosis Date  . Hypertension   . Anxiety   . Migraine   . Arthritis      Home Meds: Prior to Admission medications   Medication Sig Start Date End Date Taking? Authorizing Provider  calcium-vitamin D (OSCAL WITH D) 500-200 MG-UNIT per tablet Take 1 tablet by mouth daily. 06/28/11  Yes Anita Haber, MD  Cholecalciferol (VITAMIN D3) 1000 UNITS CAPS Take 1 capsule (1,000 Units total) by mouth daily. 01/09/13  Yes Anita Haber, MD  Coenzyme Q10 (CO Q-10) 100 MG CAPS Take 1 tablet by mouth daily. 01/09/13  Yes Anita Haber, MD  Docusate Calcium (STOOL SOFTENER PO) Take by mouth daily.   Yes Historical Provider, MD  estradiol (ESTRACE) 0.1 MG/GM vaginal cream Place 2 g vaginally 2 (two) times a week.   Yes Historical Provider, MD  glucosamine-chondroitin 500-400 MG tablet Take 1 tablet by mouth 2 (two) times daily. 01/09/13  Yes Anita Haber, MD  levothyroxine (SYNTHROID, LEVOTHROID) 112 MCG tablet TAKE 1 TABLET EVERY DAY   Yes Anita Haber, MD  lisinopril (PRINIVIL,ZESTRIL) 10 MG tablet 1 in am and 2 in pm 06/23/13  Yes Anita Haber, MD  LORazepam (ATIVAN) 1 MG tablet Take 1 tablet (1 mg total) by mouth at bedtime and may repeat dose one time if needed. 04/28/13  Yes Anita Haber, MD  Multiple Vitamins-Minerals (CENTRUM SILVER ADULT 50+) TABS Take 1 tablet by mouth daily. 01/09/13  Yes Anita Haber, MD  Omega-3 Fatty Acids (FISH OIL) 1200 MG CAPS Take 1 capsule (1,200 mg total) by mouth daily. 01/09/13  Yes Anita Haber, MD  polyethylene glycol powder (GLYCOLAX/MIRALAX) powder MIX ONE SCOOP (CAPFUL) IN LIQUID AND TAKE ONCE DAILY 06/05/13  Yes Anita Haber, MD  topiramate (TOPAMAX) 25 MG tablet Take 25 mg by mouth 2 (two) times daily.   Yes Historical Provider, MD  zolpidem (AMBIEN) 10 MG tablet Take 1 tablet (10 mg total) by mouth at bedtime as needed for sleep. 05/08/13  Yes Anita Haber, MD  chlorthalidone (HYGROTON) 25 MG tablet Take 1 tablet (25 mg  total) by mouth daily. 05/15/13   Anita Haber, MD  diclofenac (VOLTAREN) 75 MG EC tablet Take 1 tablet (75 mg total) by mouth daily with supper. 05/15/13   Anita Haber, MD  HYDROcodone-acetaminophen (NORCO) 5-325 MG per tablet Take 0.5 tablets by mouth every 6 (six) hours as needed for moderate pain. 05/08/13   Anita Haber, MD    Allergies:  Allergies  Allergen Reactions  . Amitriptyline Hcl   . Erythromycin   . Metronidazole   . Penicillins   . Rizatriptan Benzoate   . Rosuvastatin   . Tetracyclines & Related     History    Social History  . Marital Status: Married    Spouse Name: N/A    Number of Children: N/A  . Years of Education: N/A   Occupational History  . Not on file.   Social History Main Topics  . Smoking status: Never Smoker   . Smokeless tobacco: Not on file  . Alcohol Use: Not on file  . Drug Use: Not on file  . Sexual Activity: Not on file   Other Topics Concern  . Not on file   Social History Narrative  . No narrative on file     Review of Systems: Constitutional: negative for chills, fever, night sweats, weight changes, or fatigue  HEENT: negative for vision changes, hearing loss, congestion, rhinorrhea, ST, epistaxis, or sinus pressure Cardiovascular: negative for chest pain or palpitations Respiratory: negative for hemoptysis, wheezing, shortness of breath, or cough Abdominal: negative for abdominal pain, nausea, vomiting, diarrhea, or constipation Dermatological: negative for rash Neurologic: negative for headache, dizziness, or syncope All other systems reviewed and are otherwise negative with the exception to those above and in the HPI.   Physical Exam: Blood pressure 120/73, pulse 75, temperature 97.5 F (36.4 C), resp. rate 16, height 5\' 3"  (1.6 m), weight 154 lb (69.854 kg), SpO2 98.00%., Body mass index is 27.29 kg/(m^2). General: Well developed, well nourished, in no acute distress. Head: Normocephalic, atraumatic, eyes without discharge, sclera non-icteric, nares are without discharge. Bilateral auditory canals clear, TM's are without perforation, pearly grey and translucent with reflective cone of light bilaterally. Oral cavity moist, posterior pharynx without exudate, erythema, peritonsillar abscess, or post nasal drip.  Neck: Supple. No thyromegaly. Full ROM. No lymphadenopathy. Lungs: Clear bilaterally to auscultation without wheezes, rales, or rhonchi. Breathing is unlabored. Heart: RRR with S1 S2. No murmurs, rubs, or gallops appreciated. Abdomen: Soft,  non-tender, non-distended with normoactive bowel sounds. No hepatosplenomegaly. No rebound/guarding. No obvious abdominal masses. Msk:  Strength and tone normal for age. Extremities/Skin: Warm and dry. No clubbing or cyanosis. No edema. No rashes or suspicious lesions. Neuro: Alert and oriented X 3. Moves all extremities spontaneously. Gait is normal. CNII-XII grossly in tact. Psych:  Responds to questions appropriately with a normal affect.    ASSESSMENT AND PLAN:  80 y.o. year old female with Headache(784.0) - Plan: COMPLETE METABOLIC PANEL WITH GFR, TSH, CBC with Differential, Vitamin B12  Insomnia - Plan: COMPLETE METABOLIC PANEL WITH GFR, TSH, CBC with Differential, Vitamin B12  Unspecified hypothyroidism - Plan: COMPLETE METABOLIC PANEL WITH GFR, TSH, CBC with Differential, Vitamin B12   Signed, Anita Haber, MD 08/28/2013 10:53 AM  I personally performed the services described in this documentation, which was scribed in my presence. The recorded information has been reviewed and is accurate.

## 2013-08-29 ENCOUNTER — Other Ambulatory Visit: Payer: Self-pay

## 2013-08-29 LAB — TSH: TSH: 0.101 u[IU]/mL — ABNORMAL LOW (ref 0.350–4.500)

## 2013-08-29 LAB — VITAMIN B12: Vitamin B-12: 715 pg/mL (ref 211–911)

## 2013-08-29 NOTE — Telephone Encounter (Signed)
Pt daughter and son in law came in the office, needing refill. Dr. Lorelei Pont researched and confirmed that this med was refilled to her pharmacy on 08/13/13, called and confirmed this with [facility]. Informed pt daughter.

## 2013-08-29 NOTE — Telephone Encounter (Signed)
Patients daughter states her mothers medication (levothyroxine) was supposed to be refilled. She says she called this morning to request a refill and her mother is completely out. I advised that it takes 24-48 hours to have prescription refilled, and that it probably would not be today. Patient mother became upset, I advised that it is probably not the best idea to wait to the day that the patient takes her last pill to call in a refill because it normally does not happen in the same day.  Patients daughter says her mother was seen yesterday by Dr. Carlean Jews and she wanted to speak with him directly. I advised the patient that he is not here, but I will be more than happy to put a message in.  Patient disconnected call

## 2013-08-31 NOTE — Telephone Encounter (Signed)
Looks like this message was handled and taken care of for the patient.

## 2013-09-23 ENCOUNTER — Other Ambulatory Visit: Payer: Self-pay | Admitting: Family Medicine

## 2013-09-29 DIAGNOSIS — M25569 Pain in unspecified knee: Secondary | ICD-10-CM | POA: Diagnosis not present

## 2013-10-23 ENCOUNTER — Other Ambulatory Visit: Payer: Self-pay | Admitting: *Deleted

## 2013-10-23 DIAGNOSIS — G44229 Chronic tension-type headache, not intractable: Secondary | ICD-10-CM | POA: Diagnosis not present

## 2013-10-23 MED ORDER — MIRTAZAPINE 15 MG PO TABS: ORAL_TABLET | ORAL | Status: DC

## 2013-10-23 NOTE — Telephone Encounter (Signed)
Pharmacy sent request to fill medication on 90 day supply- pt had 4 refills left. Cancelled the 4 refills and sent the 90 day supply.

## 2013-10-28 ENCOUNTER — Other Ambulatory Visit: Payer: Self-pay | Admitting: Family Medicine

## 2013-10-29 ENCOUNTER — Other Ambulatory Visit: Payer: Self-pay | Admitting: Family Medicine

## 2013-10-29 ENCOUNTER — Telehealth: Payer: Self-pay | Admitting: Family Medicine

## 2013-10-29 NOTE — Telephone Encounter (Signed)
Patient's daughter called with an issue with her mother's Lorazepam script. She is unable to get it at the pharmacy. Please advise  [phone]

## 2013-10-29 NOTE — Telephone Encounter (Signed)
Pt daughter calling back stating that her mom needs this medication tonight and would like a call back as soon as possible this is her third call today

## 2013-10-30 ENCOUNTER — Telehealth: Payer: Self-pay | Admitting: Family Medicine

## 2013-10-30 NOTE — Telephone Encounter (Signed)
Faxed prescription for Lorazepam [facility]mg  to [facility] on [address].

## 2013-10-30 NOTE — Telephone Encounter (Signed)
Rx faxed from [phone]- see other phone message

## 2013-11-10 DIAGNOSIS — N8111 Cystocele, midline: Secondary | ICD-10-CM | POA: Diagnosis not present

## 2013-11-21 DIAGNOSIS — Z23 Encounter for immunization: Secondary | ICD-10-CM | POA: Diagnosis not present

## 2013-11-23 ENCOUNTER — Other Ambulatory Visit: Payer: Self-pay | Admitting: Family Medicine

## 2013-11-25 ENCOUNTER — Other Ambulatory Visit: Payer: Self-pay | Admitting: Family Medicine

## 2014-01-05 ENCOUNTER — Other Ambulatory Visit: Payer: Self-pay | Admitting: Family Medicine

## 2014-01-05 DIAGNOSIS — R3589 Other polyuria: Secondary | ICD-10-CM

## 2014-01-05 DIAGNOSIS — R358 Other polyuria: Secondary | ICD-10-CM

## 2014-01-05 MED ORDER — NITROFURANTOIN MONOHYD MACRO 100 MG PO CAPS: 100.0000 mg | ORAL_CAPSULE | Freq: Two times a day (BID) | ORAL | Status: DC

## 2014-01-15 DIAGNOSIS — N760 Acute vaginitis: Secondary | ICD-10-CM | POA: Diagnosis not present

## 2014-01-15 DIAGNOSIS — N8111 Cystocele, midline: Secondary | ICD-10-CM | POA: Diagnosis not present

## 2014-02-01 ENCOUNTER — Other Ambulatory Visit: Payer: Self-pay | Admitting: Family Medicine

## 2014-02-03 ENCOUNTER — Telehealth: Payer: Self-pay | Admitting: *Deleted

## 2014-02-03 NOTE — Telephone Encounter (Signed)
Will you allow this appointment to be scheduled?

## 2014-02-03 NOTE — Telephone Encounter (Signed)
[name] patient's daughter called and wanted [place] to see if Dr. Carlean Jews will let Jennamarie (mother) come in at the same time as Sherin Quarry (father).   Father's appt is on 02/12/2014 @ 1:45 pm.   I stated Dr. Carlean Jews was 100% booked on 02/12/2014.  [place] phone number - [phone]

## 2014-02-03 NOTE — Telephone Encounter (Signed)
I can't see them both if I am fully booked

## 2014-02-04 ENCOUNTER — Other Ambulatory Visit: Payer: Self-pay | Admitting: Physician Assistant

## 2014-02-04 NOTE — Telephone Encounter (Signed)
Notified Natasha and transferred to scheduling to make another appt.

## 2014-02-08 ENCOUNTER — Other Ambulatory Visit: Payer: Self-pay | Admitting: Family Medicine

## 2014-02-19 ENCOUNTER — Other Ambulatory Visit: Payer: Self-pay | Admitting: Physician Assistant

## 2014-02-22 ENCOUNTER — Other Ambulatory Visit: Payer: Self-pay | Admitting: Family Medicine

## 2014-02-26 ENCOUNTER — Encounter: Payer: Self-pay | Admitting: Family Medicine

## 2014-02-26 ENCOUNTER — Ambulatory Visit (INDEPENDENT_AMBULATORY_CARE_PROVIDER_SITE_OTHER): Payer: Medicare Other | Admitting: Family Medicine

## 2014-02-26 VITALS — BP 120/60 | HR 71 | Temp 97.8°F | Resp 16 | Ht 61.75 in | Wt 149.4 lb

## 2014-02-26 DIAGNOSIS — N289 Disorder of kidney and ureter, unspecified: Secondary | ICD-10-CM

## 2014-02-26 DIAGNOSIS — E038 Other specified hypothyroidism: Secondary | ICD-10-CM

## 2014-02-26 DIAGNOSIS — F329 Major depressive disorder, single episode, unspecified: Secondary | ICD-10-CM

## 2014-02-26 DIAGNOSIS — F32A Depression, unspecified: Secondary | ICD-10-CM

## 2014-02-26 DIAGNOSIS — K219 Gastro-esophageal reflux disease without esophagitis: Secondary | ICD-10-CM

## 2014-02-26 LAB — CBC WITH DIFFERENTIAL/PLATELET
Basophils Absolute: 0 10*3/uL (ref 0.0–0.1)
Basophils Relative: 0 % (ref 0–1)
Eosinophils Absolute: 0.1 10*3/uL (ref 0.0–0.7)
Eosinophils Relative: 2 % (ref 0–5)
HCT: 34.2 % — ABNORMAL LOW (ref 36.0–46.0)
Hemoglobin: 11.4 g/dL — ABNORMAL LOW (ref 12.0–15.0)
Lymphocytes Relative: 27 % (ref 12–46)
Lymphs Abs: 1.7 10*3/uL (ref 0.7–4.0)
MCH: 28.7 pg (ref 26.0–34.0)
MCHC: 33.3 g/dL (ref 30.0–36.0)
MCV: 86.1 fL (ref 78.0–100.0)
MPV: 9.6 fL (ref 8.6–12.4)
Monocytes Absolute: 0.5 10*3/uL (ref 0.1–1.0)
Monocytes Relative: 8 % (ref 3–12)
Neutro Abs: 4 10*3/uL (ref 1.7–7.7)
Neutrophils Relative %: 63 % (ref 43–77)
Platelets: 308 10*3/uL (ref 150–400)
RBC: 3.97 MIL/uL (ref 3.87–5.11)
RDW: 14.6 % (ref 11.5–15.5)
WBC: 6.3 10*3/uL (ref 4.0–10.5)

## 2014-02-26 LAB — COMPREHENSIVE METABOLIC PANEL
ALT: 17 U/L (ref 0–35)
AST: 19 U/L (ref 0–37)
Albumin: 4 g/dL (ref 3.5–5.2)
Alkaline Phosphatase: 78 U/L (ref 39–117)
BUN: 37 mg/dL — ABNORMAL HIGH (ref 6–23)
CO2: 21 mEq/L (ref 19–32)
Calcium: 9.3 mg/dL (ref 8.4–10.5)
Chloride: 109 mEq/L (ref 96–112)
Creat: 1.47 mg/dL — ABNORMAL HIGH (ref 0.50–1.10)
Glucose, Bld: 88 mg/dL (ref 70–99)
Potassium: 4.9 mEq/L (ref 3.5–5.3)
Sodium: 137 mEq/L (ref 135–145)
Total Bilirubin: 0.2 mg/dL (ref 0.2–1.2)
Total Protein: 7.1 g/dL (ref 6.0–8.3)

## 2014-02-26 LAB — TSH: TSH: 0.081 u[IU]/mL — ABNORMAL LOW (ref 0.350–4.500)

## 2014-02-26 MED ORDER — RANITIDINE HCL 300 MG PO TABS: 300.0000 mg | ORAL_TABLET | Freq: Every day | ORAL | Status: DC

## 2014-02-26 NOTE — Progress Notes (Signed)
   Subjective:    Patient ID: Anita Banks, female    DOB: 05/02/1927, 81 y.o.   MRN: [id number] This chart was scribed for Robyn Haber, MD by Zola Button, Medical Scribe. This patient was seen in Room 24 and the patient's care was started at 3:39 PM.   HPI HPI Comments: Anita Banks is a 81 y.o. female with a hx of HTN, hypothyroidism, migraine variants, and depression with anxiety who presents to the [facility] for a follow-up. She speaks little [place] and is here with family to help translate. Patient states her mood has been gradually getting better since her husband's fall. She has been taking 25 mg of Topamax with improvement to her HA. She is considering increasing her dose to 50 mg, but she is concerned about interference with her other medications.  Patient is reported to have loss of appetite with GERD symptoms, including belching, for the past 2 weeks. She has been taking omeprazole with improvement to her appetite.  Patient also reports having fatigue and generalized weakness during the mornings that tends to improve throughout the day.   Review of Systems  Constitutional: Positive for appetite change and fatigue.  Neurological: Positive for headaches.       Objective:   Physical Exam CONSTITUTIONAL: Well developed/well nourished; much more cheerful HEAD: Normocephalic/atraumatic EYES: EOM/PERRL ENMT: Mucous membranes moist NECK: supple no meningeal signs SPINE: entire spine nontender CV: S1/S2 noted, no murmurs/rubs/gallops noted LUNGS: Lungs are clear to auscultation bilaterally, no apparent distress ABDOMEN: soft, nontender, no rebound or guarding GU: no cva tenderness NEURO: Pt is awake/alert, moves all extremitiesx4 EXTREMITIES: pulses normal, full ROM SKIN: warm, color normal PSYCH: no abnormalities of mood noted     Assessment & Plan:   This chart was scribed in my presence and reviewed by me personally.    ICD-9-CM  ICD-10-CM   1. Other specified hypothyroidism 244.8 E03.8 CBC with Differential     Comprehensive metabolic panel     TSH  2. Gastroesophageal reflux disease without esophagitis 530.81 K21.9 ranitidine (ZANTAC) 300 MG tablet  3. Depression 311 F32.9      Signed, Robyn Haber, MD

## 2014-03-01 NOTE — Addendum Note (Signed)
Addended by: Constance Goltz on: 03/01/2014 03:55 PM   Modules accepted: Orders, SmartSet

## 2014-03-04 ENCOUNTER — Other Ambulatory Visit: Payer: Self-pay | Admitting: Family Medicine

## 2014-03-05 ENCOUNTER — Ambulatory Visit: Payer: Medicare Other | Admitting: Family Medicine

## 2014-03-09 DIAGNOSIS — M1712 Unilateral primary osteoarthritis, left knee: Secondary | ICD-10-CM | POA: Diagnosis not present

## 2014-03-09 DIAGNOSIS — M7582 Other shoulder lesions, left shoulder: Secondary | ICD-10-CM | POA: Diagnosis not present

## 2014-03-24 ENCOUNTER — Other Ambulatory Visit: Payer: Self-pay | Admitting: Physician Assistant

## 2014-03-25 NOTE — Telephone Encounter (Signed)
Patient's daughter [name] would like to receive a phone call once the medication has been sent to the pharmacy. Patient daughter states that we are taking too long and that her mother is on her last pill. Please call [name] [phone]

## 2014-03-26 NOTE — Telephone Encounter (Signed)
Called to make sure aware RF had been sent in yesterday. She stated they have gotten it and thanked [place]

## 2014-03-28 ENCOUNTER — Other Ambulatory Visit: Payer: Self-pay | Admitting: Family Medicine

## 2014-04-07 DIAGNOSIS — Z1231 Encounter for screening mammogram for malignant neoplasm of breast: Secondary | ICD-10-CM | POA: Diagnosis not present

## 2014-04-10 ENCOUNTER — Other Ambulatory Visit: Payer: Self-pay | Admitting: Family Medicine

## 2014-04-14 DIAGNOSIS — F4321 Adjustment disorder with depressed mood: Secondary | ICD-10-CM | POA: Diagnosis not present

## 2014-04-17 ENCOUNTER — Telehealth: Payer: Self-pay

## 2014-04-17 NOTE — Telephone Encounter (Signed)
Needed an appt.

## 2014-05-06 ENCOUNTER — Other Ambulatory Visit (INDEPENDENT_AMBULATORY_CARE_PROVIDER_SITE_OTHER): Payer: Medicare Other

## 2014-05-06 ENCOUNTER — Ambulatory Visit (INDEPENDENT_AMBULATORY_CARE_PROVIDER_SITE_OTHER): Payer: Medicare Other | Admitting: Internal Medicine

## 2014-05-06 ENCOUNTER — Encounter: Payer: Self-pay | Admitting: Internal Medicine

## 2014-05-06 VITALS — BP 150/82 | HR 73 | Temp 97.5°F | Ht 64.0 in | Wt 151.0 lb

## 2014-05-06 DIAGNOSIS — Z23 Encounter for immunization: Secondary | ICD-10-CM | POA: Diagnosis not present

## 2014-05-06 DIAGNOSIS — F418 Other specified anxiety disorders: Secondary | ICD-10-CM

## 2014-05-06 DIAGNOSIS — E032 Hypothyroidism due to medicaments and other exogenous substances: Secondary | ICD-10-CM

## 2014-05-06 DIAGNOSIS — G43809 Other migraine, not intractable, without status migrainosus: Secondary | ICD-10-CM

## 2014-05-06 DIAGNOSIS — M353 Polymyalgia rheumatica: Secondary | ICD-10-CM

## 2014-05-06 DIAGNOSIS — G4700 Insomnia, unspecified: Secondary | ICD-10-CM | POA: Diagnosis not present

## 2014-05-06 LAB — BASIC METABOLIC PANEL
BUN: 29 mg/dL — ABNORMAL HIGH (ref 6–23)
CO2: 25 meq/L (ref 19–32)
CREATININE: 1.34 mg/dL — AB (ref 0.40–1.20)
Calcium: 9.6 mg/dL (ref 8.4–10.5)
Chloride: 108 mEq/L (ref 96–112)
GFR: 39.98 mL/min — AB (ref >60.00)
Glucose, Bld: 89 mg/dL (ref 70–99)
Potassium: 4.6 mEq/L (ref 3.5–5.1)
Sodium: 137 mEq/L (ref 135–145)

## 2014-05-06 LAB — HEPATIC FUNCTION PANEL
ALBUMIN: 4 g/dL (ref 3.5–5.2)
ALT: 13 U/L (ref 0–35)
AST: 18 U/L (ref 0–37)
Alkaline Phosphatase: 73 U/L (ref 39–117)
Bilirubin, Direct: 0 mg/dL (ref 0.0–0.3)
Total Bilirubin: 0.2 mg/dL (ref 0.2–1.2)
Total Protein: 7.9 g/dL (ref 6.0–8.3)

## 2014-05-06 LAB — SEDIMENTATION RATE: Sed Rate: 57 mm/hr — ABNORMAL HIGH (ref 0–22)

## 2014-05-06 LAB — T4, FREE: Free T4: 0.95 ng/dL (ref 0.60–1.60)

## 2014-05-06 LAB — TSH: TSH: 0.59 u[IU]/mL (ref 0.35–4.50)

## 2014-05-06 MED ORDER — PANTOPRAZOLE SODIUM 40 MG PO TBEC: 40.0000 mg | DELAYED_RELEASE_TABLET | Freq: Every day | ORAL | Status: DC

## 2014-05-06 NOTE — Progress Notes (Signed)
Pre visit review using our clinic review tool, if applicable. No additional management support is needed unless otherwise documented below in the visit note. 

## 2014-05-06 NOTE — Assessment & Plan Note (Signed)
On Mirtazipine and Zolpidem - prn

## 2014-05-06 NOTE — Progress Notes (Signed)
   Subjective:     HPI  New pt - not seen in > 3 years  C/o chronic HA (off fioricet) - meds reviewed. F/u chronic insomnia and chronic GERD. F/u PMR 2007 (no bx). On Lisinopril 20 mg and 10 mg  On Topamax for HAs  F/u incontinence - using a pessory - Dr Benjie Karvonen  BP Readings from Last 3 Encounters:  05/06/14 150/82  02/26/14 120/60  08/28/13 120/73   Wt Readings from Last 3 Encounters:  05/06/14 151 lb (68.493 kg)  02/26/14 149 lb 6.4 oz (67.767 kg)  08/28/13 154 lb (69.854 kg)      Review of Systems  Constitutional: Positive for fatigue. Negative for chills, activity change, appetite change and unexpected weight change.  HENT: Negative for congestion, mouth sores and sinus pressure.   Eyes: Positive for visual disturbance.  Respiratory: Negative for cough, chest tightness, shortness of breath and wheezing.   Cardiovascular: Negative for chest pain, palpitations and leg swelling.  Gastrointestinal: Negative for nausea, vomiting and abdominal pain.  Endocrine: Negative for cold intolerance and polyphagia.  Genitourinary: Negative for dysuria, urgency, frequency, difficulty urinating and vaginal pain.  Musculoskeletal: Negative for back pain, gait problem, neck pain and neck stiffness.  Skin: Negative for pallor and rash.  Neurological: Negative for dizziness, tremors, weakness, light-headedness, numbness and headaches.  Psychiatric/Behavioral: Positive for sleep disturbance. Negative for suicidal ideas, confusion and dysphoric mood. The patient is nervous/anxious.   Depressed     Objective:   Physical Exam  Constitutional: She appears well-developed. No distress.  HENT:  Head: Normocephalic.  Right Ear: External ear normal.  Left Ear: External ear normal.  Nose: Nose normal.  Mouth/Throat: Oropharynx is clear and moist.  Eyes: Conjunctivae are normal. Pupils are equal, round, and reactive to light. Right eye exhibits no discharge. Left eye exhibits no discharge.    Neck: Normal range of motion. Neck supple. No JVD present. No tracheal deviation present. No thyromegaly present.  Cardiovascular: Normal rate, regular rhythm and normal heart sounds.   Pulmonary/Chest: No stridor. No respiratory distress. She has no wheezes.  Abdominal: Soft. Bowel sounds are normal. She exhibits no distension and no mass. There is no tenderness. There is no rebound and no guarding.  Musculoskeletal: She exhibits no edema or tenderness.  Lymphadenopathy:    She has no cervical adenopathy.  Neurological: She displays normal reflexes. No cranial nerve deficit. She exhibits normal muscle tone. Coordination normal.  Skin: No rash noted. No erythema.  Psychiatric: Her behavior is normal. Judgment and thought content normal.    Lab Results  Component Value Date   WBC 6.3 02/26/2014   HGB 11.4* 02/26/2014   HCT 34.2* 02/26/2014   PLT 308 02/26/2014   GLUCOSE 88 02/26/2014   CHOL 254* 11/28/2011   TRIG 257* 11/28/2011   HDL 50 11/28/2011   LDLDIRECT 186.4 02/20/2007   LDLCALC 153* 11/28/2011   ALT 17 02/26/2014   AST 19 02/26/2014   NA 137 02/26/2014   K 4.9 02/26/2014   CL 109 02/26/2014   CREATININE 1.47* 02/26/2014   BUN 37* 02/26/2014   CO2 21 02/26/2014   TSH 0.081* 02/26/2014    I reviewed previous records  >45 min     Assessment & Plan:

## 2014-05-06 NOTE — Assessment & Plan Note (Signed)
On Levothroid Labs 

## 2014-05-06 NOTE — Assessment & Plan Note (Signed)
Dr Domingo Cocking On Topamax

## 2014-05-06 NOTE — Assessment & Plan Note (Signed)
Remote 2007 Resolved Labs

## 2014-05-12 DIAGNOSIS — N8111 Cystocele, midline: Secondary | ICD-10-CM | POA: Diagnosis not present

## 2014-05-12 DIAGNOSIS — N816 Rectocele: Secondary | ICD-10-CM | POA: Diagnosis not present

## 2014-05-12 DIAGNOSIS — N760 Acute vaginitis: Secondary | ICD-10-CM | POA: Diagnosis not present

## 2014-05-14 DIAGNOSIS — F4323 Adjustment disorder with mixed anxiety and depressed mood: Secondary | ICD-10-CM | POA: Diagnosis not present

## 2014-05-18 ENCOUNTER — Other Ambulatory Visit: Payer: Self-pay | Admitting: Family Medicine

## 2014-05-18 DIAGNOSIS — G44229 Chronic tension-type headache, not intractable: Secondary | ICD-10-CM | POA: Diagnosis not present

## 2014-05-25 ENCOUNTER — Other Ambulatory Visit: Payer: Self-pay | Admitting: Dermatology

## 2014-05-25 DIAGNOSIS — C4401 Basal cell carcinoma of skin of lip: Secondary | ICD-10-CM | POA: Diagnosis not present

## 2014-05-25 DIAGNOSIS — D485 Neoplasm of uncertain behavior of skin: Secondary | ICD-10-CM | POA: Diagnosis not present

## 2014-05-26 DIAGNOSIS — N898 Other specified noninflammatory disorders of vagina: Secondary | ICD-10-CM | POA: Diagnosis not present

## 2014-05-26 DIAGNOSIS — N8111 Cystocele, midline: Secondary | ICD-10-CM | POA: Diagnosis not present

## 2014-05-27 DIAGNOSIS — F4323 Adjustment disorder with mixed anxiety and depressed mood: Secondary | ICD-10-CM | POA: Diagnosis not present

## 2014-06-03 ENCOUNTER — Other Ambulatory Visit: Payer: Self-pay | Admitting: Physician Assistant

## 2014-06-03 DIAGNOSIS — R87619 Unspecified abnormal cytological findings in specimens from cervix uteri: Secondary | ICD-10-CM | POA: Diagnosis not present

## 2014-06-03 DIAGNOSIS — T836XXA Infection and inflammatory reaction due to prosthetic device, implant and graft in genital tract, initial encounter: Secondary | ICD-10-CM | POA: Diagnosis not present

## 2014-06-03 DIAGNOSIS — N898 Other specified noninflammatory disorders of vagina: Secondary | ICD-10-CM | POA: Diagnosis not present

## 2014-06-03 DIAGNOSIS — R87610 Atypical squamous cells of undetermined significance on cytologic smear of cervix (ASC-US): Secondary | ICD-10-CM | POA: Diagnosis not present

## 2014-06-12 ENCOUNTER — Other Ambulatory Visit: Payer: Self-pay | Admitting: Family Medicine

## 2014-06-15 ENCOUNTER — Encounter: Payer: Self-pay | Admitting: Internal Medicine

## 2014-06-15 ENCOUNTER — Other Ambulatory Visit: Payer: Self-pay | Admitting: Family Medicine

## 2014-06-16 ENCOUNTER — Other Ambulatory Visit: Payer: Self-pay | Admitting: Internal Medicine

## 2014-06-16 MED ORDER — LISINOPRIL 20 MG PO TABS: 20.0000 mg | ORAL_TABLET | Freq: Every day | ORAL | Status: DC

## 2014-06-16 NOTE — Addendum Note (Signed)
Addended by: Jannette Spanner on: 06/16/2014 10:00 AM   Modules accepted: Orders

## 2014-06-19 DIAGNOSIS — N8111 Cystocele, midline: Secondary | ICD-10-CM | POA: Diagnosis not present

## 2014-06-19 DIAGNOSIS — L929 Granulomatous disorder of the skin and subcutaneous tissue, unspecified: Secondary | ICD-10-CM | POA: Diagnosis not present

## 2014-06-27 ENCOUNTER — Telehealth: Payer: Self-pay

## 2014-06-27 DIAGNOSIS — G4700 Insomnia, unspecified: Secondary | ICD-10-CM

## 2014-06-27 NOTE — Telephone Encounter (Signed)
Pt would like a refill on LORazepam (ATIVAN) 1 MG tablet [[id number]], she has already talked to her pharmacist. Please advise at  [phone]

## 2014-06-29 ENCOUNTER — Telehealth: Payer: Self-pay

## 2014-06-29 MED ORDER — LORAZEPAM 1 MG PO TABS: ORAL_TABLET | ORAL | Status: DC

## 2014-06-29 NOTE — Telephone Encounter (Signed)
Rx faxed

## 2014-07-02 DIAGNOSIS — N816 Rectocele: Secondary | ICD-10-CM | POA: Diagnosis not present

## 2014-07-02 DIAGNOSIS — N765 Ulceration of vagina: Secondary | ICD-10-CM | POA: Diagnosis not present

## 2014-07-02 DIAGNOSIS — T836XXD Infection and inflammatory reaction due to prosthetic device, implant and graft in genital tract, subsequent encounter: Secondary | ICD-10-CM | POA: Diagnosis not present

## 2014-07-02 DIAGNOSIS — N8111 Cystocele, midline: Secondary | ICD-10-CM | POA: Diagnosis not present

## 2014-07-13 DIAGNOSIS — N8111 Cystocele, midline: Secondary | ICD-10-CM | POA: Diagnosis not present

## 2014-07-13 DIAGNOSIS — N816 Rectocele: Secondary | ICD-10-CM | POA: Diagnosis not present

## 2014-07-14 DIAGNOSIS — C4401 Basal cell carcinoma of skin of lip: Secondary | ICD-10-CM | POA: Diagnosis not present

## 2014-07-20 DIAGNOSIS — N816 Rectocele: Secondary | ICD-10-CM | POA: Diagnosis not present

## 2014-07-20 DIAGNOSIS — N8111 Cystocele, midline: Secondary | ICD-10-CM | POA: Diagnosis not present

## 2014-07-29 ENCOUNTER — Other Ambulatory Visit: Payer: Self-pay | Admitting: Family Medicine

## 2014-07-29 ENCOUNTER — Other Ambulatory Visit: Payer: Self-pay | Admitting: Internal Medicine

## 2014-07-29 ENCOUNTER — Other Ambulatory Visit: Payer: Self-pay

## 2014-07-29 MED ORDER — LEVOTHYROXINE SODIUM 112 MCG PO TABS: 112.0000 ug | ORAL_TABLET | Freq: Every day | ORAL | Status: DC

## 2014-07-29 NOTE — Telephone Encounter (Signed)
I approve a refill of this medicine.  Future refill requests should be directed to Dr. Alain Marion, patient's new PCP

## 2014-07-29 NOTE — Telephone Encounter (Signed)
Synthroid rx sent to pharm

## 2014-08-11 DIAGNOSIS — N8111 Cystocele, midline: Secondary | ICD-10-CM | POA: Diagnosis not present

## 2014-09-01 ENCOUNTER — Telehealth: Payer: Self-pay | Admitting: Internal Medicine

## 2014-09-01 NOTE — Telephone Encounter (Signed)
I have scheduled patient to come in on Friday for a rash on face and between fingers.  Daughter is requesting Dr. Alain Marion to work patient in sooner.  Please advise.

## 2014-09-02 NOTE — Telephone Encounter (Signed)
Ok Thur 11:30 Thx

## 2014-09-02 NOTE — Telephone Encounter (Signed)
Pt's daughter, [name] informed. She states she can not come Thursday at 11:30. Advised to keep Ov for 09/04/14 @ 3:45 with PCP.

## 2014-09-04 ENCOUNTER — Ambulatory Visit (INDEPENDENT_AMBULATORY_CARE_PROVIDER_SITE_OTHER): Payer: Medicare Other | Admitting: Internal Medicine

## 2014-09-04 ENCOUNTER — Encounter: Payer: Self-pay | Admitting: Internal Medicine

## 2014-09-04 ENCOUNTER — Other Ambulatory Visit (INDEPENDENT_AMBULATORY_CARE_PROVIDER_SITE_OTHER): Payer: Medicare Other

## 2014-09-04 VITALS — BP 144/88 | HR 70 | Wt 147.0 lb

## 2014-09-04 DIAGNOSIS — E559 Vitamin D deficiency, unspecified: Secondary | ICD-10-CM

## 2014-09-04 DIAGNOSIS — R269 Unspecified abnormalities of gait and mobility: Secondary | ICD-10-CM | POA: Diagnosis not present

## 2014-09-04 DIAGNOSIS — R21 Rash and other nonspecific skin eruption: Secondary | ICD-10-CM | POA: Insufficient documentation

## 2014-09-04 DIAGNOSIS — L570 Actinic keratosis: Secondary | ICD-10-CM | POA: Diagnosis not present

## 2014-09-04 DIAGNOSIS — G4700 Insomnia, unspecified: Secondary | ICD-10-CM

## 2014-09-04 LAB — BASIC METABOLIC PANEL
BUN: 32 mg/dL — AB (ref 6–23)
CHLORIDE: 109 meq/L (ref 96–112)
CO2: 21 meq/L (ref 19–32)
CREATININE: 1.58 mg/dL — AB (ref 0.40–1.20)
Calcium: 9.5 mg/dL (ref 8.4–10.5)
GFR: 33.03 mL/min — ABNORMAL LOW (ref >60.00)
Glucose, Bld: 87 mg/dL (ref 70–99)
Potassium: 5 mEq/L (ref 3.5–5.1)
Sodium: 137 mEq/L (ref 135–145)

## 2014-09-04 LAB — CBC WITH DIFFERENTIAL/PLATELET
Basophils Absolute: 0 10*3/uL (ref 0.0–0.1)
Basophils Relative: 0.5 % (ref 0.0–3.0)
EOS PCT: 2.7 % (ref 0.0–5.0)
Eosinophils Absolute: 0.2 10*3/uL (ref 0.0–0.7)
HCT: 34.9 % — ABNORMAL LOW (ref 36.0–46.0)
HEMOGLOBIN: 12 g/dL (ref 12.0–15.0)
Lymphocytes Relative: 31.6 % (ref 12.0–46.0)
Lymphs Abs: 2 10*3/uL (ref 0.7–4.0)
MCHC: 34.2 g/dL (ref 30.0–36.0)
MCV: 87.6 fl (ref 78.0–100.0)
MONOS PCT: 8.9 % (ref 3.0–12.0)
Monocytes Absolute: 0.5 10*3/uL (ref 0.1–1.0)
NEUTROS ABS: 3.5 10*3/uL (ref 1.4–7.7)
NEUTROS PCT: 56.3 % (ref 43.0–77.0)
Platelets: 285 10*3/uL (ref 150.0–400.0)
RBC: 3.99 Mil/uL (ref 3.87–5.11)
RDW: 13.7 % (ref 11.5–15.5)
WBC: 6.2 10*3/uL (ref 4.0–10.5)

## 2014-09-04 LAB — VITAMIN D 25 HYDROXY (VIT D DEFICIENCY, FRACTURES): VITD: 51.9 ng/mL (ref 30.00–100.00)

## 2014-09-04 NOTE — Progress Notes (Signed)
Pre visit review using our clinic review tool, if applicable. No additional management support is needed unless otherwise documented below in the visit note. 

## 2014-09-04 NOTE — Assessment & Plan Note (Signed)
7/16 Multifactorial PT Labs

## 2014-09-04 NOTE — Progress Notes (Signed)
Subjective:  Patient ID: Anita Banks, female    DOB: 1927-12-21  Age: 81 y.o. MRN: [id number]  CC: No chief complaint on file.   HPI Anita Banks presents for rash on L hand x 1 week - better on Lotrimin C/o unsteady gait. No falls  Outpatient Prescriptions Prior to Visit  Medication Sig Dispense Refill  . Cholecalciferol (VITAMIN D3) 1000 UNITS CAPS Take 1 capsule (1,000 Units total) by mouth daily. 90 capsule 3  . Coenzyme Q10 (CO Q-10) 100 MG CAPS Take 1 tablet by mouth daily. 90 each 3  . estradiol (ESTRACE) 0.1 MG/GM vaginal cream Place 2 g vaginally 2 (two) times a week.    Marland Kitchen glucosamine-chondroitin 500-400 MG tablet Take 1 tablet by mouth 2 (two) times daily. 180 tablet 3  . levothyroxine (SYNTHROID, LEVOTHROID) 112 MCG tablet Take 1 tablet (112 mcg total) by mouth daily. 30 tablet 2  . lisinopril (PRINIVIL,ZESTRIL) 10 MG tablet TAKE 1 TABLET BY MOUTH IN THE MORNING AND 2 TABLETS IN THE EVENING 90 tablet 3  . lisinopril (PRINIVIL,ZESTRIL) 20 MG tablet Take 1 tablet (20 mg total) by mouth daily. 90 tablet 0  . LORazepam (ATIVAN) 1 MG tablet TAKE 1/2 TABLET BY MOUTH AT BEDTIME MARY REPEAT DOSE ONE TIME IF NEEDED 60 tablet 4  . Multiple Vitamins-Minerals (CENTRUM SILVER ADULT 50+) TABS Take 1 tablet by mouth daily. 90 tablet 3  . Omega-3 Fatty Acids (FISH OIL) 1200 MG CAPS Take 1 capsule (1,200 mg total) by mouth daily. 90 capsule 3  . topiramate (TOPAMAX) 25 MG tablet Take 25 mg by mouth 2 (two) times daily.    Marland Kitchen zolpidem (AMBIEN) 10 MG tablet TAKE 1 TABLET BY MOUTH EVERY DAY AS NEEDED FOR SLEEP 90 tablet 1  . mirtazapine (REMERON) 15 MG tablet TAKE 1 TABLET (15 MG TOTAL) BY MOUTH AT BEDTIME. 30 tablet 4  . calcium-vitamin D (OSCAL WITH D) 500-200 MG-UNIT per tablet Take 1 tablet by mouth daily. (Patient not taking: Reported on 09/04/2014) 90 tablet 3  . chlorthalidone (HYGROTON) 25 MG tablet Take 1 tablet (25 mg total) by mouth daily. (Patient not taking: Reported on  09/04/2014) 90 tablet 1  . Docusate Calcium (STOOL SOFTENER PO) Take by mouth daily.    . pantoprazole (PROTONIX) 40 MG tablet Take 1 tablet (40 mg total) by mouth daily. (Patient not taking: Reported on 09/04/2014) 30 tablet 11   No facility-administered medications prior to visit.    ROS Review of Systems  Constitutional: Negative for chills, activity change, appetite change, fatigue and unexpected weight change.  HENT: Negative for congestion, mouth sores and sinus pressure.   Eyes: Negative for visual disturbance.  Respiratory: Negative for cough and chest tightness.   Gastrointestinal: Negative for nausea and abdominal pain.  Genitourinary: Negative for frequency, difficulty urinating and vaginal pain.  Musculoskeletal: Negative for back pain and gait problem.  Skin: Positive for rash. Negative for pallor.  Neurological: Negative for dizziness, tremors, weakness, numbness and headaches.  Psychiatric/Behavioral: Negative for confusion and sleep disturbance.    Objective:  BP 144/88 mmHg  Pulse 70  Wt 147 lb (66.679 kg)  SpO2 96%  BP Readings from Last 3 Encounters:  09/04/14 144/88  05/06/14 150/82  02/26/14 120/60    Wt Readings from Last 3 Encounters:  09/04/14 147 lb (66.679 kg)  05/06/14 151 lb (68.493 kg)  02/26/14 149 lb 6.4 oz (67.767 kg)    Physical Exam  Constitutional: She appears well-developed. No distress.  HENT:  Head: Normocephalic.  Right Ear: External ear normal.  Left Ear: External ear normal.  Nose: Nose normal.  Mouth/Throat: Oropharynx is clear and moist.  Eyes: Conjunctivae are normal. Pupils are equal, round, and reactive to light. Right eye exhibits no discharge. Left eye exhibits no discharge.  Neck: Normal range of motion. Neck supple. No JVD present. No tracheal deviation present. No thyromegaly present.  Cardiovascular: Normal rate, regular rhythm and normal heart sounds.   Pulmonary/Chest: No stridor. No respiratory distress. She has no  wheezes.  Abdominal: Soft. Bowel sounds are normal. She exhibits no distension and no mass. There is no tenderness. There is no rebound and no guarding.  Musculoskeletal: She exhibits no edema or tenderness.  Lymphadenopathy:    She has no cervical adenopathy.  Neurological: She displays normal reflexes. No cranial nerve deficit. She exhibits normal muscle tone. Coordination normal.  Skin: Rash noted. No erythema.  Psychiatric: She has a normal mood and affect. Her behavior is normal. Judgment and thought content normal.  AKs  Lab Results  Component Value Date   WBC 6.2 09/04/2014   HGB 12.0 09/04/2014   HCT 34.9* 09/04/2014   PLT 285.0 09/04/2014   GLUCOSE 87 09/04/2014   CHOL 254* 11/28/2011   TRIG 257* 11/28/2011   HDL 50 11/28/2011   LDLDIRECT 186.4 02/20/2007   LDLCALC 153* 11/28/2011   ALT 13 05/06/2014   AST 18 05/06/2014   NA 137 09/04/2014   K 5.0 09/04/2014   CL 109 09/04/2014   CREATININE 1.58* 09/04/2014   BUN 32* 09/04/2014   CO2 21 09/04/2014   TSH 0.59 05/06/2014    Mm Digital Screening  01/21/2013   CLINICAL DATA:  Screening.  EXAM: DIGITAL SCREENING BILATERAL MAMMOGRAM WITH CAD  COMPARISON:  Previous exam(s).  ACR Breast Density Category b: There are scattered areas of fibroglandular density.  FINDINGS: There are no findings suspicious for malignancy. Images were processed with CAD.  IMPRESSION: No mammographic evidence of malignancy. A result letter of this screening mammogram will be mailed directly to the patient.  RECOMMENDATION: Screening mammogram in one year. (Code:SM-B-01Y)  BI-RADS CATEGORY  1: Negative   Electronically Signed   By: Everlean Alstrom M.D.   On: 01/21/2013 14:53    Procedure Note :     Procedure : Cryosurgery   Indication:    Actinic keratosis(es)   Risks including unsuccessful procedure , bleeding, infection, bruising, scar, a need for a repeat  procedure and others were explained to the patient in detail as well as the benefits.  Informed consent was obtained verbally.   6  lesion(s)  on  Face, neck/chest  was/were treated with liquid nitrogen on a Q-tip in a usual fasion . Band-Aid was applied and antibiotic ointment was given for a later use.   Tolerated well. Complications none.   Postprocedure instructions :     Keep the wounds clean. You can wash them with liquid soap and water. Pat dry with gauze or a Kleenex tissue  Before applying antibiotic ointment and a Band-Aid.   You need to report immediately  if  any signs of infection develop.    Assessment & Plan:   Diagnoses and all orders for this visit:  Gait disorder Orders: -     Ambulatory referral to Physical Therapy -     Vit D  25 hydroxy (rtn osteoporosis monitoring); Future -     Basic metabolic panel; Future -     CBC with Differential/Platelet; Future  Vitamin D  deficiency Orders: -     Vit D  25 hydroxy (rtn osteoporosis monitoring); Future -     Basic metabolic panel; Future -     CBC with Differential/Platelet; Future  Rash and nonspecific skin eruption  I am having Ms. Simcoe maintain her estradiol, Docusate Calcium (STOOL SOFTENER PO), calcium-vitamin D, Vitamin D3, CENTRUM SILVER ADULT 50+, glucosamine-chondroitin, Co Q-10, Fish Oil, chlorthalidone, topiramate, zolpidem, pantoprazole, lisinopril, lisinopril, LORazepam, levothyroxine, and polyethylene glycol powder.  Meds ordered this encounter  Medications  . polyethylene glycol powder (GLYCOLAX/MIRALAX) powder    Sig: MIX 1 SCOOP IN LIQUID AND TAKE ONCE DAILY. PATIENT NEEDS OFFICE VISIT FOR ADDITIONAL REFILLS    Refill:  5     Follow-up: No Follow-up on file.  Walker Kehr, MD

## 2014-09-04 NOTE — Patient Instructions (Signed)
   Postprocedure instructions :     Keep the wounds clean. You can wash them with liquid soap and water. Pat dry with gauze or a Kleenex tissue  Before applying antibiotic ointment and a Band-Aid.   You need to report immediately  if  any signs of infection develop.    

## 2014-09-08 ENCOUNTER — Other Ambulatory Visit: Payer: Self-pay | Admitting: Family Medicine

## 2014-09-10 DIAGNOSIS — N816 Rectocele: Secondary | ICD-10-CM | POA: Diagnosis not present

## 2014-09-10 DIAGNOSIS — N8111 Cystocele, midline: Secondary | ICD-10-CM | POA: Diagnosis not present

## 2014-09-13 ENCOUNTER — Encounter: Payer: Self-pay | Admitting: Internal Medicine

## 2014-09-13 DIAGNOSIS — L570 Actinic keratosis: Secondary | ICD-10-CM | POA: Insufficient documentation

## 2014-09-13 NOTE — Assessment & Plan Note (Signed)
Options discussed See procedure 

## 2014-09-13 NOTE — Assessment & Plan Note (Signed)
On Mirtazipine and Zolpidem - prn

## 2014-09-13 NOTE — Assessment & Plan Note (Signed)
Anita Banks - better Cont w/topical lamisil

## 2014-09-15 DIAGNOSIS — M1712 Unilateral primary osteoarthritis, left knee: Secondary | ICD-10-CM | POA: Diagnosis not present

## 2014-09-15 DIAGNOSIS — M7582 Other shoulder lesions, left shoulder: Secondary | ICD-10-CM | POA: Diagnosis not present

## 2014-09-15 DIAGNOSIS — M545 Low back pain: Secondary | ICD-10-CM | POA: Diagnosis not present

## 2014-09-22 NOTE — Telephone Encounter (Signed)
Close encounter 

## 2014-09-30 ENCOUNTER — Ambulatory Visit (INDEPENDENT_AMBULATORY_CARE_PROVIDER_SITE_OTHER): Payer: Medicare Other | Admitting: Family Medicine

## 2014-09-30 VITALS — BP 145/84 | HR 70 | Temp 97.6°F | Resp 16 | Ht 62.0 in | Wt 148.8 lb

## 2014-09-30 DIAGNOSIS — M7122 Synovial cyst of popliteal space [Baker], left knee: Secondary | ICD-10-CM | POA: Diagnosis not present

## 2014-09-30 MED ORDER — MELOXICAM 7.5 MG PO TABS: 7.5000 mg | ORAL_TABLET | Freq: Every day | ORAL | Status: DC

## 2014-09-30 NOTE — Progress Notes (Signed)
° °  Subjective:    Patient ID: Anita Banks, female    DOB: 08-04-1927, 81 y.o.   MRN: [id number] This chart was scribed for Robyn Haber, MD by Marti Sleigh, Medical Scribe. This patient was seen in Room 13 and the patient's care was started a 7:40 PM.  Chief Complaint  Patient presents with   Follow-up    L knee    HPI HPI Comments: Anita Banks is a 81 y.o. female who presents to [facility] complaining of knee pain that radiates down her leg for the last few months. She has a past hx of knee pain, but in the past that pain has not radiated. In the past she only had pain when she walked, now she has pain all the time including when she lays down.   She reported to another doctor, and was told she had a pinched disk and was prescribed prednisone. She did not take it because in the past when she has taken prednisone her face swelled and she had double vision.    Review of Systems  Constitutional: Negative for fever and chills.  Cardiovascular: Positive for leg swelling.  Musculoskeletal: Positive for joint swelling and gait problem.  Neurological: Positive for weakness (Secondary to pain). Negative for numbness.       Objective:   Physical Exam  Constitutional: She is oriented to person, place, and time. She appears well-developed and well-nourished. No distress.  HENT:  Head: Normocephalic and atraumatic.  Eyes: Pupils are equal, round, and reactive to light.  Neck: Neck supple.  Cardiovascular: Normal rate.   Pulmonary/Chest: Effort normal. No respiratory distress.  Musculoskeletal: Normal range of motion. She exhibits edema.  Fullness in the popliteal area.  Neurological: She is alert and oriented to person, place, and time. Coordination normal.  Skin: Skin is warm and dry. She is not diaphoretic.  Psychiatric: She has a normal mood and affect. Her behavior is normal.  Nursing note and vitals reviewed.  Negative straight leg raising, full range of motion of  knee and ankle Filed Vitals:   09/30/14 1924  BP: 145/84  Pulse: 70  Temp: 97.6 F (36.4 C)  TempSrc: Oral  Resp: 16  Height: 5\' 2"  (1.575 m)  Weight: 148 lb 12.8 oz (67.495 kg)  SpO2: 98%   I spent 35 minutes face-to-face with patient and son-in-law reviewing history, physical, and medication options.    Assessment & Plan:   This chart was scribed in my presence and reviewed by me personally.    ICD-9-CM ICD-10-CM   1. Baker's cyst of knee, left 727.51 M71.22 meloxicam (MOBIC) 7.5 MG tablet     Signed, Robyn Haber, MD

## 2014-10-09 ENCOUNTER — Other Ambulatory Visit: Payer: Self-pay | Admitting: Physician Assistant

## 2014-10-13 ENCOUNTER — Other Ambulatory Visit: Payer: Self-pay | Admitting: Physician Assistant

## 2014-10-14 ENCOUNTER — Other Ambulatory Visit: Payer: Self-pay | Admitting: Physician Assistant

## 2014-10-14 NOTE — Telephone Encounter (Signed)
Receiver call from pt daughter stating mom has two prescription that was rx by another doctor which she is no longer seeing. Wanting to see if md will refill mom generic Remeron and Zolpidem. Is this ok to refill...Johny Chess

## 2014-10-15 NOTE — Telephone Encounter (Signed)
Dr L, you just saw pt but this med not discussed recently in notes. Do you want to give RFs?

## 2014-10-16 ENCOUNTER — Other Ambulatory Visit: Payer: Self-pay | Admitting: Internal Medicine

## 2014-10-22 DIAGNOSIS — N898 Other specified noninflammatory disorders of vagina: Secondary | ICD-10-CM | POA: Diagnosis not present

## 2014-10-22 DIAGNOSIS — N8111 Cystocele, midline: Secondary | ICD-10-CM | POA: Diagnosis not present

## 2014-10-22 DIAGNOSIS — N816 Rectocele: Secondary | ICD-10-CM | POA: Diagnosis not present

## 2014-11-10 ENCOUNTER — Other Ambulatory Visit: Payer: Self-pay | Admitting: Family Medicine

## 2014-11-15 ENCOUNTER — Other Ambulatory Visit: Payer: Self-pay | Admitting: Family Medicine

## 2014-11-18 ENCOUNTER — Other Ambulatory Visit: Payer: Self-pay | Admitting: Internal Medicine

## 2014-12-06 DIAGNOSIS — Z23 Encounter for immunization: Secondary | ICD-10-CM | POA: Diagnosis not present

## 2014-12-11 DIAGNOSIS — N760 Acute vaginitis: Secondary | ICD-10-CM | POA: Diagnosis not present

## 2014-12-30 DIAGNOSIS — R51 Headache: Secondary | ICD-10-CM | POA: Diagnosis not present

## 2014-12-30 DIAGNOSIS — G44229 Chronic tension-type headache, not intractable: Secondary | ICD-10-CM | POA: Diagnosis not present

## 2015-01-05 ENCOUNTER — Ambulatory Visit: Payer: Self-pay | Admitting: Physical Therapy

## 2015-01-05 ENCOUNTER — Other Ambulatory Visit: Payer: Self-pay | Admitting: Internal Medicine

## 2015-01-08 DIAGNOSIS — M170 Bilateral primary osteoarthritis of knee: Secondary | ICD-10-CM | POA: Diagnosis not present

## 2015-01-08 DIAGNOSIS — M25562 Pain in left knee: Secondary | ICD-10-CM | POA: Diagnosis not present

## 2015-01-08 DIAGNOSIS — M25561 Pain in right knee: Secondary | ICD-10-CM | POA: Diagnosis not present

## 2015-01-11 ENCOUNTER — Encounter: Payer: Self-pay | Admitting: Physical Therapy

## 2015-01-11 ENCOUNTER — Ambulatory Visit: Payer: Medicare Other | Attending: Internal Medicine | Admitting: Physical Therapy

## 2015-01-11 DIAGNOSIS — R269 Unspecified abnormalities of gait and mobility: Secondary | ICD-10-CM | POA: Insufficient documentation

## 2015-01-11 DIAGNOSIS — R2681 Unsteadiness on feet: Secondary | ICD-10-CM | POA: Insufficient documentation

## 2015-01-11 DIAGNOSIS — R531 Weakness: Secondary | ICD-10-CM | POA: Diagnosis not present

## 2015-01-11 NOTE — Therapy (Signed)
[facility] [address] [place], [place], [address] Phone: [phone]   Fax:  [phone]  Physical Therapy Evaluation  Patient Details  Name: Anita Banks MRN: [id number] Date of Birth: 07/13/1927 Referring Provider: Lew Dawes, MD  Encounter Date: 01/11/2015      PT End of Session - 01/11/15 1910    Visit Number 1   Number of Visits 17   Date for PT Re-Evaluation 03/12/15   Authorization Type Medicare - GCodes and PN every 10 visits   PT Start Time [id number]   PT Stop Time 1535   PT Time Calculation (min) 48 min   Activity Tolerance Patient tolerated treatment well   Behavior During Therapy [facility] for tasks assessed/performed      Past Medical History  Diagnosis Date  . Hypertension   . Anxiety   . Migraine   . Arthritis     Past Surgical History  Procedure Laterality Date  . Abdominal hysterectomy    . Hemhorroid  2003    There were no vitals filed for this visit.  Visit Diagnosis:  Abnormality of gait - Plan: PT plan of care cert/re-cert  Unsteadiness - Plan: PT plan of care cert/re-cert  Weakness generalized - Plan: PT plan of care cert/re-cert      Subjective Assessment - 01/11/15 1500    Subjective Pt reports decreased balance, needing to hold onto someone while walking for the past 1 year. Pt used to walk "many miles" per day with husband.  A year ago, pt's husband fell, broke his hip, and then needed a w/c for mobility. Husband passed away about a month ago. Pt's daughter feels as though the pt has had a functional decline due to fear of falling (as husband did) and that pt as become inactive since no longer able to exercise with husband. Pt reporting walking tolerance limited by pain in posterior L knee associated with Baker's cyst and OA. Pt not willing to use walker but is open to trying cane for mobility.   Patient is accompained by: Family member;Interpreter  daughter, Anita Banks    Pertinent History migraine headache, polymyalgia rheumatica, L knee OA with Baker's cyst, depression, anxiety   Patient Stated Goals "I want to be more stable and safe and be able to walk without risk to fall. I'd like to be more independent and walk by myself."   Currently in Pain? No/denies            [facility] PT Assessment - 01/11/15 0001    Assessment   Medical Diagnosis gait disorder   Referring Provider Anita Dawes, MD   Onset Date/Surgical Date 01/06/14   Precautions   Precautions Fall   Restrictions   Weight Bearing Restrictions No   Balance Screen   Has the patient fallen in the past 6 months No   Has the patient had a decrease in activity level because of a fear of falling?  Yes   Is the patient reluctant to leave their home because of a fear of falling?  No   Home Environment   Living Environment Private residence   Living Arrangements Alone   Type of Home Apartment   Home Access Level entry   Home Layout One level   Prior Function   Level of Independence Independent   Leisure music (classical)   Sensation   Light Touch Appears Intact   Proprioception Appears Intact   ROM / Strength   AROM / PROM / Strength AROM;Strength;PROM  AROM   Overall AROM  Deficits   Overall AROM Comments L knee extension limited to approx. -3 degrees   PROM   Overall PROM  Deficits   Overall PROM Comments L knee extension limited by approx. 2 degrees with hard endfeel   Strength   Overall Strength Deficits   Overall Strength Comments Grossly WNL with exception of 4-/5 to 4/5 in B hips in all planes.   Transfers   Transfers Sit to Stand;Stand to Sit   Sit to Stand 6: Modified independent (Device/Increase time)   Stand to Sit 6: Modified independent (Device/Increase time)   Transfer Cueing able to perform sit > stand from standard chair without UE use   Ambulation/Gait   Ambulation/Gait Yes   Ambulation/Gait Assistance 5: Supervision   Ambulation Distance (Feet) 345 Feet    Assistive device None   Gait Pattern Step-through pattern;Decreased stance time - left;Decreased stride length;Decreased weight shift to left;Left flexed knee in stance;Decreased trunk rotation   Ambulation Surface Level;Indoor   Gait velocity 2.30 ft/sec   Stairs Yes   Stairs Assistance 5: Supervision   Stair Management Technique Two rails;Step to pattern;Forwards   Number of Stairs 4   Height of Stairs 6   Standardized Balance Assessment   Standardized Balance Assessment Berg Balance Test   Berg Balance Test   Sit to Stand Able to stand without using hands and stabilize independently   Standing Unsupported Able to stand safely 2 minutes   Sitting with Back Unsupported but Feet Supported on Floor or Stool Able to sit safely and securely 2 minutes   Stand to Sit Sits safely with minimal use of hands   Transfers Able to transfer safely, minor use of hands   Standing Unsupported with Eyes Closed Able to stand 10 seconds safely   Standing Ubsupported with Feet Together Able to place feet together independently and stand for 1 minute with supervision   From Standing, Reach Forward with Outstretched Arm Can reach forward >12 cm safely (5")   From Standing Position, Pick up Object from Floor Able to pick up shoe, needs supervision   From Standing Position, Turn to Look Behind Over each Shoulder Turn sideways only but maintains balance   Turn 360 Degrees Able to turn 360 degrees safely but slowly   Standing Unsupported, Alternately Place Feet on Step/Stool Able to complete 4 steps without aid or supervision   Standing Unsupported, One Foot in Front Able to plae foot ahead of the other independently and hold 30 seconds   Standing on One Leg Tries to lift leg/unable to hold 3 seconds but remains standing independently   Total Score 43                           PT Education - 01/11/15 1836    Education provided Yes   Education Details PT eval findings, goals, and POC.    Person(s) Educated Patient;Child(ren)   Methods Explanation   Comprehension Verbalized understanding          PT Short Term Goals - 01/11/15 1852    PT SHORT TERM GOAL #1   Title Pt will perform initial HEP with mod I using paper handout to maximize funcitonal gains made in PT. Target date: 02/08/15   PT SHORT TERM GOAL #2   Title Perform 6MWT to assess functional endurance. Target date: 02/08/15   PT SHORT TERM GOAL #3   Title Pt will improve Berg score from 43/56  to 47/56 to indicate decreased risk of falling. Target date: 02/08/15   PT SHORT TERM GOAL #4   Title Pt will negotiate 4 stairs with single rail and mod I to increase safety with community mobility. Target date: 02/08/15   PT SHORT TERM GOAL #5   Title Pt will report initiation of walking program > / = 3 times per week to increase ambulation tolerance. Target date: 02/08/15   Additional Short Term Goals   Additional Short Term Goals Yes   PT SHORT TERM GOAL #6   Title Pt will improve gait velocity to > 2.62 ft/sec to indicate status of community ambulator. Target date: 02/08/15           PT Long Term Goals - 01/11/15 1838    PT LONG TERM GOAL #1   Title Pt will improve Berg score from 43/56 to 51/56 to indicate significant improvement in functional standing balance. Target date: 03/08/15   PT LONG TERM GOAL #2   Title Pt will improve ABC score from 29.4% to >43% to indicate significant improvement in balance confidence. Target date: 03/08/15   PT LONG TERM GOAL #3   Title Pt will improve 6MWT distance by 201' from baseline to indicate significant improvement in functional endurance. Target date: 03/08/15   PT LONG TERM GOAL #4   Title Pt will ambulate 1,000 over unlevel, paved surfaces with mod I using LRAD to indicate increased stability with community mobility. Target date: 03/08/15   PT LONG TERM GOAL #5   Title Pt will negotiate standard ramp and curb step with mod I using LRAD to indicate increased independence traversing  community obstacles. Target date: 03/08/15   Additional Long Term Goals   Additional Long Term Goals Yes   PT LONG TERM GOAL #6   Title Pt will perform all above aspects of functional mobility with no more than 2-point increase in within-session pain rating to indicate decreased impact of pain on functional activity tolerance. Target date: 03/07/14   PT LONG TERM GOAL #7   Title Pt will report performance of daily walking program to return to prior fitness regimen without fear of falling. Target date: 03/08/15               Plan - 01/11/15 1911    Clinical Impression Statement Pt is an 81 y/o F referred to outpatient neuro PT to address gait instability. PT evaluation reveals the following: gait impairments; impaired standing balance; gait speed suggestive of status of limited community ambulator, Berg score indicative of fall risk, ABC score suggestive of decreased balance confidence, weakness in B hips, decreased activity tolerance, L knee pain, and decreased stability and independence with functional mobility. Pt will benefit from skilled outpatient PT 2x/week for 8 weeks to address said impairments.   Pt will benefit from skilled therapeutic intervention in order to improve on the following deficits Abnormal gait;Decreased activity tolerance;Decreased endurance;Decreased balance;Pain;Hypomobility;Impaired perceived functional ability;Decreased mobility;Decreased knowledge of use of DME;Other (comment)  Pain will be monitored but not directly addressed in PT.   Rehab Potential Good   PT Frequency 2x / week   PT Duration 8 weeks   PT Treatment/Interventions ADLs/Self Care Home Management;DME Instruction;Gait training;Stair training;Functional mobility training;Therapeutic activities;Therapeutic exercise;Balance training;Orthotic Fit/Training;Patient/family education;Neuromuscular re-education;Manual techniques;Vestibular   PT Next Visit Plan Perform 6MWT. Initiate HEP and walking program. If  time, trial assistive devices (pt not open to RW but will consider SPC).   Consulted and Agree with Plan of Care Patient;Family member/caregiver   Family  Member Consulted daughter, Vickki Muff - 01/21/2015 1906    Functional Assessment Tool Used Merrilee Jansky 43/56   Functional Limitation Mobility: Walking and moving around   Mobility: Walking and Moving Around Current Status [phone]) At least 20 percent but less than 40 percent impaired, limited or restricted   Mobility: Walking and Moving Around Goal Status [phone]) At least 1 percent but less than 20 percent impaired, limited or restricted       Problem List Patient Active Problem List   Diagnosis Date Noted  . Actinic keratoses 09/13/2014  . Gait disorder 09/04/2014  . Rash and nonspecific skin eruption 09/04/2014  . Polymyalgia rheumatica ([place]) 04/20/2011  . INSOMNIA, PERSISTENT 06/26/2007  . HYPERCHOLESTEROLEMIA 02/20/2007  . Depression with anxiety 02/20/2007  . Hypothyroidism due to acquired atrophy of thyroid 12/03/2006  . OSTEOARTHRITIS 12/03/2006  . OSTEOPENIA 12/03/2006  . Migraine variant 11/26/2006  . IRRITABLE BOWEL SYNDROME 11/26/2006   Billie Ruddy, PT, DPT [facility] [address] [place], [place], [address] Phone: [phone]   Fax:  [phone] 21-Jan-2015, 7:23 PM   Name: Anita Banks MRN: [id number] Date of Birth: 12-12-1927

## 2015-01-13 DIAGNOSIS — M25562 Pain in left knee: Secondary | ICD-10-CM | POA: Diagnosis not present

## 2015-01-13 DIAGNOSIS — M170 Bilateral primary osteoarthritis of knee: Secondary | ICD-10-CM | POA: Diagnosis not present

## 2015-01-13 DIAGNOSIS — M1712 Unilateral primary osteoarthritis, left knee: Secondary | ICD-10-CM | POA: Diagnosis not present

## 2015-01-13 DIAGNOSIS — R2689 Other abnormalities of gait and mobility: Secondary | ICD-10-CM | POA: Diagnosis not present

## 2015-01-14 ENCOUNTER — Encounter: Payer: Self-pay | Admitting: Physical Therapy

## 2015-01-14 ENCOUNTER — Ambulatory Visit: Payer: Medicare Other | Admitting: Physical Therapy

## 2015-01-14 DIAGNOSIS — R2681 Unsteadiness on feet: Secondary | ICD-10-CM

## 2015-01-14 DIAGNOSIS — R531 Weakness: Secondary | ICD-10-CM | POA: Diagnosis not present

## 2015-01-14 DIAGNOSIS — R269 Unspecified abnormalities of gait and mobility: Secondary | ICD-10-CM

## 2015-01-14 NOTE — Patient Instructions (Signed)
Bridging    Slowly raise buttocks from floor, keeping stomach tight. Hold for 5 seconds. Repeat __10__ times per set. Do __1__ sets per session. Do __1-2__ sessions per day.  http://orth.exer.us/1096   Copyright  VHI. All rights reserved.  Strengthening: Hip Abductor - Resisted    With band looped around both legs above knees, push thighs apart. Hold for 5 seconds. Repeat _10__ times per set. Do _1_ sets per session. Do _1-2_ sessions per day.  http://orth.exer.us/688   Copyright  VHI. All rights reserved.  Clam Shell 45 Degrees    Lying with hips and knees bent, one pillow between knees and ankles. Lift knee. Be sure pelvis does not roll backward. Do not arch back and keep feet together. Hold for 5 seconds before slowly lowering leg back down. Perform with both legs. Do __10_ times, each leg, _1-2__ times per day.  http://ss.exer.us/75   Copyright  VHI. All rights reserved.  Functional Quadriceps: Sit to Stand    Sit on edge of chair/sturdy surface, feet flat on floor. Stand upright, extending knees fully. Repeat __10__ times per set. Do 1__ sets per session. Do _1-2_ sessions per day.  http://orth.exer.us/735   Copyright  VHI. All rights reserved.   Walking Program:  Begin walking for exercise for 5 minutes, 2 times/day, 5-6 days/week.   Progress your walking program by adding 1 minutes to your routine each week (every 5-6 days), as tolerated. When you reach 15 minutes of constant walking, decrease to 1 time a day. Continue to increase the total time every 5-6 days until you reach 30 minutes. Be sure to wear good walking shoes, walk in a safe environment and only progress to your tolerance.

## 2015-01-15 NOTE — Therapy (Signed)
[facility] [address] [place], [place], [address] Phone: [phone]   Fax:  [phone]  Physical Therapy Treatment  Patient Details  Name: Anita Banks MRN: [id number] Date of Birth: Mar 22, 1927 Referring Provider: Lew Dawes, MD  Encounter Date: 01/14/2015      PT End of Session - 01/14/15 1110    Visit Number 2   Number of Visits 17   Date for PT Re-Evaluation 03/12/15   Authorization Type Medicare - GCodes and PN every 10 visits   PT Start Time 1103   PT Stop Time 1149   PT Time Calculation (min) 46 min   Activity Tolerance Patient tolerated treatment well   Behavior During Therapy [facility] for tasks assessed/performed      Past Medical History  Diagnosis Date  . Hypertension   . Anxiety   . Migraine   . Arthritis     Past Surgical History  Procedure Laterality Date  . Abdominal hysterectomy    . Hemhorroid  2003    There were no vitals filed for this visit.  Visit Diagnosis:  Abnormality of gait  Unsteadiness  Weakness generalized      Subjective Assessment - 01/14/15 1107    Subjective No new complaints. No new falls. Does have a new flexogenix brace ordered by Dr Nadara Mustard. Interpreter unsure of pupose of brace, he thinks it is to help with alignment (appears to look like a smaller version of a bledsoe brace that can lock out flexion/extension as well). Pt is independent with donning brace to left leg.                                         Currently in Pain? No/denies   Multiple Pain Sites No            [facility] PT Assessment - 01/14/15 1122    6 Minute Walk- Baseline   6 Minute Walk- Baseline yes   Modified Borg Scale for Dyspnea 1- Very mild shortness of breath   Perceived Rate of Exertion (Borg) 13- Somewhat hard   6 Minute walk- Post Test   6 Minute Walk Post Test --   6 minute walk test results    Aerobic Endurance Distance Walked 996   Endurance additional  comments no device used, decreased cadence as time progressed. antalgic gait pattern with decreased stance on left leg noted. no imbalance noted.                                          Exercise: Educated on and issued strengthening exercises for home. Refer to pt instructions/education for full details/reps.        PT Education - 01/14/15 1154    Education provided Yes   Education Details HEP: bridge, hooklying hip abdct/ER with red band, sidelying clam shell no band and sit<>stands;walking program   Person(s) Educated Patient   Methods Explanation;Demonstration;Verbal cues;Handout   Comprehension Verbalized understanding;Returned demonstration;Need further instruction;Verbal cues required;Tactile cues required          PT Short Term Goals - 01/11/15 1852    PT SHORT TERM GOAL #1   Title Pt will perform initial HEP with mod I using paper handout to maximize funcitonal gains made in PT. Target date: 02/08/15   PT SHORT  TERM GOAL #2   Title Perform 6MWT to assess functional endurance. Target date: 02/08/15   PT SHORT TERM GOAL #3   Title Pt will improve Berg score from 43/56 to 47/56 to indicate decreased risk of falling. Target date: 02/08/15   PT SHORT TERM GOAL #4   Title Pt will negotiate 4 stairs with single rail and mod I to increase safety with community mobility. Target date: 02/08/15   PT SHORT TERM GOAL #5   Title Pt will report initiation of walking program > / = 3 times per week to increase ambulation tolerance. Target date: 02/08/15   Additional Short Term Goals   Additional Short Term Goals Yes   PT SHORT TERM GOAL #6   Title Pt will improve gait velocity to > 2.62 ft/sec to indicate status of community ambulator. Target date: 02/08/15           PT Long Term Goals - 01/11/15 1838    PT LONG TERM GOAL #1   Title Pt will improve Berg score from 43/56 to 51/56 to indicate significant improvement in functional standing balance. Target date: 03/08/15   PT LONG TERM GOAL #2    Title Pt will improve ABC score from 29.4% to >43% to indicate significant improvement in balance confidence. Target date: 03/08/15   PT LONG TERM GOAL #3   Title Pt will improve 6MWT distance by 201' from baseline to indicate significant improvement in functional endurance. Target date: 03/08/15   PT LONG TERM GOAL #4   Title Pt will ambulate 1,000 over unlevel, paved surfaces with mod I using LRAD to indicate increased stability with community mobility. Target date: 03/08/15   PT LONG TERM GOAL #5   Title Pt will negotiate standard ramp and curb step with mod I using LRAD to indicate increased independence traversing community obstacles. Target date: 03/08/15   Additional Long Term Goals   Additional Long Term Goals Yes   PT LONG TERM GOAL #6   Title Pt will perform all above aspects of functional mobility with no more than 2-point increase in within-session pain rating to indicate decreased impact of pain on functional activity tolerance. Target date: 03/07/14   PT LONG TERM GOAL #7   Title Pt will report performance of daily walking program to return to prior fitness regimen without fear of falling. Target date: 03/08/15            Plan - 01/15/15 1624    Clinical Impression Statement Performed 6 minute walk test today to establish her baseline. Also educated pt on and issued exercises for home. Increased time with this due to need of translatorl   Pt will benefit from skilled therapeutic intervention in order to improve on the following deficits Abnormal gait;Decreased activity tolerance;Decreased endurance;Decreased balance;Pain;Hypomobility;Impaired perceived functional ability;Decreased mobility;Decreased knowledge of use of DME;Other (comment)  Pain will be monitored but not directly addressed in PT.   Rehab Potential Good   PT Frequency 2x / week   PT Duration 8 weeks   PT Treatment/Interventions ADLs/Self Care Home Management;DME Instruction;Gait training;Stair training;Functional  mobility training;Therapeutic activities;Therapeutic exercise;Balance training;Orthotic Fit/Training;Patient/family education;Neuromuscular re-education;Manual techniques;Vestibular   PT Next Visit Plan trial assistive devices (pt not open to RW but will consider SPC), continue with strengthening and balance as well.   Consulted and Agree with Plan of Care Patient;Family member/caregiver   Family Member Consulted daughter, Natalya        Problem List Patient Active Problem List   Diagnosis Date Noted  . Actinic  keratoses 09/13/2014  . Gait disorder 09/04/2014  . Rash and nonspecific skin eruption 09/04/2014  . Polymyalgia rheumatica ([place]) 04/20/2011  . INSOMNIA, PERSISTENT 06/26/2007  . HYPERCHOLESTEROLEMIA 02/20/2007  . Depression with anxiety 02/20/2007  . Hypothyroidism due to acquired atrophy of thyroid 12/03/2006  . OSTEOARTHRITIS 12/03/2006  . OSTEOPENIA 12/03/2006  . Migraine variant 11/26/2006  . IRRITABLE BOWEL SYNDROME 11/26/2006    Willow Ora 01/15/2015, 4:27 PM Willow Ora, PTA, [facility] [address] [place], [place] [address] [phone] 01/15/2015, 4:30 PM   Name: Anita Banks MRN: [id number] Date of Birth: 04/02/1927

## 2015-01-18 ENCOUNTER — Encounter: Payer: Self-pay | Admitting: Internal Medicine

## 2015-01-18 ENCOUNTER — Ambulatory Visit (INDEPENDENT_AMBULATORY_CARE_PROVIDER_SITE_OTHER): Payer: Medicare Other | Admitting: Internal Medicine

## 2015-01-18 VITALS — BP 120/80 | HR 81 | Temp 98.2°F | Wt 148.0 lb

## 2015-01-18 DIAGNOSIS — L299 Pruritus, unspecified: Secondary | ICD-10-CM | POA: Diagnosis not present

## 2015-01-18 DIAGNOSIS — G43809 Other migraine, not intractable, without status migrainosus: Secondary | ICD-10-CM | POA: Diagnosis not present

## 2015-01-18 DIAGNOSIS — E034 Atrophy of thyroid (acquired): Secondary | ICD-10-CM

## 2015-01-18 DIAGNOSIS — M170 Bilateral primary osteoarthritis of knee: Secondary | ICD-10-CM | POA: Diagnosis not present

## 2015-01-18 DIAGNOSIS — E038 Other specified hypothyroidism: Secondary | ICD-10-CM

## 2015-01-18 MED ORDER — TRIAMCINOLONE ACETONIDE 0.1 % EX CREA: 1.0000 "application " | TOPICAL_CREAM | Freq: Two times a day (BID) | CUTANEOUS | Status: DC

## 2015-01-18 NOTE — Patient Instructions (Addendum)
Eucerin lotion daily and use before shower Heating blanket

## 2015-01-18 NOTE — Assessment & Plan Note (Signed)
On Levothroid 

## 2015-01-18 NOTE — Assessment & Plan Note (Signed)
B knees L>R Pt will f/u w/Dr Noemi Chapel - due injection Flexogenix discussed - OK w/me if they would like to be referred

## 2015-01-18 NOTE — Assessment & Plan Note (Signed)
On Topamax 

## 2015-01-18 NOTE — Progress Notes (Signed)
Pre visit review using our clinic review tool, if applicable. No additional management support is needed unless otherwise documented below in the visit note. 

## 2015-01-18 NOTE — Progress Notes (Signed)
Subjective:  Patient ID: Anita Banks, female    DOB: 04-22-1927  Age: 81 y.o. MRN: [id number]  CC: No chief complaint on file.   HPI Anita Banks presents for L knee pain w/walking. C/o skin itching - long time. F/u grief  Outpatient Prescriptions Prior to Visit  Medication Sig Dispense Refill  . aspirin 81 MG tablet Take 81 mg by mouth daily.    . Cholecalciferol (VITAMIN D3) 1000 UNITS CAPS Take 1 capsule (1,000 Units total) by mouth daily. 90 capsule 3  . Coenzyme Q10 (CO Q-10) 100 MG CAPS Take 1 tablet by mouth daily. 90 each 3  . estradiol (ESTRACE) 0.1 MG/GM vaginal cream Place 2 g vaginally 2 (two) times a week.    Marland Kitchen glucosamine-chondroitin 500-400 MG tablet Take 1 tablet by mouth 2 (two) times daily. 180 tablet 3  . levothyroxine (SYNTHROID, LEVOTHROID) 112 MCG tablet Take 1 tablet (112 mcg total) by mouth daily before breakfast. PATIENT NEEDS AN OFFICE VISIT FOR ADDITIONAL REFILLS. 30 tablet 0  . lisinopril (PRINIVIL,ZESTRIL) 10 MG tablet TAKE 1 TABLET BY MOUTH IN THE MORNING AND 2 TABLETS IN THE EVENING 90 tablet 3  . lisinopril (PRINIVIL,ZESTRIL) 20 MG tablet Take 1 tablet (20 mg total) by mouth daily. PATIENT NEEDS OFFICE VISIT FOR ADDITIONAL REFILLS 30 tablet 0  . LORazepam (ATIVAN) 1 MG tablet TAKE 1/2 TABLET BY MOUTH AT BEDTIME MARY REPEAT DOSE ONE TIME IF NEEDED 60 tablet 4  . mirtazapine (REMERON) 15 MG tablet Take 1 tablet (15 mg total) by mouth at bedtime. 30 tablet 5  . Multiple Vitamins-Minerals (CENTRUM SILVER ADULT 50+) TABS Take 1 tablet by mouth daily. 90 tablet 3  . Omega-3 Fatty Acids (FISH OIL) 1200 MG CAPS Take 1 capsule (1,200 mg total) by mouth daily. 90 capsule 3  . polyethylene glycol powder (GLYCOLAX/MIRALAX) powder MIX 1 SCOOP IN LIQUID AND TAKE ONCE DAILY. PATIENT NEEDS OFFICE VISIT FOR ADDITIONAL REFILLS  5  . polyethylene glycol powder (GLYCOLAX/MIRALAX) powder MIX 1 SCOOP IN LIQUID AND TAKE ONCE DAILY. PATIENT NEEDS OFFICE VISIT FOR  ADDITIONAL REFILLS 527 g 5  . topiramate (TOPAMAX) 25 MG tablet Take 25 mg by mouth 2 (two) times daily.    Marland Kitchen levothyroxine (SYNTHROID, LEVOTHROID) 112 MCG tablet TAKE 1 TABLET BY MOUTH EVERY DAY 30 tablet 3  . polyethylene glycol powder (GLYCOLAX/MIRALAX) powder MIX 1 SCOOP IN LIQUID AND TAKE ONCE DAILY. PATIENT NEEDS OFFICE VISIT FOR ADDITIONAL REFILLS 527 g 0  . calcium-vitamin D (OSCAL WITH D) 500-200 MG-UNIT per tablet Take 1 tablet by mouth daily. (Patient not taking: Reported on 01/18/2015) 90 tablet 3  . chlorthalidone (HYGROTON) 25 MG tablet Take 1 tablet (25 mg total) by mouth daily. (Patient not taking: Reported on 01/18/2015) 90 tablet 1  . Docusate Calcium (STOOL SOFTENER PO) Take by mouth daily.    . meloxicam (MOBIC) 7.5 MG tablet Take 1 tablet (7.5 mg total) by mouth daily. (Patient not taking: Reported on 01/18/2015) 10 tablet 3  . pantoprazole (PROTONIX) 40 MG tablet Take 1 tablet (40 mg total) by mouth daily. (Patient not taking: Reported on 01/18/2015) 30 tablet 11  . zolpidem (AMBIEN) 10 MG tablet TAKE 1 TABLET BY MOUTH EVERY DAY AS NEEDED FOR SLEEP (Patient not taking: Reported on 01/18/2015) 90 tablet 1   No facility-administered medications prior to visit.    ROS Review of Systems  Constitutional: Negative for chills, activity change, appetite change, fatigue and unexpected weight change.  HENT: Negative for congestion,  mouth sores and sinus pressure.   Eyes: Negative for visual disturbance.  Respiratory: Negative for cough and chest tightness.   Gastrointestinal: Negative for nausea and abdominal pain.  Genitourinary: Negative for frequency, difficulty urinating and vaginal pain.  Musculoskeletal: Positive for gait problem. Negative for back pain and arthralgias.  Skin: Negative for pallor and rash.  Neurological: Negative for dizziness, tremors, weakness, numbness and headaches.  Psychiatric/Behavioral: Negative for suicidal ideas, confusion and sleep disturbance.      Objective:  BP 120/80 mmHg  Pulse 81  Temp(Src) 98.2 F (36.8 C) (Oral)  Wt 148 lb (67.132 kg)  SpO2 96%  BP Readings from Last 3 Encounters:  01/18/15 120/80  09/30/14 145/84  09/04/14 144/88    Wt Readings from Last 3 Encounters:  01/18/15 148 lb (67.132 kg)  09/30/14 148 lb 12.8 oz (67.495 kg)  09/04/14 147 lb (66.679 kg)    Physical Exam  Constitutional: She appears well-developed. No distress.  HENT:  Head: Normocephalic.  Right Ear: External ear normal.  Left Ear: External ear normal.  Nose: Nose normal.  Mouth/Throat: Oropharynx is clear and moist.  Eyes: Conjunctivae are normal. Pupils are equal, round, and reactive to light. Right eye exhibits no discharge. Left eye exhibits no discharge.  Neck: Normal range of motion. Neck supple. No JVD present. No tracheal deviation present. No thyromegaly present.  Cardiovascular: Normal rate, regular rhythm and normal heart sounds.   Pulmonary/Chest: No stridor. No respiratory distress. She has no wheezes.  Abdominal: Soft. Bowel sounds are normal. She exhibits no distension and no mass. There is no tenderness. There is no rebound and no guarding.  Musculoskeletal: She exhibits tenderness. She exhibits no edema.  Lymphadenopathy:    She has no cervical adenopathy.  Neurological: She displays normal reflexes. No cranial nerve deficit. She exhibits normal muscle tone. Coordination normal.  Skin: No rash noted. No erythema.  Psychiatric: Her behavior is normal. Judgment and thought content normal.  L knee is tender w/ROM Sad Looks well  Lab Results  Component Value Date   WBC 6.2 09/04/2014   HGB 12.0 09/04/2014   HCT 34.9* 09/04/2014   PLT 285.0 09/04/2014   GLUCOSE 87 09/04/2014   CHOL 254* 11/28/2011   TRIG 257* 11/28/2011   HDL 50 11/28/2011   LDLDIRECT 186.4 02/20/2007   LDLCALC 153* 11/28/2011   ALT 13 05/06/2014   AST 18 05/06/2014   NA 137 09/04/2014   K 5.0 09/04/2014   CL 109 09/04/2014    CREATININE 1.58* 09/04/2014   BUN 32* 09/04/2014   CO2 21 09/04/2014   TSH 0.59 05/06/2014    Mm Digital Screening  01/21/2013  CLINICAL DATA:  Screening. EXAM: DIGITAL SCREENING BILATERAL MAMMOGRAM WITH CAD COMPARISON:  Previous exam(s). ACR Breast Density Category b: There are scattered areas of fibroglandular density. FINDINGS: There are no findings suspicious for malignancy. Images were processed with CAD. IMPRESSION: No mammographic evidence of malignancy. A result letter of this screening mammogram will be mailed directly to the patient. RECOMMENDATION: Screening mammogram in one year. (Code:SM-B-01Y) BI-RADS CATEGORY  1: Negative Electronically Signed   By: Everlean Alstrom M.D.   On: 01/21/2013 14:53    Assessment & Plan:   There are no diagnoses linked to this encounter. I am having Ms. Delsignore maintain her estradiol, Docusate Calcium (STOOL SOFTENER PO), calcium-vitamin D, Vitamin D3, CENTRUM SILVER ADULT 50+, glucosamine-chondroitin, Co Q-10, Fish Oil, chlorthalidone, topiramate, zolpidem, pantoprazole, LORazepam, polyethylene glycol powder, aspirin, meloxicam, mirtazapine, levothyroxine, lisinopril, polyethylene glycol powder, and  lisinopril.  No orders of the defined types were placed in this encounter.     Follow-up: No Follow-up on file.  Walker Kehr, MD

## 2015-01-18 NOTE — Assessment & Plan Note (Addendum)
12/16 chronic - months ?dyshydrotic condition Doubt systemic illness Triamc in Eucerin 1:10 Labs/tests if needed Humidifier

## 2015-01-19 ENCOUNTER — Ambulatory Visit: Payer: Medicare Other

## 2015-01-19 DIAGNOSIS — R269 Unspecified abnormalities of gait and mobility: Secondary | ICD-10-CM | POA: Diagnosis not present

## 2015-01-19 DIAGNOSIS — R2681 Unsteadiness on feet: Secondary | ICD-10-CM | POA: Diagnosis not present

## 2015-01-19 DIAGNOSIS — R531 Weakness: Secondary | ICD-10-CM

## 2015-01-19 NOTE — Patient Instructions (Signed)
Tandem Walking    Walk with each foot directly in front of other, heel of one foot touching toes of other foot with each step. Both feet straight ahead. Make sure you can touch the counter for safety and have someone near you. Copyright  VHI. All rights reserved.  Side-Stepping    Walk to left side with eyes open. Take even steps, leading with same foot. Make sure each foot lifts off the floor. Repeat in opposite direction. Repeat for __2__ minutes per session. Do _1___ sessions per day.  Make sure you can touch the counter if needed and have someone near you for safety. \ Copyright  VHI. All rights reserved.  PRE GAIT: Marching    March in place by lifting left leg, then right. Alternate. __10_ reps per set, __2_ sets per day, _5__ days per week Hold onto a support if needed and someone near you for safety.  Copyright  VHI. All rights reserved.  Backward    Walk backwards with eyes open. Take even steps, making sure each foot lifts off floor. Repeat for _2___ minutes per session. Do _1___ sessions per day. Make sure you can touch the counter if needed and have someone near you for safety.  Copyright  VHI. All rights reserved.

## 2015-01-19 NOTE — Therapy (Signed)
[facility] [address] [place], [place], [address] Phone: [phone]   Fax:  [phone]  Physical Therapy Treatment  Patient Details  Name: Anita Banks MRN: [id number] Date of Birth: 05/01/1927 Referring Provider: Lew Dawes, MD  Encounter Date: 01/19/2015      PT End of Session - 01/19/15 1755    Visit Number 3   Number of Visits 17   Date for PT Re-Evaluation 03/12/15   Authorization Type Medicare - GCodes and PN every 10 visits   PT Start Time 1405   PT Stop Time 1448   PT Time Calculation (min) 43 min   Activity Tolerance Patient tolerated treatment well   Behavior During Therapy [facility] for tasks assessed/performed      Past Medical History  Diagnosis Date  . Hypertension   . Anxiety   . Migraine   . Arthritis     Past Surgical History  Procedure Laterality Date  . Abdominal hysterectomy    . Hemhorroid  2003    There were no vitals filed for this visit.  Visit Diagnosis:  Abnormality of gait  Unsteadiness  Weakness generalized      Subjective Assessment - 01/19/15 1324    Subjective No chanage in status.  Eager to get going on balance treatment.  Has done current HEP.   Patient is accompained by: Interpreter   Currently in Pain? Yes   Pain Score 5    Pain Location Knee  posterior aspect   Pain Orientation Left   Pain Descriptors / Indicators Aching   Pain Onset More than a month ago   Pain Frequency Constant   Aggravating Factors  walking, bending knee   Pain Relieving Factors extending knee, brace      Gait: Patient ambulated with SPC x 115' and 230' minguard to supervision after demonstration and instruction in technique.  Cues for forward gaze and sequencing.  Negotiated ramp and curb with minguard A and cane with cues for sequence.  Negotiated 4 steps with rail and cane and cues for sequence with minguard A.  Patient educated in benefit of cane for balance and  encouraged to continue to trial before deciding on cane or not.     Neuro-re-ed: Standing near counter patient performed balance activities consisting of partial tandem stand x 30 sec with R vs L foot in back, side stepping, backwards walking, forward march (c/o increased knee pain,) forward tandem gait and alternate taps to 4" step all with minimal UE support and occasional CGA.  Issued to HEP tandem gait, backwards walk, marching in place, and sidestepping.  Emphasized to perform near counter and have daughter standing close initially while practicing at home.                        PT Education - 01/19/15 1805    Education provided Yes   Education Details balance HEP: side stepping, forward tandem gait, backwards walking and marching in place,   Person(s) Educated Patient   Methods Explanation;Demonstration;Handout   Comprehension Verbalized understanding;Returned demonstration;Need further instruction;Verbal cues required          PT Short Term Goals - 01/11/15 1852    PT SHORT TERM GOAL #1   Title Pt will perform initial HEP with mod I using paper handout to maximize funcitonal gains made in PT. Target date: 02/08/15   PT SHORT TERM GOAL #2   Title Perform 6MWT to assess functional endurance. Target date: 02/08/15  PT SHORT TERM GOAL #3   Title Pt will improve Berg score from 43/56 to 47/56 to indicate decreased risk of falling. Target date: 02/08/15   PT SHORT TERM GOAL #4   Title Pt will negotiate 4 stairs with single rail and mod I to increase safety with community mobility. Target date: 02/08/15   PT SHORT TERM GOAL #5   Title Pt will report initiation of walking program > / = 3 times per week to increase ambulation tolerance. Target date: 02/08/15   Additional Short Term Goals   Additional Short Term Goals Yes   PT SHORT TERM GOAL #6   Title Pt will improve gait velocity to > 2.62 ft/sec to indicate status of community ambulator. Target date: 02/08/15            PT Long Term Goals - 01/11/15 1838    PT LONG TERM GOAL #1   Title Pt will improve Berg score from 43/56 to 51/56 to indicate significant improvement in functional standing balance. Target date: 03/08/15   PT LONG TERM GOAL #2   Title Pt will improve ABC score from 29.4% to >43% to indicate significant improvement in balance confidence. Target date: 03/08/15   PT LONG TERM GOAL #3   Title Pt will improve 6MWT distance by 201' from baseline to indicate significant improvement in functional endurance. Target date: 03/08/15   PT LONG TERM GOAL #4   Title Pt will ambulate 1,000 over unlevel, paved surfaces with mod I using LRAD to indicate increased stability with community mobility. Target date: 03/08/15   PT LONG TERM GOAL #5   Title Pt will negotiate standard ramp and curb step with mod I using LRAD to indicate increased independence traversing community obstacles. Target date: 03/08/15   Additional Long Term Goals   Additional Long Term Goals Yes   PT LONG TERM GOAL #6   Title Pt will perform all above aspects of functional mobility with no more than 2-point increase in within-session pain rating to indicate decreased impact of pain on functional activity tolerance. Target date: 03/07/14   PT LONG TERM GOAL #7   Title Pt will report performance of daily walking program to return to prior fitness regimen without fear of falling. Target date: 03/08/15               Plan - 01/19/15 1755    Clinical Impression Statement Progressing with initiation of balance HEP.  Trial of cane with improved balance (did not measure gait speed.)  Patient seems open to it, but skeptical.  Did ask if she needed a walker, but informed cane may be appropriate with more training.   Pt will benefit from skilled therapeutic intervention in order to improve on the following deficits Abnormal gait;Decreased activity tolerance;Decreased endurance;Decreased balance;Pain;Hypomobility;Impaired perceived functional  ability;Decreased mobility;Decreased knowledge of use of DME;Other (comment)  pain monitored but not directlly addressed in PT   Rehab Potential Good   PT Frequency 2x / week   PT Duration 8 weeks   PT Treatment/Interventions ADLs/Self Care Home Management;DME Instruction;Gait training;Stair training;Functional mobility training;Therapeutic activities;Therapeutic exercise;Balance training;Orthotic Fit/Training;Patient/family education;Neuromuscular re-education;Manual techniques;Vestibular   PT Next Visit Plan Continue cane training, try timing velocity with and without.  Review current HEP for strength and balance   Consulted and Agree with Plan of Care Patient        Problem List Patient Active Problem List   Diagnosis Date Noted  . Pruritic condition 01/18/2015  . Actinic keratoses 09/13/2014  . Gait disorder  09/04/2014  . Rash and nonspecific skin eruption 09/04/2014  . Polymyalgia rheumatica ([place]) 04/20/2011  . INSOMNIA, PERSISTENT 06/26/2007  . HYPERCHOLESTEROLEMIA 02/20/2007  . Depression with anxiety 02/20/2007  . Hypothyroidism due to acquired atrophy of thyroid 12/03/2006  . Osteoarthritis 12/03/2006  . OSTEOPENIA 12/03/2006  . Migraine variant 11/26/2006  . IRRITABLE BOWEL SYNDROME 11/26/2006    Reginia Naas 01/19/2015, 6:06 PM  Magda Kiel, PT 01/19/2015   [facility] [address] [place], [place], [address] Phone: [phone]   Fax:  [phone]  Name: Anita Banks MRN: [id number] Date of Birth: January 30, 1928

## 2015-01-22 ENCOUNTER — Ambulatory Visit: Payer: Medicare Other | Admitting: Physical Therapy

## 2015-01-22 ENCOUNTER — Encounter: Payer: Self-pay | Admitting: Physical Therapy

## 2015-01-22 DIAGNOSIS — R531 Weakness: Secondary | ICD-10-CM

## 2015-01-22 DIAGNOSIS — R269 Unspecified abnormalities of gait and mobility: Secondary | ICD-10-CM | POA: Diagnosis not present

## 2015-01-22 DIAGNOSIS — R2681 Unsteadiness on feet: Secondary | ICD-10-CM

## 2015-01-22 NOTE — Therapy (Signed)
[facility] [address] [place], [place], [address] Phone: [phone]   Fax:  [phone]  Physical Therapy Treatment  Patient Details  Name: Anita Banks MRN: [id number] Date of Birth: 21-Jun-1927 Referring Provider: Lew Dawes, MD  Encounter Date: 01/22/2015   01/22/15 1240  PT Visits / Re-Eval  Visit Number 4  Number of Visits 17  Date for PT Re-Evaluation 03/12/15  Authorization  Authorization Type Medicare - GCodes and PN every 10 visits  PT Time Calculation  PT Start Time [id number]  PT Stop Time 1315  PT Time Calculation (min) 40 min  PT - End of Session  Equipment Utilized During Treatment Gait belt  Activity Tolerance Patient tolerated treatment well  Behavior During Therapy [facility] for tasks assessed/performed     Past Medical History  Diagnosis Date  . Hypertension   . Anxiety   . Migraine   . Arthritis     Past Surgical History  Procedure Laterality Date  . Abdominal hysterectomy    . Hemhorroid  2003    There were no vitals filed for this visit.  Visit Diagnosis:  Abnormality of gait  Unsteadiness  Weakness generalized      Subjective Assessment - 01/22/15 1237    Subjective No new complaints. No falls. Has not done the balance ex's sent home last session. Has done the strengthening a little, mostly walking. (Up to 40 minutes 1x day, every day with her caregiver).   Currently in Pain? Yes   Pain Score 3    Pain Location Knee   Pain Orientation Left   Pain Descriptors / Indicators Aching;Sore   Pain Type Chronic pain   Pain Onset More than a month ago   Pain Frequency Constant   Aggravating Factors  walking, bending knee   Pain Relieving Factors resting, brace, extending knee        01/22/15 1240  Transfers  Sit to Stand 6: Modified independent (Device/Increase time)  Stand to Sit 6: Modified independent (Device/Increase time)  Ambulation/Gait  Ambulation/Gait  Yes  Ambulation/Gait Assistance 5: Supervision;4: Min guard  Ambulation/Gait Assistance Details cues on sequencing and use of cane with gait. increased assist needed on complaint surfaces (mats). Pt also demo's decreased gait speed, slow and cautious, on compliant surfaces.                                                           Ambulation Distance (Feet) 530 Feet (x1, 200 x1 (includes multiple laps across all mats))  Assistive device Straight cane  Gait Pattern Step-through pattern;Decreased stance time - left;Decreased stride length;Decreased weight shift to left;Left flexed knee in stance;Decreased trunk rotation  Ambulation Surface Level;Indoor;Unlevel (complaint mats)      Neuro Re-ed: At counter,HEP reveiw with cues on form and technique. Verbal, visual and tactile cues needed with exercises. Cues to not rotate hips/ER legs with side stepping and for increased, even step length. Cues for hold times with marching in place and for posture. Cues for posture with tandem stepping forward. Cues for posture and increased, even steps with backward gait.  Exercises: Scifit x 4 extremities level 1.5 x 8 minutes for strengthening and activity tolerance          PT Short Term Goals - 01/11/15 1852    PT SHORT  TERM GOAL #1   Title Pt will perform initial HEP with mod I using paper handout to maximize funcitonal gains made in PT. Target date: 02/08/15   PT SHORT TERM GOAL #2   Title Perform 6MWT to assess functional endurance. Target date: 02/08/15   PT SHORT TERM GOAL #3   Title Pt will improve Berg score from 43/56 to 47/56 to indicate decreased risk of falling. Target date: 02/08/15   PT SHORT TERM GOAL #4   Title Pt will negotiate 4 stairs with single rail and mod I to increase safety with community mobility. Target date: 02/08/15   PT SHORT TERM GOAL #5   Title Pt will report initiation of walking program > / = 3 times per week to increase ambulation tolerance. Target date: 02/08/15    Additional Short Term Goals   Additional Short Term Goals Yes   PT SHORT TERM GOAL #6   Title Pt will improve gait velocity to > 2.62 ft/sec to indicate status of community ambulator. Target date: 02/08/15           PT Long Term Goals - 01/11/15 1838    PT LONG TERM GOAL #1   Title Pt will improve Berg score from 43/56 to 51/56 to indicate significant improvement in functional standing balance. Target date: 03/08/15   PT LONG TERM GOAL #2   Title Pt will improve ABC score from 29.4% to >43% to indicate significant improvement in balance confidence. Target date: 03/08/15   PT LONG TERM GOAL #3   Title Pt will improve 6MWT distance by 201' from baseline to indicate significant improvement in functional endurance. Target date: 03/08/15   PT LONG TERM GOAL #4   Title Pt will ambulate 1,000 over unlevel, paved surfaces with mod I using LRAD to indicate increased stability with community mobility. Target date: 03/08/15   PT LONG TERM GOAL #5   Title Pt will negotiate standard ramp and curb step with mod I using LRAD to indicate increased independence traversing community obstacles. Target date: 03/08/15   Additional Long Term Goals   Additional Long Term Goals Yes   PT LONG TERM GOAL #6   Title Pt will perform all above aspects of functional mobility with no more than 2-point increase in within-session pain rating to indicate decreased impact of pain on functional activity tolerance. Target date: 03/07/14   PT LONG TERM GOAL #7   Title Pt will report performance of daily walking program to return to prior fitness regimen without fear of falling. Target date: 03/08/15        01/22/15 1240  Plan  Clinical Impression Statement Pt was able to demo correct technique and form with balance HEP after reveiw and practice in today's session. Pt does need continued practice with cane, especially on uneven/compliant surfaces.  Pt will benefit from skilled therapeutic intervention in order to improve on the  following deficits Abnormal gait;Decreased activity tolerance;Decreased endurance;Decreased balance;Pain;Hypomobility;Impaired perceived functional ability;Decreased mobility;Decreased knowledge of use of DME;Other (comment) (pain monitored but not directlly addressed in PT)  Rehab Potential Good  PT Frequency 2x / week  PT Duration 8 weeks  PT Treatment/Interventions ADLs/Self Care Home Management;DME Instruction;Gait training;Stair training;Functional mobility training;Therapeutic activities;Therapeutic exercise;Balance training;Orthotic Fit/Training;Patient/family education;Neuromuscular re-education;Manual techniques;Vestibular  PT Next Visit Plan Continue cane training, try timing velocity with and without.  Review current HEP for strength and balance  Consulted and Agree with Plan of Care Patient    Problem List Patient Active Problem List   Diagnosis Date Noted  .  Pruritic condition 01/18/2015  . Actinic keratoses 09/13/2014  . Gait disorder 09/04/2014  . Rash and nonspecific skin eruption 09/04/2014  . Polymyalgia rheumatica ([place]) 04/20/2011  . INSOMNIA, PERSISTENT 06/26/2007  . HYPERCHOLESTEROLEMIA 02/20/2007  . Depression with anxiety 02/20/2007  . Hypothyroidism due to acquired atrophy of thyroid 12/03/2006  . Osteoarthritis 12/03/2006  . OSTEOPENIA 12/03/2006  . Migraine variant 11/26/2006  . IRRITABLE BOWEL SYNDROME 11/26/2006    Willow Ora 01/22/2015, 1:12 PM  Willow Ora, PTA, [facility] [address] [place], [place] [address] [phone] 01/25/2015, 12:02 AM   Name: Anita Banks MRN: [id number] Date of Birth: 1927/05/03

## 2015-01-26 ENCOUNTER — Ambulatory Visit: Payer: Medicare Other | Admitting: Physical Therapy

## 2015-01-26 ENCOUNTER — Encounter: Payer: Self-pay | Admitting: Physical Therapy

## 2015-01-26 DIAGNOSIS — M1712 Unilateral primary osteoarthritis, left knee: Secondary | ICD-10-CM | POA: Diagnosis not present

## 2015-01-26 DIAGNOSIS — R531 Weakness: Secondary | ICD-10-CM | POA: Diagnosis not present

## 2015-01-26 DIAGNOSIS — R2681 Unsteadiness on feet: Secondary | ICD-10-CM

## 2015-01-26 DIAGNOSIS — M170 Bilateral primary osteoarthritis of knee: Secondary | ICD-10-CM | POA: Diagnosis not present

## 2015-01-26 DIAGNOSIS — M25562 Pain in left knee: Secondary | ICD-10-CM | POA: Diagnosis not present

## 2015-01-26 DIAGNOSIS — R262 Difficulty in walking, not elsewhere classified: Secondary | ICD-10-CM | POA: Diagnosis not present

## 2015-01-26 DIAGNOSIS — R269 Unspecified abnormalities of gait and mobility: Secondary | ICD-10-CM | POA: Diagnosis not present

## 2015-01-28 NOTE — Therapy (Signed)
[facility] [address] [place], [place], [address] Phone: [phone]   Fax:  [phone]  Physical Therapy Treatment  Patient Details  Name: Anita Banks MRN: [id number] Date of Birth: 1927/12/08 Referring Provider: Lew Dawes, MD  Encounter Date: 01/26/2015   01/26/15 1323  PT Visits / Re-Eval  Visit Number 5  Number of Visits 17  Date for PT Re-Evaluation 03/12/15  Authorization  Authorization Type Medicare - GCodes and PN every 10 visits  PT Time Calculation  PT Start Time [id number]  PT Stop Time 1400  PT Time Calculation (min) 43 min  PT - End of Session  Equipment Utilized During Treatment Gait belt  Activity Tolerance Patient tolerated treatment well  Behavior During Therapy [facility] for tasks assessed/performed     Past Medical History  Diagnosis Date  . Hypertension   . Anxiety   . Migraine   . Arthritis     Past Surgical History  Procedure Laterality Date  . Abdominal hysterectomy    . Hemhorroid  2003    There were no vitals filed for this visit.  Visit Diagnosis:  Abnormality of gait  Unsteadiness  Weakness generalized     01/26/15 1321  Symptoms/Limitations  Subjective No new complaints. No falls. Having some left knee pain today. Seeing Dr. Nadara Mustard today for injection into knee.  Patient is accompained by: Interpreter  Pertinent History migraine headache, polymyalgia rheumatica, L knee OA with Baker's cyst, depression, anxiety  Patient Stated Goals "I want to be more stable and safe and be able to walk without risk to fall. I'd like to be more independent and walk by myself."  Pain Assessment  Currently in Pain? Yes  Pain Score 4  Pain Location Knee  Pain Orientation Left  Pain Descriptors / Indicators Aching;Sore  Pain Type Chronic pain  Pain Onset More than a month ago  Pain Frequency Constant  Aggravating Factors  increased activity  Pain Relieving Factors rest,  brace, stretching      01/26/15 1332  Transfers  Sit to Stand 6: Modified independent (Device/Increase time)  Stand to Sit 6: Modified independent (Device/Increase time)  Ambulation/Gait  Ambulation/Gait Yes  Ambulation/Gait Assistance 5: Supervision  Ambulation/Gait Assistance Details occasional cues to correct sequencing with cane with gait;  Ambulation Distance (Feet) 630 Feet (x1; 115 x1 )  Assistive device Straight cane  Gait Pattern Step-through pattern;Decreased stance time - left;Decreased stride length;Decreased weight shift to left;Left flexed knee in stance;Decreased trunk rotation  Ambulation Surface Level;Indoor  Ramp 4: Min assist (min guard with last 2 reps)  Ramp Details (indicate cue type and reason) x 5 reps with cane, cues on sequencing and technique  Curb 4: Min assist (min guard assist with last 2 reps)  Curb Details (indicate cue type and reason) x 5 reps; cues on technique and sequence   Exercises: Had pt perform current HEP for strengthening. Min cues on correct ex form and correct hold times with reps. Reinforced importance of HEP compliance for optimal benefits with therapy.        PT Short Term Goals - 01/11/15 1852    PT SHORT TERM GOAL #1   Title Pt will perform initial HEP with mod I using paper handout to maximize funcitonal gains made in PT. Target date: 02/08/15   PT SHORT TERM GOAL #2   Title Perform 6MWT to assess functional endurance. Target date: 02/08/15   PT SHORT TERM GOAL #3   Title Pt will improve Merrilee Jansky  score from 43/56 to 47/56 to indicate decreased risk of falling. Target date: 02/08/15   PT SHORT TERM GOAL #4   Title Pt will negotiate 4 stairs with single rail and mod I to increase safety with community mobility. Target date: 02/08/15   PT SHORT TERM GOAL #5   Title Pt will report initiation of walking program > / = 3 times per week to increase ambulation tolerance. Target date: 02/08/15   Additional Short Term Goals   Additional Short Term  Goals Yes   PT SHORT TERM GOAL #6   Title Pt will improve gait velocity to > 2.62 ft/sec to indicate status of community ambulator. Target date: 02/08/15           PT Long Term Goals - 01/11/15 1838    PT LONG TERM GOAL #1   Title Pt will improve Berg score from 43/56 to 51/56 to indicate significant improvement in functional standing balance. Target date: 03/08/15   PT LONG TERM GOAL #2   Title Pt will improve ABC score from 29.4% to >43% to indicate significant improvement in balance confidence. Target date: 03/08/15   PT LONG TERM GOAL #3   Title Pt will improve 6MWT distance by 201' from baseline to indicate significant improvement in functional endurance. Target date: 03/08/15   PT LONG TERM GOAL #4   Title Pt will ambulate 1,000 over unlevel, paved surfaces with mod I using LRAD to indicate increased stability with community mobility. Target date: 03/08/15   PT LONG TERM GOAL #5   Title Pt will negotiate standard ramp and curb step with mod I using LRAD to indicate increased independence traversing community obstacles. Target date: 03/08/15   Additional Long Term Goals   Additional Long Term Goals Yes   PT LONG TERM GOAL #6   Title Pt will perform all above aspects of functional mobility with no more than 2-point increase in within-session pain rating to indicate decreased impact of pain on functional activity tolerance. Target date: 03/07/14   PT LONG TERM GOAL #7   Title Pt will report performance of daily walking program to return to prior fitness regimen without fear of falling. Target date: 03/08/15        01/26/15 1323  Plan  Clinical Impression Statement Continued to work on gait with straight cane for improved balance and to decrease knee pain associated with gait. Pt demo's safe use with indoor level surfaces. Needs additional practice with barriers and compliant/outdoor surfaces. Also reinforced importance of being compliant with HEP to achieve full benefit of therapy for  strengthening and balance. Pt making steady progress toward goals.                              Pt will benefit from skilled therapeutic intervention in order to improve on the following deficits Abnormal gait;Decreased activity tolerance;Decreased endurance;Decreased balance;Pain;Hypomobility;Impaired perceived functional ability;Decreased mobility;Decreased knowledge of use of DME;Other (comment) (pain monitored but not directlly addressed in PT)  Rehab Potential Good  PT Frequency 2x / week  PT Duration 8 weeks  PT Treatment/Interventions ADLs/Self Care Home Management;DME Instruction;Gait training;Stair training;Functional mobility training;Therapeutic activities;Therapeutic exercise;Balance training;Orthotic Fit/Training;Patient/family education;Neuromuscular re-education;Manual techniques;Vestibular  PT Next Visit Plan Continue cane training, try timing velocity with and without. Try outdoor surfaces as weather allows. Continue to work on balance and strengthening as well.  Consulted and Agree with Plan of Care Patient     Problem List Patient Active Problem List  Diagnosis Date Noted  . Pruritic condition 01/18/2015  . Actinic keratoses 09/13/2014  . Gait disorder 09/04/2014  . Rash and nonspecific skin eruption 09/04/2014  . Polymyalgia rheumatica ([place]) 04/20/2011  . INSOMNIA, PERSISTENT 06/26/2007  . HYPERCHOLESTEROLEMIA 02/20/2007  . Depression with anxiety 02/20/2007  . Hypothyroidism due to acquired atrophy of thyroid 12/03/2006  . Osteoarthritis 12/03/2006  . OSTEOPENIA 12/03/2006  . Migraine variant 11/26/2006  . IRRITABLE BOWEL SYNDROME 11/26/2006    Willow Ora 01/28/2015, 9:54 AM  Willow Ora, PTA, [facility] [address] [place], [place] [address] [phone] 01/28/2015, 12:15 PM   Name: LOVEAH HAMIDEH MRN: [id number] Date of Birth: March 14, 1927

## 2015-01-29 ENCOUNTER — Telehealth: Payer: Self-pay | Admitting: Internal Medicine

## 2015-01-29 ENCOUNTER — Ambulatory Visit: Payer: Medicare Other | Admitting: Physical Therapy

## 2015-01-29 ENCOUNTER — Other Ambulatory Visit: Payer: Self-pay | Admitting: Family Medicine

## 2015-01-29 DIAGNOSIS — R269 Unspecified abnormalities of gait and mobility: Secondary | ICD-10-CM

## 2015-01-29 DIAGNOSIS — R2681 Unsteadiness on feet: Secondary | ICD-10-CM | POA: Diagnosis not present

## 2015-01-29 DIAGNOSIS — R531 Weakness: Secondary | ICD-10-CM

## 2015-01-29 NOTE — Therapy (Signed)
[facility] [address] [place], [place], [address] Phone: [phone]   Fax:  [phone]  Physical Therapy Treatment  Patient Details  Name: Anita Banks MRN: [id number] Date of Birth: 30-Dec-1927 Referring Provider: Lew Dawes, MD  Encounter Date: 01/29/2015      PT End of Session - 01/29/15 1418    Visit Number 6   Number of Visits 17   Date for PT Re-Evaluation 03/12/15   Authorization Type Medicare - GCodes and PN every 10 visits   PT Start Time 1320   PT Stop Time 1405   PT Time Calculation (min) 45 min   Activity Tolerance Patient tolerated treatment well;No increased pain   Behavior During Therapy [facility] for tasks assessed/performed      Past Medical History  Diagnosis Date  . Hypertension   . Anxiety   . Migraine   . Arthritis     Past Surgical History  Procedure Laterality Date  . Abdominal hysterectomy    . Hemhorroid  2003    There were no vitals filed for this visit.  Visit Diagnosis:  Abnormality of gait  Unsteadiness  Weakness generalized      Subjective Assessment - 01/29/15 1321    Subjective "I walked 40 minutes today," per pt. Pt had cortisone injection in L knee on Tuesday. Pt perceives improvement since injection, but continues to report 4/10 pain in L knee. Pt expressing strong desire to walk without cane.   Patient is accompained by: Interpreter  Sergey   Pertinent History migraine headache, polymyalgia rheumatica, L knee OA with Baker's cyst, depression, anxiety   Patient Stated Goals "I want to be more stable and safe and be able to walk without risk to fall. I'd like to be more independent and walk by myself."   Currently in Pain? Yes   Pain Score 4    Pain Location Knee   Pain Orientation Left   Pain Descriptors / Indicators Aching;Sore   Pain Type Chronic pain   Pain Onset More than a month ago   Pain Frequency Constant   Aggravating Factors   "during and after the walking"   Pain Relieving Factors rest, brace, stretching            [facility] PT Assessment - 01/29/15 0001    Flexibility   Soft Tissue Assessment /Muscle Length yes   Hamstrings decreased extensibility on R  as well as L gastrocnemius   Quadriceps extensibility of L rectus femoris limited, per L Thomas Test                     [facility] Adult PT Treatment/Exercise - 01/29/15 0001    Transfers   Sit to Stand 6: Modified independent (Device/Increase time)   Stand to Sit 6: Modified independent (Device/Increase time)   Ambulation/Gait   Ambulation/Gait Yes   Ambulation/Gait Assistance 5: Supervision;6: Modified independent (Device/Increase time)   Ambulation/Gait Assistance Details Mod I (increased time) for gait over level, indoor surfaces without AD   Ambulation Distance (Feet) 230 Feet   Assistive device None   Gait Pattern Step-through pattern;Decreased stance time - left;Decreased stride length;Decreased weight shift to left;Left flexed knee in stance;Decreased trunk rotation   Ambulation Surface Level;Indoor   Stairs Assistance 6: Modified independent (Device/Increase time)  increased time   Stair Management Technique Step to pattern;Forwards;Two rails   Number of Stairs 4   Height of Stairs 6   Neuro Re-ed    Neuro Re-ed Details  See Balance Treatment section for details.   Exercises   Exercises Other Exercises   Other Exercises  With cueing from PT for technique, alignment, pt performed the following: Supine, hook lying, L rectus femoris self-stretch x3 minutes. Seated L hamstring stretch 4 x30-sec holds. Seated L gastroc self-stretch 3 x30-sec holds.             Balance Exercises - 01/29/15 1414    Balance Exercises: Standing   Standing Eyes Opened Wide (BOA);Head turns;Foam/compliant surface;Other reps (comment);Other (comment)  pillow; horizontal x10; vertical x10 with (S)   Standing Eyes Closed Wide (BOA);Foam/compliant  surface;30 secs;4 reps   SLS Eyes open;4 reps;10 secs   Wall Bumps Hip   Wall Bumps-Hips Eyes opened;Anterior/posterior;20 reps           PT Education - 01/29/15 1408    Education provided Yes   Education Details Added exercises to balance HEP; see Pt Instructions for details.   Person(s) Educated Patient;Other (comment)  Interpreter   Methods Explanation;Demonstration;Handout  Per interpreter, daughter can interpret HEP into [place]   Comprehension Verbalized understanding;Returned demonstration          PT Short Term Goals - 01/29/15 1426    PT SHORT TERM GOAL #1   Title Pt will perform initial HEP with mod I using paper handout to maximize funcitonal gains made in PT. Target date: 02/08/15   Status On-going   PT SHORT TERM GOAL #2   Title Perform 6MWT to assess functional endurance. Target date: 02/08/15   Baseline Per documentation on 12/8: 6MWT distance = 996'   Status Achieved   PT SHORT TERM GOAL #3   Title Pt will improve Berg score from 43/56 to 47/56 to indicate decreased risk of falling. Target date: 02/08/15   Status On-going   PT SHORT TERM GOAL #4   Title Pt will negotiate 4 stairs with single rail and mod I to increase safety with community mobility. Target date: 02/08/15   Status On-going   PT SHORT TERM GOAL #5   Title Pt will report initiation of walking program > / = 3 times per week to increase ambulation tolerance. Target date: 02/08/15   Baseline Met 12/23.   Status Achieved   PT SHORT TERM GOAL #6   Title Pt will improve gait velocity to > 2.62 ft/sec to indicate status of community ambulator. Target date: 02/08/15   Status On-going           PT Long Term Goals - 01/11/15 1838    PT LONG TERM GOAL #1   Title Pt will improve Berg score from 43/56 to 51/56 to indicate significant improvement in functional standing balance. Target date: 03/08/15   PT LONG TERM GOAL #2   Title Pt will improve ABC score from 29.4% to >43% to indicate significant improvement  in balance confidence. Target date: 03/08/15   PT LONG TERM GOAL #3   Title Pt will improve 6MWT distance by 201' from baseline to indicate significant improvement in functional endurance. Target date: 03/08/15   PT LONG TERM GOAL #4   Title Pt will ambulate 1,000 over unlevel, paved surfaces with mod I using LRAD to indicate increased stability with community mobility. Target date: 03/08/15   PT LONG TERM GOAL #5   Title Pt will negotiate standard ramp and curb step with mod I using LRAD to indicate increased independence traversing community obstacles. Target date: 03/08/15   Additional Long Term Goals   Additional Long Term Goals Yes  PT LONG TERM GOAL #6   Title Pt will perform all above aspects of functional mobility with no more than 2-point increase in within-session pain rating to indicate decreased impact of pain on functional activity tolerance. Target date: 03/07/14   PT LONG TERM GOAL #7   Title Pt will report performance of daily walking program to return to prior fitness regimen without fear of falling. Target date: 03/08/15               Plan - 01/29/15 1425    Clinical Impression Statement Session focused on the following: increasing postural stability/control with decreased visual/somatosensory input; increasing extensiblity of L hamstrings/gastroc to promote L knee extension ROM and more normalized gait pattern; increasing hip flexor extensibility for improved postural alignment. Added more balance exercises to HEP.   Pt will benefit from skilled therapeutic intervention in order to improve on the following deficits Abnormal gait;Decreased activity tolerance;Decreased endurance;Decreased balance;Pain;Hypomobility;Impaired perceived functional ability;Decreased mobility;Decreased knowledge of use of DME;Other (comment)  pain monitored but not directlly addressed in PT   Rehab Potential Good   PT Frequency 2x / week   PT Duration 8 weeks   PT Treatment/Interventions ADLs/Self  Care Home Management;DME Instruction;Gait training;Stair training;Functional mobility training;Therapeutic activities;Therapeutic exercise;Balance training;Orthotic Fit/Training;Patient/family education;Neuromuscular re-education;Manual techniques;Vestibular   PT Next Visit Plan Continue cane training, try timing velocity with and without. Try outdoor surfaces as weather allows. Continue to work on balance and strengthening as well.   PT Home Exercise Plan Consider adding stretch for L quadriceps, hamstrings, and gastroc to HEP.   Consulted and Agree with Plan of Care Patient;Other (Comment)   Family Member Consulted Interpreter        Problem List Patient Active Problem List   Diagnosis Date Noted  . Pruritic condition 01/18/2015  . Actinic keratoses 09/13/2014  . Gait disorder 09/04/2014  . Rash and nonspecific skin eruption 09/04/2014  . Polymyalgia rheumatica ([place]) 04/20/2011  . INSOMNIA, PERSISTENT 06/26/2007  . HYPERCHOLESTEROLEMIA 02/20/2007  . Depression with anxiety 02/20/2007  . Hypothyroidism due to acquired atrophy of thyroid 12/03/2006  . Osteoarthritis 12/03/2006  . OSTEOPENIA 12/03/2006  . Migraine variant 11/26/2006  . IRRITABLE BOWEL SYNDROME 11/26/2006   Billie Ruddy, PT, DPT [facility] [address] [place], [place], [address] Phone: [phone]   Fax:  [phone] 01/29/2015, 2:29 PM   Name: Anita Banks MRN: [id number] Date of Birth: [birth year]

## 2015-01-29 NOTE — Telephone Encounter (Signed)
Pt trying to get refill for lisinopril (PRINIVIL,ZESTRIL) 10 MG tablet [id number] and the pharmacy told her she had no refills.  She had a prescription wrote the end of November with 3 refills. It looks like the pharmacy sent a refill request to a different doctor. Pharmacy is [facility] on [address] She will need this before weekend

## 2015-01-29 NOTE — Patient Instructions (Signed)
Feet Apart (Compliant Surface) Varied Arm Positions - Arms by your side    Stand with your back to a corner with a stable chair in front of you. Stand on one pillow with feet shoulder width apart and arms by your ide. Close eyes and visualize upright position. Hold__30__ seconds. Repeat __4__ times per session. Do __2_ sessions per day.  One-Legged Stand    Stand with your back to a corner with a stable chair in front of you. Standing on one leg, try to maintain balance __10__ seconds or as long as possible without support. Repeat on other leg. Repeat __8-10__ times on each leg.  Do __2_ sessions per day. .    Weight Shift: Anterior / Posterior (Righting / Equilibrium)    BEGIN WITH BACK LEANING AGAINST THE WALL AND FEET 4 INCHES AWAY. Move your hips off the wall and come to upright standing.  *Make sure your hips move forward first (rather than your shoulders). Hold for 5 seconds.  Return slowly to the wall letting your hips bump the wall and return to stand.   Hold each position __5__ seconds. Repeat _10__ times per session. Do _5__ sessions per day.

## 2015-01-29 NOTE — Telephone Encounter (Signed)
I called [facility]. The patient has refills on file of Lisinopril 10 mg.  [name] informed to call [facility].

## 2015-02-02 ENCOUNTER — Ambulatory Visit: Payer: Medicare Other | Admitting: Physical Therapy

## 2015-02-02 ENCOUNTER — Encounter: Payer: Self-pay | Admitting: Physical Therapy

## 2015-02-02 DIAGNOSIS — R269 Unspecified abnormalities of gait and mobility: Secondary | ICD-10-CM

## 2015-02-02 DIAGNOSIS — R2681 Unsteadiness on feet: Secondary | ICD-10-CM | POA: Diagnosis not present

## 2015-02-02 DIAGNOSIS — R531 Weakness: Secondary | ICD-10-CM

## 2015-02-03 ENCOUNTER — Other Ambulatory Visit: Payer: Self-pay

## 2015-02-03 DIAGNOSIS — M1712 Unilateral primary osteoarthritis, left knee: Secondary | ICD-10-CM | POA: Diagnosis not present

## 2015-02-03 DIAGNOSIS — M25562 Pain in left knee: Secondary | ICD-10-CM | POA: Diagnosis not present

## 2015-02-03 MED ORDER — MIRTAZAPINE 15 MG PO TABS: 15.0000 mg | ORAL_TABLET | Freq: Every day | ORAL | Status: DC

## 2015-02-03 NOTE — Therapy (Signed)
[facility] [address] [place], [place], [address] Phone: [phone]   Fax:  [phone]  Physical Therapy Treatment  Patient Details  Name: Anita Banks MRN: [id number] Date of Birth: [birth year] Referring Provider: Lew Dawes, MD  Encounter Date: 02/02/2015      PT End of Session - 02/02/15 1538    Visit Number 7   Number of Visits 17   Date for PT Re-Evaluation 03/12/15   Authorization Type Medicare - GCodes and PN every 10 visits   PT Start Time 7622   PT Stop Time 1615   PT Time Calculation (min) 44 min   Equipment Utilized During Treatment Gait belt   Activity Tolerance Patient tolerated treatment well;No increased pain   Behavior During Therapy [facility] for tasks assessed/performed      Past Medical History  Diagnosis Date  . Hypertension   . Anxiety   . Migraine   . Arthritis     Past Surgical History  Procedure Laterality Date  . Abdominal hysterectomy    . Hemhorroid  2003    There were no vitals filed for this visit.  Visit Diagnosis:  Abnormality of gait  Unsteadiness  Weakness generalized      Subjective Assessment - 02/02/15 1533    Subjective Continues to report walking up to 40 minutes today. Having some left knee pain today. No falls. Has done some of the exercies, not all of them. Having difficulty finding an empty corner.   Patient is accompained by: Interpreter  Sergey   Pertinent History migraine headache, polymyalgia rheumatica, L knee OA with Baker's cyst, depression, anxiety   Patient Stated Goals "I want to be more stable and safe and be able to walk without risk to fall. I'd like to be more independent and walk by myself."   Currently in Pain? Yes   Pain Score 3    Pain Location Knee   Pain Orientation Left   Pain Descriptors / Indicators Aching;Sore   Pain Type Chronic pain   Pain Onset More than a month ago   Pain Frequency Constant   Aggravating  Factors  increased activity   Pain Relieving Factors rest, brace and stretching (does not have on brace today)           [facility] Adult PT Treatment/Exercise - 02/02/15 1539    Ambulation/Gait   Ambulation/Gait Yes   Ambulation/Gait Assistance 5: Supervision;6: Modified independent (Device/Increase time)   Ambulation/Gait Assistance Details increased assistance needed on complaint outdoor surfaces, other wise pt was mod I with gait with and without cane (indoor level surfaces).   Ambulation Distance (Feet) 500 Feet   Assistive device Straight cane   Gait Pattern Step-through pattern;Decreased stance time - left;Decreased stride length;Decreased weight shift to left;Left flexed knee in stance;Decreased trunk rotation   Ambulation Surface Level;Unlevel;Indoor;Outdoor;Paved;Gravel;Grass             Balance Exercises - 02/02/15 1600    Balance Exercises: Standing   Standing Eyes Opened Wide (BOA);Foam/compliant surface;Other reps (comment);Limitations   Standing Eyes Closed Wide (BOA);Narrow base of support (BOS);Foam/compliant surface;Other reps (comment);Limitations   Balance Beam blue foam beam: seated with feet across beam- sit<>stands with light UE support  2 sets of 10 reps, min guard to min assist. cues on posture and midline positioning with equal weight bearing. standing with feet across beam: alternating forward heel taps and backward toe taps with min to mod HHA for balance and cues on weight shift and increased step  height off/onto beam to clear foot, not catch it on beam.                                  Balance Exercises: Standing   Standing Eyes Opened Limitations on air ex: feet apart and eyes open- head turns and nods x 10 each, diagonals both ways x 10 each way. min to mod assist for balance.                              Standing Eyes Closed Limitations with wide base of support: no head movements with EC 20 sec's x 3 reps;head nods/shakes x 10 each. narrow base of support:  EC no head movements 20 sec's x 3 reps with min to mod assist for balance.                                                  PT Short Term Goals - 01/29/15 1426    PT SHORT TERM GOAL #1   Title Pt will perform initial HEP with mod I using paper handout to maximize funcitonal gains made in PT. Target date: 02/08/15   Status On-going   PT SHORT TERM GOAL #2   Title Perform 6MWT to assess functional endurance. Target date: 02/08/15   Baseline Per documentation on 12/8: 6MWT distance = 996'   Status Achieved   PT SHORT TERM GOAL #3   Title Pt will improve Berg score from 43/56 to 47/56 to indicate decreased risk of falling. Target date: 02/08/15   Status On-going   PT SHORT TERM GOAL #4   Title Pt will negotiate 4 stairs with single rail and mod I to increase safety with community mobility. Target date: 02/08/15   Status On-going   PT SHORT TERM GOAL #5   Title Pt will report initiation of walking program > / = 3 times per week to increase ambulation tolerance. Target date: 02/08/15   Baseline Met 12/23.   Status Achieved   PT SHORT TERM GOAL #6   Title Pt will improve gait velocity to > 2.62 ft/sec to indicate status of community ambulator. Target date: 02/08/15   Status On-going           PT Long Term Goals - 01/11/15 1838    PT LONG TERM GOAL #1   Title Pt will improve Berg score from 43/56 to 51/56 to indicate significant improvement in functional standing balance. Target date: 03/08/15   PT LONG TERM GOAL #2   Title Pt will improve ABC score from 29.4% to >43% to indicate significant improvement in balance confidence. Target date: 03/08/15   PT LONG TERM GOAL #3   Title Pt will improve 6MWT distance by 201' from baseline to indicate significant improvement in functional endurance. Target date: 03/08/15   PT LONG TERM GOAL #4   Title Pt will ambulate 1,000 over unlevel, paved surfaces with mod I using LRAD to indicate increased stability with community mobility. Target date: 03/08/15   PT  LONG TERM GOAL #5   Title Pt will negotiate standard ramp and curb step with mod I using LRAD to indicate increased independence traversing community obstacles. Target date: 03/08/15   Additional Long Term Goals   Additional  Long Term Goals Yes   PT LONG TERM GOAL #6   Title Pt will perform all above aspects of functional mobility with no more than 2-point increase in within-session pain rating to indicate decreased impact of pain on functional activity tolerance. Target date: 03/07/14   PT LONG TERM GOAL #7   Title Pt will report performance of daily walking program to return to prior fitness regimen without fear of falling. Target date: 03/08/15               Plan - 02/02/15 1538    Clinical Impression Statement Today's session focued on gait safety on indoor and outdoor surfaces and on balance on complaint surfaces. Pt reported a mild increase in knee pain, 5/10 after session. Pt is making steady progress toward her goals.   Pt will benefit from skilled therapeutic intervention in order to improve on the following deficits Abnormal gait;Decreased activity tolerance;Decreased endurance;Decreased balance;Pain;Hypomobility;Impaired perceived functional ability;Decreased mobility;Decreased knowledge of use of DME;Other (comment)  pain monitored but not directlly addressed in PT   Rehab Potential Good   PT Frequency 2x / week   PT Duration 8 weeks   PT Treatment/Interventions ADLs/Self Care Home Management;DME Instruction;Gait training;Stair training;Functional mobility training;Therapeutic activities;Therapeutic exercise;Balance training;Orthotic Fit/Training;Patient/family education;Neuromuscular re-education;Manual techniques;Vestibular   PT Next Visit Plan check STGs next visit;Continue gait training on indoor without AD and outdoors with cane. Continue to work on balance and strengthening as well.   PT Home Exercise Plan Consider adding stretch for L quadriceps, hamstrings, and gastroc to  HEP.   Consulted and Agree with Plan of Care Patient;Other (Comment)   Family Member Consulted Interpreter        Problem List Patient Active Problem List   Diagnosis Date Noted  . Pruritic condition 01/18/2015  . Actinic keratoses 09/13/2014  . Gait disorder 09/04/2014  . Rash and nonspecific skin eruption 09/04/2014  . Polymyalgia rheumatica ([place]) 04/20/2011  . INSOMNIA, PERSISTENT 06/26/2007  . HYPERCHOLESTEROLEMIA 02/20/2007  . Depression with anxiety 02/20/2007  . Hypothyroidism due to acquired atrophy of thyroid 12/03/2006  . Osteoarthritis 12/03/2006  . OSTEOPENIA 12/03/2006  . Migraine variant 11/26/2006  . IRRITABLE BOWEL SYNDROME 11/26/2006    Willow Ora 02/03/2015, 4:23 PM  Willow Ora, PTA, [facility] [address], [place], [place] [address] [phone] 02/03/2015, 4:23 PM   Name: CONCETTINA LETH MRN: [id number] Date of Birth: February 25, 1927

## 2015-02-04 ENCOUNTER — Other Ambulatory Visit: Payer: Self-pay | Admitting: *Deleted

## 2015-02-04 MED ORDER — LISINOPRIL 10 MG PO TABS: ORAL_TABLET | ORAL | Status: DC

## 2015-02-05 ENCOUNTER — Ambulatory Visit: Payer: Medicare Other | Admitting: Physical Therapy

## 2015-02-05 ENCOUNTER — Encounter: Payer: Self-pay | Admitting: Physical Therapy

## 2015-02-05 DIAGNOSIS — R531 Weakness: Secondary | ICD-10-CM | POA: Diagnosis not present

## 2015-02-05 DIAGNOSIS — R269 Unspecified abnormalities of gait and mobility: Secondary | ICD-10-CM | POA: Diagnosis not present

## 2015-02-05 DIAGNOSIS — R2681 Unsteadiness on feet: Secondary | ICD-10-CM

## 2015-02-05 NOTE — Patient Instructions (Signed)
Feet Heel-Toe "Tandem"    At counter with arms as needed for balance:walk a straight line forward bringing one foot directly in front of the other and then backwards by bringing one leg directly behind the other one.  Repeat for 3 laps each way. Do 1-2_ sessions per day.  Copyright  VHI. All rights reserved.  "I love a Database administrator    At counter for balance as needed: high knee marching forward and then backwards. Repeat 3 laps each way. Do __1-2__ sessions per day.  http://gt2.exer.us/345   Copyright  VHI. All rights reserved.

## 2015-02-05 NOTE — Therapy (Signed)
[facility] [address] [place], [place], [address] Phone: [phone]   Fax:  [phone]  Physical Therapy Treatment  Patient Details  Name: Anita Banks MRN: [id number] Date of Birth: 09-07-1927 Referring Provider: Lew Dawes, MD  Encounter Date: 02/05/2015      PT End of Session - 02/05/15 1406    Visit Number 8   Number of Visits 17   Date for PT Re-Evaluation 03/12/15   Authorization Type Medicare - GCodes and PN every 10 visits   PT Start Time 1402   PT Stop Time 1442   PT Time Calculation (min) 40 min   Equipment Utilized During Treatment Gait belt   Activity Tolerance Patient tolerated treatment well;No increased pain   Behavior During Therapy [facility] for tasks assessed/performed      Past Medical History  Diagnosis Date  . Hypertension   . Anxiety   . Migraine   . Arthritis     Past Surgical History  Procedure Laterality Date  . Abdominal hysterectomy    . Hemhorroid  2003    There were no vitals filed for this visit.  Visit Diagnosis:  Abnormality of gait  Unsteadiness  Weakness generalized      Subjective Assessment - 02/05/15 1404    Subjective No new complaints. No falls. Still with left knee pain, has brace on today. No issues with HEP.   Patient is accompained by: Interpreter  Sergy   Patient Stated Goals "I want to be more stable and safe and be able to walk without risk to fall. I'd like to be more independent and walk by myself."   Currently in Pain? Yes   Pain Score 3    Pain Location Knee   Pain Orientation Left   Pain Descriptors / Indicators Aching;Sore   Pain Type Chronic pain   Pain Onset More than a month ago   Pain Frequency Constant   Aggravating Factors  increased activity   Pain Relieving Factors rest, brace, and stretching           [facility] Adult PT Treatment/Exercise - 02/05/15 0001    Ambulation/Gait   Ambulation/Gait Yes    Ambulation/Gait Assistance 6: Modified independent (Device/Increase time)   Ambulation/Gait Assistance Details occasional cues on posture and for increased step length   Ambulation Distance (Feet) 430 Feet   Assistive device None   Gait Pattern Step-through pattern;Decreased stance time - left;Decreased stride length;Decreased weight shift to left;Left flexed knee in stance;Decreased trunk rotation   Ambulation Surface Level;Indoor   Gait velocity 13.46 sec's= 2.44 ft/sec   Stairs Assistance 6: Modified independent (Device/Increase time)   Stair Management Technique Step to pattern;One rail Right;Forwards   Number of Stairs 4   Berg Balance Test   Sit to Stand Able to stand without using hands and stabilize independently   Standing Unsupported Able to stand safely 2 minutes   Sitting with Back Unsupported but Feet Supported on Floor or Stool Able to sit safely and securely 2 minutes   Stand to Sit Sits safely with minimal use of hands   Transfers Able to transfer safely, minor use of hands   Standing Unsupported with Eyes Closed Able to stand 10 seconds safely   Standing Ubsupported with Feet Together Able to place feet together independently and stand 1 minute safely   From Standing, Reach Forward with Outstretched Arm Can reach forward >12 cm safely (5")  8 inches   From Standing Position, Pick up Object from  Floor Able to pick up shoe safely and easily   From Standing Position, Turn to Look Behind Over each Shoulder Looks behind one side only/other side shows less weight shift  right > left   Turn 360 Degrees Able to turn 360 degrees safely one side only in 4 seconds or less  to right < 4 seconds   Standing Unsupported, Alternately Place Feet on Step/Stool Able to stand independently and safely and complete 8 steps in 20 seconds  14.56 sec's   Standing Unsupported, One Foot in Front Able to plae foot ahead of the other independently and hold 30 seconds   Standing on One Leg Able to  lift leg independently and hold equal to or more than 3 seconds  right only   Total Score 50     At counter: Pt performed current balance HEP with minimal cues needed on correct form, posture and ex technique. Advanced 2 of them (see pt education/instructions for full details) due to no challenging pt to perform in clinic today.       PT Education - 02/05/15 1438    Education provided Yes   Education Details HEP: advanced tandem gait to fwd and bwd, also advanced marching to walking marches fwd and bwd (vs in place).   Person(s) Educated Patient;Other (comment)  interpreter   Methods Explanation;Demonstration;Handout   Comprehension Verbalized understanding;Returned demonstration          PT Short Term Goals - 02/05/15 1406    PT SHORT TERM GOAL #1   Title Pt will perform initial HEP with mod I using paper handout to maximize funcitonal gains made in PT. Target date: 02/08/15   Baseline 02/05/15   Status Achieved   PT SHORT TERM GOAL #2   Title Perform 6MWT to assess functional endurance. Target date: 02/08/15   Baseline Per documentation on 12/8: 6MWT distance = 996'   Status Achieved   PT SHORT TERM GOAL #3   Title Pt will improve Berg score from 43/56 to 47/56 to indicate decreased risk of falling. Target date: 02/08/15   Baseline 02/05/15: scored 50/56 today   Status Achieved   PT SHORT TERM GOAL #4   Title Pt will negotiate 4 stairs with single rail and mod I to increase safety with community mobility. Target date: 02/08/15   Baseline on 02/05/15   Status Achieved   PT SHORT TERM GOAL #5   Title Pt will report initiation of walking program > / = 3 times per week to increase ambulation tolerance. Target date: 02/08/15   Baseline Met 12/23.   Status Achieved   PT SHORT TERM GOAL #6   Title Pt will improve gait velocity to > 2.62 ft/sec to indicate status of community ambulator. Target date: 02/08/15   Baseline 02/05/15: 2.44 ft/sec with no AD today   Status Not Met            PT Long Term Goals - 01/11/15 1838    PT LONG TERM GOAL #1   Title Pt will improve Berg score from 43/56 to 51/56 to indicate significant improvement in functional standing balance. Target date: 03/08/15   PT LONG TERM GOAL #2   Title Pt will improve ABC score from 29.4% to >43% to indicate significant improvement in balance confidence. Target date: 03/08/15   PT LONG TERM GOAL #3   Title Pt will improve 6MWT distance by 201' from baseline to indicate significant improvement in functional endurance. Target date: 03/08/15   PT LONG TERM GOAL #  4   Title Pt will ambulate 1,000 over unlevel, paved surfaces with mod I using LRAD to indicate increased stability with community mobility. Target date: 03/08/15   PT LONG TERM GOAL #5   Title Pt will negotiate standard ramp and curb step with mod I using LRAD to indicate increased independence traversing community obstacles. Target date: 03/08/15   Additional Long Term Goals   Additional Long Term Goals Yes   PT LONG TERM GOAL #6   Title Pt will perform all above aspects of functional mobility with no more than 2-point increase in within-session pain rating to indicate decreased impact of pain on functional activity tolerance. Target date: 03/07/14   PT LONG TERM GOAL #7   Title Pt will report performance of daily walking program to return to prior fitness regimen without fear of falling. Target date: 03/08/15           Plan - 02/05/15 1406    Clinical Impression Statement Pt has met 5/6 STG's and is making progress toward her LTGs. Advanced pt's HEP today and will be emiailing a comrehensive list of them to pts daughter per their request as the pt keeps loosing the paper copies.   Pt will benefit from skilled therapeutic intervention in order to improve on the following deficits Abnormal gait;Decreased activity tolerance;Decreased endurance;Decreased balance;Pain;Hypomobility;Impaired perceived functional ability;Decreased mobility;Decreased knowledge  of use of DME;Other (comment)  pain monitored but not directlly addressed in PT   Rehab Potential Good   PT Frequency 2x / week   PT Duration 8 weeks   PT Treatment/Interventions ADLs/Self Care Home Management;DME Instruction;Gait training;Stair training;Functional mobility training;Therapeutic activities;Therapeutic exercise;Balance training;Orthotic Fit/Training;Patient/family education;Neuromuscular re-education;Manual techniques;Vestibular   PT Next Visit Plan ;Continue gait training on indoor without AD and outdoors with cane. Continue to work on balance and strengthening as well.   PT Home Exercise Plan Consider adding stretch for L quadriceps, hamstrings, and gastroc to HEP.   Consulted and Agree with Plan of Care Patient;Other (Comment)   Family Member Consulted Interpreter        Problem List Patient Active Problem List   Diagnosis Date Noted  . Pruritic condition 01/18/2015  . Actinic keratoses 09/13/2014  . Gait disorder 09/04/2014  . Rash and nonspecific skin eruption 09/04/2014  . Polymyalgia rheumatica ([place]) 04/20/2011  . INSOMNIA, PERSISTENT 06/26/2007  . HYPERCHOLESTEROLEMIA 02/20/2007  . Depression with anxiety 02/20/2007  . Hypothyroidism due to acquired atrophy of thyroid 12/03/2006  . Osteoarthritis 12/03/2006  . OSTEOPENIA 12/03/2006  . Migraine variant 11/26/2006  . IRRITABLE BOWEL SYNDROME 11/26/2006    Willow Ora 02/05/2015, 2:50 PM  Willow Ora, PTA, [facility] [address] [place], [place] [address] [phone] 02/05/2015, 2:50 PM   Name: PAOLA ALESHIRE MRN: [id number] Date of Birth: Jan 17, 1928

## 2015-02-09 ENCOUNTER — Encounter: Payer: Self-pay | Admitting: Physical Therapy

## 2015-02-09 ENCOUNTER — Ambulatory Visit: Payer: Medicare Other | Attending: Internal Medicine | Admitting: Physical Therapy

## 2015-02-09 DIAGNOSIS — R269 Unspecified abnormalities of gait and mobility: Secondary | ICD-10-CM | POA: Insufficient documentation

## 2015-02-09 DIAGNOSIS — R2681 Unsteadiness on feet: Secondary | ICD-10-CM | POA: Diagnosis not present

## 2015-02-09 DIAGNOSIS — R531 Weakness: Secondary | ICD-10-CM

## 2015-02-10 DIAGNOSIS — M1712 Unilateral primary osteoarthritis, left knee: Secondary | ICD-10-CM | POA: Diagnosis not present

## 2015-02-10 DIAGNOSIS — M25562 Pain in left knee: Secondary | ICD-10-CM | POA: Diagnosis not present

## 2015-02-10 NOTE — Therapy (Signed)
[facility] [address] [place], [place], [address] Phone: [phone]   Fax:  [phone]  Physical Therapy Treatment  Patient Details  Name: Anita Banks MRN: [id number] Date of Birth: 02-Mar-1927 Referring Provider: Lew Dawes, MD  Encounter Date: 02/09/2015      PT End of Session - 02/09/15 1454    Visit Number 9   Number of Visits 17   Date for PT Re-Evaluation 03/12/15   Authorization Type Medicare - GCodes and PN every 10 visits   PT Start Time 4174   PT Stop Time 1530   PT Time Calculation (min) 41 min   Equipment Utilized During Treatment Gait belt   Activity Tolerance Patient tolerated treatment well;No increased pain   Behavior During Therapy [facility] for tasks assessed/performed      Past Medical History  Diagnosis Date  . Hypertension   . Anxiety   . Migraine   . Arthritis     Past Surgical History  Procedure Laterality Date  . Abdominal hysterectomy    . Hemhorroid  2003    There were no vitals filed for this visit.  Visit Diagnosis:  Abnormality of gait  Unsteadiness  Weakness generalized      Subjective Assessment - 02/09/15 1453    Subjective No new complaints. No falls. Still with left knee pain, has brace on today. No issues with HEP.   Patient is accompained by: Interpreter   Pertinent History migraine headache, polymyalgia rheumatica, L knee OA with Baker's cyst, depression, anxiety   Patient Stated Goals "I want to be more stable and safe and be able to walk without risk to fall. I'd like to be more independent and walk by myself."   Currently in Pain? Yes   Pain Score 3    Pain Orientation Left   Pain Descriptors / Indicators Aching;Sore   Pain Type Chronic pain   Pain Onset More than a month ago   Aggravating Factors  increased activity   Pain Relieving Factors rest, brace, stretching            [facility] Adult PT Treatment/Exercise - 02/09/15 1455    Ambulation/Gait   Ambulation/Gait Yes   Ambulation/Gait Assistance 5: Supervision   Ambulation/Gait Assistance Details mostly supervision to mod I . supervision with floor surface transitions and furniture/obstacle negotiaion. mod I with staight pathways on level floors. one episode of min assist due to left toe catching with swing phase and resultant loss of balance.                     Ambulation Distance (Feet) 630 Feet   Assistive device None   Gait Pattern Step-through pattern;Decreased stance time - left;Decreased stride length;Decreased weight shift to left;Left flexed knee in stance;Decreased trunk rotation   Ambulation Surface Level;Indoor   High Level Balance   High Level Balance Activities Marching forwards;Marching backwards;Tandem walking  tandem fwd/bwd, toe/heel walk fwd/bwd   High Level Balance Comments red mats next to counter top for balance as needed: 3 laps each with min guard to min assist for balance, intermittent UE support on counter top and cues on posture and weight shifting with activity.                                Balance Exercises - 02/09/15 1519    Balance Exercises: Standing   Rockerboard Anterior/posterior;Head turns;Lateral;EO;EC;10 reps;30 seconds;Intermittent UE support  Balance Exercises: Standing   Standing Eyes Opened Limitations on rockerboard: head turns/nods x 10 reps each with eyes open both ways on board   Standing Eyes Closed Limitations on rocker board: static hold with no head turns with eyes closed, 13 sec's x 3 reps each way on board             PT Short Term Goals - 02/05/15 1406    PT SHORT TERM GOAL #1   Title Pt will perform initial HEP with mod I using paper handout to maximize funcitonal gains made in PT. Target date: 02/08/15   Baseline 02/05/15   Status Achieved   PT SHORT TERM GOAL #2   Title Perform 6MWT to assess functional endurance. Target date: 02/08/15   Baseline Per documentation on 12/8: 6MWT distance = 996'    Status Achieved   PT SHORT TERM GOAL #3   Title Pt will improve Berg score from 43/56 to 47/56 to indicate decreased risk of falling. Target date: 02/08/15   Baseline 02/05/15: scored 50/56 today   Status Achieved   PT SHORT TERM GOAL #4   Title Pt will negotiate 4 stairs with single rail and mod I to increase safety with community mobility. Target date: 02/08/15   Baseline on 02/05/15   Status Achieved   PT SHORT TERM GOAL #5   Title Pt will report initiation of walking program > / = 3 times per week to increase ambulation tolerance. Target date: 02/08/15   Baseline Met 12/23.   Status Achieved   PT SHORT TERM GOAL #6   Title Pt will improve gait velocity to > 2.62 ft/sec to indicate status of community ambulator. Target date: 02/08/15   Baseline 02/05/15: 2.44 ft/sec with no AD today   Status Not Met           PT Long Term Goals - 01/11/15 1838    PT LONG TERM GOAL #1   Title Pt will improve Berg score from 43/56 to 51/56 to indicate significant improvement in functional standing balance. Target date: 03/08/15   PT LONG TERM GOAL #2   Title Pt will improve ABC score from 29.4% to >43% to indicate significant improvement in balance confidence. Target date: 03/08/15   PT LONG TERM GOAL #3   Title Pt will improve 6MWT distance by 201' from baseline to indicate significant improvement in functional endurance. Target date: 03/08/15   PT LONG TERM GOAL #4   Title Pt will ambulate 1,000 over unlevel, paved surfaces with mod I using LRAD to indicate increased stability with community mobility. Target date: 03/08/15   PT LONG TERM GOAL #5   Title Pt will negotiate standard ramp and curb step with mod I using LRAD to indicate increased independence traversing community obstacles. Target date: 03/08/15   Additional Long Term Goals   Additional Long Term Goals Yes   PT LONG TERM GOAL #6   Title Pt will perform all above aspects of functional mobility with no more than 2-point increase in within-session  pain rating to indicate decreased impact of pain on functional activity tolerance. Target date: 03/07/14   PT LONG TERM GOAL #7   Title Pt will report performance of daily walking program to return to prior fitness regimen without fear of falling. Target date: 03/08/15           Plan - 02/09/15 1454    Clinical Impression Statement Skilled session continued to focus on gait without AD on indoor surfaces for increased distances  without pt's pain increasing and on balance activities. Pt does continue to report increased knee pain with longer gait distances and with balance activities that resolves quickly with seated rest breaks. Pt is making steady progress toward goals.                                    Pt will benefit from skilled therapeutic intervention in order to improve on the following deficits Abnormal gait;Decreased activity tolerance;Decreased endurance;Decreased balance;Pain;Hypomobility;Impaired perceived functional ability;Decreased mobility;Decreased knowledge of use of DME;Other (comment)  pain monitored but not directlly addressed in PT   Rehab Potential Good   PT Frequency 2x / week   PT Duration 8 weeks   PT Treatment/Interventions ADLs/Self Care Home Management;DME Instruction;Gait training;Stair training;Functional mobility training;Therapeutic activities;Therapeutic exercise;Balance training;Orthotic Fit/Training;Patient/family education;Neuromuscular re-education;Manual techniques;Vestibular   PT Next Visit Plan G-code next visit;Continue gait training on indoor without AD and outdoors with cane. Continue to work on balance and strengthening as well.   PT Home Exercise Plan Consider adding stretch for L quadriceps, hamstrings, and gastroc to HEP.   Consulted and Agree with Plan of Care Patient;Other (Comment)   Family Member Consulted Interpreter        Problem List Patient Active Problem List   Diagnosis Date Noted  . Pruritic condition 01/18/2015  . Actinic  keratoses 09/13/2014  . Gait disorder 09/04/2014  . Rash and nonspecific skin eruption 09/04/2014  . Polymyalgia rheumatica ([place]) 04/20/2011  . INSOMNIA, PERSISTENT 06/26/2007  . HYPERCHOLESTEROLEMIA 02/20/2007  . Depression with anxiety 02/20/2007  . Hypothyroidism due to acquired atrophy of thyroid 12/03/2006  . Osteoarthritis 12/03/2006  . OSTEOPENIA 12/03/2006  . Migraine variant 11/26/2006  . IRRITABLE BOWEL SYNDROME 11/26/2006    Willow Ora 02/10/2015, 12:08 PM  Willow Ora, PTA, [facility] [address], [place], [place] [address] [phone] 02/10/2015, 12:08 PM   Name: CHIRSTINE DEFRAIN MRN: [id number] Date of Birth: Jul 03, 1927

## 2015-02-12 ENCOUNTER — Other Ambulatory Visit: Payer: Self-pay | Admitting: Family Medicine

## 2015-02-12 ENCOUNTER — Encounter: Payer: Self-pay | Admitting: Physical Therapy

## 2015-02-12 ENCOUNTER — Ambulatory Visit: Payer: Medicare Other | Admitting: Physical Therapy

## 2015-02-12 DIAGNOSIS — R531 Weakness: Secondary | ICD-10-CM | POA: Diagnosis not present

## 2015-02-12 DIAGNOSIS — R2681 Unsteadiness on feet: Secondary | ICD-10-CM | POA: Diagnosis not present

## 2015-02-12 DIAGNOSIS — R269 Unspecified abnormalities of gait and mobility: Secondary | ICD-10-CM | POA: Diagnosis not present

## 2015-02-12 NOTE — Therapy (Addendum)
[facility] [address] [place], [place], [address] Phone: [phone]   Fax:  [phone]  Physical Therapy Treatment  Patient Details  Name: Anita Banks MRN: [id number] Date of Birth: Jun 18, 1927 Referring Provider: Lew Dawes, MD  Encounter Date: 02/12/2015      PT End of Session - 02/12/15 1536    Visit Number 10   Number of Visits 17   Date for PT Re-Evaluation 03/12/15   Authorization Type Medicare - GCodes and PN every 10 visits   PT Start Time 2035   Equipment Utilized During Treatment Gait belt   Activity Tolerance Patient tolerated treatment well;No increased pain   Behavior During Therapy [facility] for tasks assessed/performed      Past Medical History  Diagnosis Date  . Hypertension   . Anxiety   . Migraine   . Arthritis     Past Surgical History  Procedure Laterality Date  . Abdominal hysterectomy    . Hemhorroid  2003    There were no vitals filed for this visit.  Visit Diagnosis:  No diagnosis found.      Subjective Assessment - 02/12/15 1535    Subjective No new complaints. No falls. Still with left knee pain, has brace on today. No issues with HEP.   Patient is accompained by: Interpreter  Sergy   Pertinent History migraine headache, polymyalgia rheumatica, L knee OA with Baker's cyst, depression, anxiety   Patient Stated Goals "I want to be more stable and safe and be able to walk without risk to fall. I'd like to be more independent and walk by myself."   Currently in Pain? Yes   Pain Score 3    Pain Location Knee   Pain Orientation Left   Pain Descriptors / Indicators Aching;Sore   Pain Type Chronic pain   Pain Onset More than a month ago   Pain Frequency Constant   Aggravating Factors  increased actvitiy   Pain Relieving Factors rest, brace, stretching        02/12/15 0001  High Level Balance  High Level Balance Activities Marching forwards;Marching  backwards;Tandem walking  High Level Balance Comments on red mats x 3 laps each/each way with min guard assist to min assist with cues on posture, ex form and weight shifting. cues to slow down for increased stability with activites as well.                                      Balance Exercises - 02/12/15 1552    Balance Exercises: Standing   SLS with Vectors Foam/compliant surface;Upper extremity assist 1;Other reps (comment);Limitations;Other (comment)   Rockerboard Anterior/posterior;Lateral;Head turns;EO;EC;Other time (comment);Other reps (comment);Intermittent UE support   Balance Exercises: Standing   Standing Eyes Opened Limitations on rockerboard: head turns/nods x 10 reps each with eyes open both ways on board   Standing Eyes Closed Limitations on rocker board: static hold with no head turns with eyes closed, 15 sec's x 3 reps each way on board   SLS with Vectors Limitations on red mats with cones along edges: alternating toe taps to each with side stepping x 2 laps each way, alternating double toe taps to each with side stepping x 1 lap each way, and alternating flipping over/up with side stepping x 1 lap each way. min HHA with each, with cues on posture, ex technique and on weight shifting with activity. with  cones in single line down middle of mats: atlernating toe taps to each with weaving aorund cones to advance to next one x 6 laps forward with min guard to min assist for balance.                                                             PT Short Term Goals - 02/05/15 1406    PT SHORT TERM GOAL #1   Title Pt will perform initial HEP with mod I using paper handout to maximize funcitonal gains made in PT. Target date: 02/08/15   Baseline 02/05/15   Status Achieved   PT SHORT TERM GOAL #2   Title Perform 6MWT to assess functional endurance. Target date: 02/08/15   Baseline Per documentation on 12/8: 6MWT distance = 996'   Status Achieved   PT SHORT TERM GOAL #3   Title Pt  will improve Berg score from 43/56 to 47/56 to indicate decreased risk of falling. Target date: 02/08/15   Baseline 02/05/15: scored 50/56 today   Status Achieved   PT SHORT TERM GOAL #4   Title Pt will negotiate 4 stairs with single rail and mod I to increase safety with community mobility. Target date: 02/08/15   Baseline on 02/05/15   Status Achieved   PT SHORT TERM GOAL #5   Title Pt will report initiation of walking program > / = 3 times per week to increase ambulation tolerance. Target date: 02/08/15   Baseline Met 12/23.   Status Achieved   PT SHORT TERM GOAL #6   Title Pt will improve gait velocity to > 2.62 ft/sec to indicate status of community ambulator. Target date: 02/08/15   Baseline 02/05/15: 2.44 ft/sec with no AD today   Status Not Met           PT Long Term Goals - 01/11/15 1838    PT LONG TERM GOAL #1   Title Pt will improve Berg score from 43/56 to 51/56 to indicate significant improvement in functional standing balance. Target date: 03/08/15   PT LONG TERM GOAL #2   Title Pt will improve ABC score from 29.4% to >43% to indicate significant improvement in balance confidence. Target date: 03/08/15   PT LONG TERM GOAL #3   Title Pt will improve 6MWT distance by 201' from baseline to indicate significant improvement in functional endurance. Target date: 03/08/15   PT LONG TERM GOAL #4   Title Pt will ambulate 1,000 over unlevel, paved surfaces with mod I using LRAD to indicate increased stability with community mobility. Target date: 03/08/15   PT LONG TERM GOAL #5   Title Pt will negotiate standard ramp and curb step with mod I using LRAD to indicate increased independence traversing community obstacles. Target date: 03/08/15   Additional Long Term Goals   Additional Long Term Goals Yes   PT LONG TERM GOAL #6   Title Pt will perform all above aspects of functional mobility with no more than 2-point increase in within-session pain rating to indicate decreased impact of pain on  functional activity tolerance. Target date: 03/07/14   PT LONG TERM GOAL #7   Title Pt will report performance of daily walking program to return to prior fitness regimen without fear of falling. Target date: 03/08/15  Plan - Feb 18, 2015 1536    Clinical Impression Statement Skilled session focues on balance today without any issues reported. Pt making steady progress toward goals.   Pt will benefit from skilled therapeutic intervention in order to improve on the following deficits Abnormal gait;Decreased activity tolerance;Decreased endurance;Decreased balance;Pain;Hypomobility;Impaired perceived functional ability;Decreased mobility;Decreased knowledge of use of DME;Other (comment)  pain monitored but not directlly addressed in PT   Rehab Potential Good   PT Frequency 2x / week   PT Duration 8 weeks   PT Treatment/Interventions ADLs/Self Care Home Management;DME Instruction;Gait training;Stair training;Functional mobility training;Therapeutic activities;Therapeutic exercise;Balance training;Orthotic Fit/Training;Patient/family education;Neuromuscular re-education;Manual techniques;Vestibular   PT Next Visit Plan Continue gait training on indoor without AD and outdoors with cane. Continue to work on balance and strengthening as well.   PT Home Exercise Plan Consider adding stretch for L quadriceps, hamstrings, and gastroc to HEP.   Consulted and Agree with Plan of Care Patient;Other (Comment)   Family Member Consulted Interpreter            G-Codes - 2015/02/18 1618    Functional Assessment Tool Used Merrilee Jansky 50/56   Functional Limitation Mobility: Walking and moving around   Mobility: Walking and Moving Around Current Status [phone]) At least 1 percent but less than 20 percent impaired, limited or restricted   Mobility: Walking and Moving Around Goal Status ([id number]) At least 1 percent but less than 20 percent impaired, limited or restricted    Cameron Sprang, PT, MPT [facility] [address] [place], [place], [address] Phone: [phone]   Fax:  [phone] 2015-02-18, 4:22 PM   Problem List Patient Active Problem List   Diagnosis Date Noted  . Pruritic condition 01/18/2015  . Actinic keratoses 09/13/2014  . Gait disorder 09/04/2014  . Rash and nonspecific skin eruption 09/04/2014  . Polymyalgia rheumatica ([place]) 04/20/2011  . INSOMNIA, PERSISTENT 06/26/2007  . HYPERCHOLESTEROLEMIA 02/20/2007  . Depression with anxiety 02/20/2007  . Hypothyroidism due to acquired atrophy of thyroid 12/03/2006  . Osteoarthritis 12/03/2006  . OSTEOPENIA 12/03/2006  . Migraine variant 11/26/2006  . IRRITABLE BOWEL SYNDROME 11/26/2006    Willow Ora 02-18-2015, 4:21 PM  Willow Ora, PTA, [facility] [address] [place], [place] [address] [phone] 02/13/2015, 7:37 PM   Name: Anita Banks MRN: [id number] Date of Birth: 08/20/1927   Addendum by Billie Ruddy, PT, DPT  Physical Therapy Progress Note  Dates of Reporting Period: 01/11/15 to Feb 18, 2015  Objective Reports of Subjective Statement: Pt reports subjective improvement in balance confidence and endurance. Pt has returned to walking program daily.  Objective Measurements: Berg 50/56  Goal Update: See above.  Plan: Continue per POC.  Reason Skilled Services are Required: To maximize stability/independence with functional mobility; continue to improve high level standing balance, balance confidence, stability with dynamic gait, and to decrease fall risk.  Billie Ruddy, PT, DPT [facility] [address] [place], [place], [address] Phone: [phone]   Fax:  [phone] 02/16/2015, 9:42 AM

## 2015-02-13 NOTE — Telephone Encounter (Signed)
Okay to refill lorazepam x 1 month.  Further refills need to be completed by new PCP at [facility].

## 2015-02-15 ENCOUNTER — Ambulatory Visit: Payer: Medicare Other | Admitting: Physical Therapy

## 2015-02-16 DIAGNOSIS — M1712 Unilateral primary osteoarthritis, left knee: Secondary | ICD-10-CM | POA: Diagnosis not present

## 2015-02-16 DIAGNOSIS — M25562 Pain in left knee: Secondary | ICD-10-CM | POA: Diagnosis not present

## 2015-02-17 ENCOUNTER — Ambulatory Visit: Payer: Medicare Other | Admitting: Physical Therapy

## 2015-02-17 DIAGNOSIS — R531 Weakness: Secondary | ICD-10-CM | POA: Diagnosis not present

## 2015-02-17 DIAGNOSIS — R269 Unspecified abnormalities of gait and mobility: Secondary | ICD-10-CM

## 2015-02-17 DIAGNOSIS — R2681 Unsteadiness on feet: Secondary | ICD-10-CM

## 2015-02-17 NOTE — Therapy (Signed)
[facility] [address] [place], [place], [address] Phone: [phone]   Fax:  [phone]  Physical Therapy Treatment  Patient Details  Name: Anita Banks MRN: [id number] Date of Birth: 1927/06/15 Referring Provider: Lew Dawes, MD  Encounter Date: 02/17/2015      PT End of Session - 02/17/15 1640    Visit Number 11   Number of Visits 17   Date for PT Re-Evaluation 03/12/15   Authorization Type Medicare - GCodes and PN every 10 visits   PT Start Time 2992   PT Stop Time 1616   PT Time Calculation (min) 42 min   Equipment Utilized During Treatment Gait belt   Activity Tolerance Patient tolerated treatment well;No increased pain   Behavior During Therapy [facility] for tasks assessed/performed      Past Medical History  Diagnosis Date  . Hypertension   . Anxiety   . Migraine   . Arthritis     Past Surgical History  Procedure Laterality Date  . Abdominal hysterectomy    . Hemhorroid  2003    There were no vitals filed for this visit.  Visit Diagnosis:  Abnormality of gait  Unsteadiness      Subjective Assessment - 02/17/15 1535    Subjective Pt denies significant changes and reports no falls. Pt now able to walk 30-35 minutes without stopping.   Patient is accompained by: Interpreter   Pertinent History migraine headache, polymyalgia rheumatica, L knee OA with Baker's cyst, depression, anxiety   Patient Stated Goals "I want to be more stable and safe and be able to walk without risk to fall. I'd like to be more independent and walk by myself."   Pain Score 3    Pain Location Knee   Pain Orientation Left   Pain Descriptors / Indicators Aching;Sore   Pain Type Chronic pain   Pain Onset More than a month ago   Pain Frequency Constant   Aggravating Factors  increased activity   Pain Relieving Factors rest, brace, stretching   Multiple Pain Sites No                          [facility] Adult PT Treatment/Exercise - 02/17/15 0001    Ambulation/Gait   Ambulation/Gait Yes   Ambulation/Gait Assistance 5: Supervision;6: Modified independent (Device/Increase time)   Ambulation/Gait Assistance Details Gait x600' outdoors without AD with mod I for majority of gait trial, supervision for descending curb step and ascending incline. No overt LOB. Noted limited B hip extension throughout gait pattern.   Ambulation Distance (Feet) 600 Feet   Assistive device None   Gait Pattern Step-through pattern;Decreased stance time - left;Decreased stride length;Decreased weight shift to left;Left flexed knee in stance;Decreased trunk rotation  limited B hip extension   Ambulation Surface Unlevel;Outdoor;Paved   Ramp 5: Supervision   Curb 5: Supervision   Gait Comments Tactile cueing for upright posture, verbal cueing for B hip extension.   Exercises   Exercises Other Exercises   Other Exercises  B iliopsoas, rectus femoris stretch x2 minutes bilaterally. Added to HEP.             Balance Exercises - 02/17/15 1630    Balance Exercises: Standing   Standing Eyes Opened Wide (BOA);Foam/compliant surface;30 secs   Wall Bumps Hip   Wall Bumps-Hips 20 reps;Eyes opened;Anterior/posterior   Rockerboard Anterior/posterior;EO;EC;Other (comment);30 seconds;5 reps  baloon tap for balance perturbations   Balance Beam Standing on blue  foam beam, pt engaged in zoom ball activity for A/P balance perturbations.           PT Education - 02/17/15 1626    Education provided Yes   Education Details HEP: added wall bumps and hip flexor stretch. See pt instructions.   Person(s) Educated Patient;Other (comment)  interpreter   Methods Explanation;Demonstration;Verbal cues;Handout   Comprehension Verbalized understanding;Returned demonstration          PT Short Term Goals - 02/05/15 1406    PT SHORT TERM GOAL #1   Title Pt will perform initial HEP with mod I using paper handout to  maximize funcitonal gains made in PT. Target date: 02/08/15   Baseline 02/05/15   Status Achieved   PT SHORT TERM GOAL #2   Title Perform 6MWT to assess functional endurance. Target date: 02/08/15   Baseline Per documentation on 12/8: 6MWT distance = 996'   Status Achieved   PT SHORT TERM GOAL #3   Title Pt will improve Berg score from 43/56 to 47/56 to indicate decreased risk of falling. Target date: 02/08/15   Baseline 02/05/15: scored 50/56 today   Status Achieved   PT SHORT TERM GOAL #4   Title Pt will negotiate 4 stairs with single rail and mod I to increase safety with community mobility. Target date: 02/08/15   Baseline on 02/05/15   Status Achieved   PT SHORT TERM GOAL #5   Title Pt will report initiation of walking program > / = 3 times per week to increase ambulation tolerance. Target date: 02/08/15   Baseline Met 12/23.   Status Achieved   PT SHORT TERM GOAL #6   Title Pt will improve gait velocity to > 2.62 ft/sec to indicate status of community ambulator. Target date: 02/08/15   Baseline 02/05/15: 2.44 ft/sec with no AD today   Status Not Met           PT Long Term Goals - 01/11/15 1838    PT LONG TERM GOAL #1   Title Pt will improve Berg score from 43/56 to 51/56 to indicate significant improvement in functional standing balance. Target date: 03/08/15   PT LONG TERM GOAL #2   Title Pt will improve ABC score from 29.4% to >43% to indicate significant improvement in balance confidence. Target date: 03/08/15   PT LONG TERM GOAL #3   Title Pt will improve 6MWT distance by 201' from baseline to indicate significant improvement in functional endurance. Target date: 03/08/15   PT LONG TERM GOAL #4   Title Pt will ambulate 1,000 over unlevel, paved surfaces with mod I using LRAD to indicate increased stability with community mobility. Target date: 03/08/15   PT LONG TERM GOAL #5   Title Pt will negotiate standard ramp and curb step with mod I using LRAD to indicate increased independence  traversing community obstacles. Target date: 03/08/15   Additional Long Term Goals   Additional Long Term Goals Yes   PT LONG TERM GOAL #6   Title Pt will perform all above aspects of functional mobility with no more than 2-point increase in within-session pain rating to indicate decreased impact of pain on functional activity tolerance. Target date: 03/07/14   PT LONG TERM GOAL #7   Title Pt will report performance of daily walking program to return to prior fitness regimen without fear of falling. Target date: 03/08/15               Plan - 02/17/15 1642    Clinical  Impression Statement Session focused on facilitating effective hip strategy with balance perturbations, promoting more normalized posture, gait pattern. During community gait over paved surfaces, pt required intermittent supervision and exhibited no overt LOB.   Pt will benefit from skilled therapeutic intervention in order to improve on the following deficits Abnormal gait;Decreased activity tolerance;Decreased endurance;Decreased balance;Hypomobility;Impaired perceived functional ability;Decreased mobility;Decreased knowledge of use of DME;Pain   Rehab Potential Good   PT Frequency 2x / week   PT Duration 8 weeks   PT Treatment/Interventions ADLs/Self Care Home Management;DME Instruction;Gait training;Stair training;Functional mobility training;Therapeutic activities;Therapeutic exercise;Balance training;Orthotic Fit/Training;Patient/family education;Neuromuscular re-education;Manual techniques;Vestibular   PT Next Visit Plan Gait training, postural normalizatoin, hip strategy, high level balance/gait.   Consulted and Agree with Plan of Care Patient;Other (Comment)  interpreter   Family Member Consulted Interpreter        Problem List Patient Active Problem List   Diagnosis Date Noted  . Pruritic condition 01/18/2015  . Actinic keratoses 09/13/2014  . Gait disorder 09/04/2014  . Rash and nonspecific skin eruption  09/04/2014  . Polymyalgia rheumatica ([place]) 04/20/2011  . INSOMNIA, PERSISTENT 06/26/2007  . HYPERCHOLESTEROLEMIA 02/20/2007  . Depression with anxiety 02/20/2007  . Hypothyroidism due to acquired atrophy of thyroid 12/03/2006  . Osteoarthritis 12/03/2006  . OSTEOPENIA 12/03/2006  . Migraine variant 11/26/2006  . IRRITABLE BOWEL SYNDROME 11/26/2006    Billie Ruddy, PT, DPT [facility] [address] [place], [place], [address] Phone: [phone]   Fax:  [phone] 02/17/2015, 4:47 PM   Name: YARAH FUENTE MRN: [id number] Date of Birth: 01/04/1928

## 2015-02-17 NOTE — Patient Instructions (Addendum)
Hip Flexor Stretch    Lying on back near edge of bed, bend one leg, foot flat. Hang other leg over edge, relaxed, thigh hanging off of bed for 60 seconds. Perform this stretch 23 times per day on each leg.  http://gt2.exer.us/347   Copyright  VHI. All rights reserved.   Weight Shift: Anterior / Posterior (Righting / Equilibrium)    BEGIN WITH BACK LEANING AGAINST THE WALL AND FEET 4 INCHES AWAY. Move your hips off the wall and come to upright standing.  * Make sure you lead with your hips - not your head and shoulders.  Hold for 5 seconds. Return slowly to the wall letting your hips bump the wall and return to stand.   Hold each position __5__ seconds. Repeat _10__ times per session. Do __2_ sessions per day.

## 2015-02-17 NOTE — Telephone Encounter (Signed)
Rx called in to pharmacy. 

## 2015-02-22 ENCOUNTER — Ambulatory Visit: Payer: Medicare Other | Admitting: Physical Therapy

## 2015-02-22 DIAGNOSIS — R269 Unspecified abnormalities of gait and mobility: Secondary | ICD-10-CM | POA: Diagnosis not present

## 2015-02-22 DIAGNOSIS — R2681 Unsteadiness on feet: Secondary | ICD-10-CM | POA: Diagnosis not present

## 2015-02-22 DIAGNOSIS — R531 Weakness: Secondary | ICD-10-CM | POA: Diagnosis not present

## 2015-02-22 NOTE — Therapy (Signed)
[facility] [address] [place], [place], [address] Phone: [phone]   Fax:  [phone]  Physical Therapy Treatment  Patient Details  Name: Anita Banks MRN: [id number] Date of Birth: September 11, 1927 Referring Provider: Lew Dawes, MD  Encounter Date: 02/22/2015      PT End of Session - 02/22/15 1625    Visit Number 12   Number of Visits 17   Date for PT Re-Evaluation 03/12/15   Authorization Type Medicare - GCodes and PN every 10 visits   PT Start Time 2774   PT Stop Time 1615   PT Time Calculation (min) 42 min   Equipment Utilized During Treatment Gait belt   Activity Tolerance Patient tolerated treatment well;No increased pain   Behavior During Therapy [facility] for tasks assessed/performed      Past Medical History  Diagnosis Date  . Hypertension   . Anxiety   . Migraine   . Arthritis     Past Surgical History  Procedure Laterality Date  . Abdominal hysterectomy    . Hemhorroid  2003    There were no vitals filed for this visit.  Visit Diagnosis:  Abnormality of gait  Unsteadiness      Subjective Assessment - 02/22/15 1535    Subjective Pt reports no falls and no significant changes. When asked about perceived impairments, pt states, "I think my walking is a weakness; the reliability of my walking,"   Patient is accompained by: Interpreter   Pertinent History migraine headache, polymyalgia rheumatica, L knee OA with Baker's cyst, depression, anxiety   Patient Stated Goals "I want to be more stable and safe and be able to walk without risk to fall. I'd like to be more independent and walk by myself."   Currently in Pain? Yes   Pain Score 3    Pain Location Knee   Pain Orientation Left   Pain Descriptors / Indicators Aching;Sore   Pain Type Chronic pain   Pain Onset More than a month ago   Pain Frequency Constant   Aggravating Factors  increased activity   Pain Relieving Factors  rest, brace, stretching   Multiple Pain Sites No                         [facility] Adult PT Treatment/Exercise - 02/22/15 0001    Ambulation/Gait   Ambulation/Gait Yes   Ambulation/Gait Assistance 6: Modified independent (Device/Increase time);5: Supervision  increased time   Ambulation/Gait Assistance Details Cueing for B heel strike.   Ambulation Distance (Feet) 1200 Feet  x600' over unlevel, paved surfaces   Assistive device None   Gait Pattern Step-through pattern;Decreased stride length;Left flexed knee in stance;Decreased trunk rotation;Left foot flat  limited B hip extension   Ambulation Surface Level;Unlevel;Indoor;Outdoor;Paved   Curb 4: Min assist;5: Supervision   Curb Details (indicate cue type and reason) Blocked practice indoors then outdoors without AD, initially with min guard due to decrease balance confidence, decreased left single limb stance stability.    Neuro Re-ed    Neuro Re-ed Details  Stepping over hurdles x14 of varying height without UE use requiring x2 trials leading with RLE, x2 trials leading with LLE with CGA to min A for stability/balance. With RLE then LLE, pt turned over cones then replaced cones in upright position 5 reps x2 with RLE then LLE with CGA, tactile cueing for lateral weight shift.From foot of stairs, performed toe taps on first (6") step x15 reps, on second  step (12" from base of stairs) x12 reps with CGA.                    PT Short Term Goals - 02/05/15 1406    PT SHORT TERM GOAL #1   Title Pt will perform initial HEP with mod I using paper handout to maximize funcitonal gains made in PT. Target date: 02/08/15   Baseline 02/05/15   Status Achieved   PT SHORT TERM GOAL #2   Title Perform 6MWT to assess functional endurance. Target date: 02/08/15   Baseline Per documentation on 12/8: 6MWT distance = 996'   Status Achieved   PT SHORT TERM GOAL #3   Title Pt will improve Berg score from 43/56 to 47/56 to indicate  decreased risk of falling. Target date: 02/08/15   Baseline 02/05/15: scored 50/56 today   Status Achieved   PT SHORT TERM GOAL #4   Title Pt will negotiate 4 stairs with single rail and mod I to increase safety with community mobility. Target date: 02/08/15   Baseline on 02/05/15   Status Achieved   PT SHORT TERM GOAL #5   Title Pt will report initiation of walking program > / = 3 times per week to increase ambulation tolerance. Target date: 02/08/15   Baseline Met 12/23.   Status Achieved   PT SHORT TERM GOAL #6   Title Pt will improve gait velocity to > 2.62 ft/sec to indicate status of community ambulator. Target date: 02/08/15   Baseline 02/05/15: 2.44 ft/sec with no AD today   Status Not Met           PT Long Term Goals - 01/11/15 1838    PT LONG TERM GOAL #1   Title Pt will improve Berg score from 43/56 to 51/56 to indicate significant improvement in functional standing balance. Target date: 03/08/15   PT LONG TERM GOAL #2   Title Pt will improve ABC score from 29.4% to >43% to indicate significant improvement in balance confidence. Target date: 03/08/15   PT LONG TERM GOAL #3   Title Pt will improve 6MWT distance by 201' from baseline to indicate significant improvement in functional endurance. Target date: 03/08/15   PT LONG TERM GOAL #4   Title Pt will ambulate 1,000 over unlevel, paved surfaces with mod I using LRAD to indicate increased stability with community mobility. Target date: 03/08/15   PT LONG TERM GOAL #5   Title Pt will negotiate standard ramp and curb step with mod I using LRAD to indicate increased independence traversing community obstacles. Target date: 03/08/15   Additional Long Term Goals   Additional Long Term Goals Yes   PT LONG TERM GOAL #6   Title Pt will perform all above aspects of functional mobility with no more than 2-point increase in within-session pain rating to indicate decreased impact of pain on functional activity tolerance. Target date: 03/07/14    PT LONG TERM GOAL #7   Title Pt will report performance of daily walking program to return to prior fitness regimen without fear of falling. Target date: 03/08/15               Plan - 02/22/15 1626    Clinical Impression Statement Skilled session focused on increasing dynamic single limb stance stability, balance confidence, and stability/independence with curb step negotiation. Pt tolerated interventions without increased pain but did require frequnt seated rest breaks due to fatigue.   Pt will benefit from skilled therapeutic intervention in order  to improve on the following deficits Abnormal gait;Decreased activity tolerance;Decreased endurance;Decreased balance;Hypomobility;Impaired perceived functional ability;Decreased mobility;Decreased knowledge of use of DME;Pain   Rehab Potential Good   PT Frequency 2x / week   PT Duration 8 weeks   PT Treatment/Interventions ADLs/Self Care Home Management;DME Instruction;Gait training;Stair training;Functional mobility training;Therapeutic activities;Therapeutic exercise;Balance training;Orthotic Fit/Training;Patient/family education;Neuromuscular re-education;Manual techniques;Vestibular   PT Next Visit Plan single limb stance stability; standing balance; gait over unlevel surfaces; dynamic gait   Consulted and Agree with Plan of Care Patient;Other (Comment)   Family Member Consulted Interpreter        Problem List Patient Active Problem List   Diagnosis Date Noted  . Pruritic condition 01/18/2015  . Actinic keratoses 09/13/2014  . Gait disorder 09/04/2014  . Rash and nonspecific skin eruption 09/04/2014  . Polymyalgia rheumatica ([place]) 04/20/2011  . INSOMNIA, PERSISTENT 06/26/2007  . HYPERCHOLESTEROLEMIA 02/20/2007  . Depression with anxiety 02/20/2007  . Hypothyroidism due to acquired atrophy of thyroid 12/03/2006  . Osteoarthritis 12/03/2006  . OSTEOPENIA 12/03/2006  . Migraine variant 11/26/2006  . IRRITABLE BOWEL SYNDROME  11/26/2006    Billie Ruddy, PT, DPT [facility] [address] [place], [place], [address] Phone: [phone]   Fax:  [phone] 02/22/2015, 4:30 PM  Name: Anita Banks MRN: [id number] Date of Birth: August 01, 1927

## 2015-02-24 ENCOUNTER — Ambulatory Visit: Payer: Medicare Other | Admitting: Physical Therapy

## 2015-02-24 DIAGNOSIS — R269 Unspecified abnormalities of gait and mobility: Secondary | ICD-10-CM

## 2015-02-24 DIAGNOSIS — R2681 Unsteadiness on feet: Secondary | ICD-10-CM

## 2015-02-24 DIAGNOSIS — R531 Weakness: Secondary | ICD-10-CM | POA: Diagnosis not present

## 2015-02-24 NOTE — Therapy (Addendum)
[place] [address] [place], [place], [address] Phone: [phone]   Fax:  [phone]  Physical Therapy Treatment  Patient Details  Name: Anita Banks MRN: [id number] Date of Birth: March 13, 1927 Referring Provider: Lew Dawes, MD  Encounter Date: 02/24/2015      PT End of Session - 02/24/15 2004    Visit Number 13   Number of Visits 17   Date for PT Re-Evaluation 03/12/15   Authorization Type Medicare - GCodes and PN every 10 visits   PT Start Time 2694   PT Stop Time 1625   PT Time Calculation (min) 47 min   Equipment Utilized During Treatment Gait belt   Activity Tolerance Patient tolerated treatment well;No increased pain   Behavior During Therapy [facility] for tasks assessed/performed      Past Medical History  Diagnosis Date  . Hypertension   . Anxiety   . Migraine   . Arthritis     Past Surgical History  Procedure Laterality Date  . Abdominal hysterectomy    . Hemhorroid  2003    There were no vitals filed for this visit.  Visit Diagnosis:  Abnormality of gait  Unsteadiness  Weakness generalized      Subjective Assessment - 02/24/15 1540    Subjective Pt denies falls and significant changes. Reports ongoing pain in knee.   Patient is accompained by: Interpreter   Pertinent History migraine headache, polymyalgia rheumatica, L knee OA with Baker's cyst, depression, anxiety   Patient Stated Goals "I want to be more stable and safe and be able to walk without risk to fall. I'd like to be more independent and walk by myself."   Currently in Pain? Yes   Pain Score 3    Pain Location Knee   Pain Orientation Left   Pain Descriptors / Indicators Aching;Sore   Pain Type Chronic pain   Pain Onset More than a month ago   Pain Frequency Constant   Aggravating Factors  increased activity; walking   Pain Relieving Factors rest, brace, stretcing                          [facility] Adult PT Treatment/Exercise - 02/24/15 0001    Ambulation/Gait   Ambulation/Gait Yes   Ambulation/Gait Assistance 6: Modified independent (Device/Increase time);5: Supervision  increased time   Ambulation/Gait Assistance Details Cueing for upright posture.   Ambulation Distance (Feet) 450 Feet  x600' over unlevel, paved surfaces   Assistive device None   Gait Pattern Step-through pattern;Decreased stride length;Left flexed knee in stance;Decreased trunk rotation;Left foot flat  limited B hip extension   Ambulation Surface Level;Indoor   Stairs Yes   Stairs Assistance 5: Supervision;4: Min guard   Stairs Assistance Details (indicate cue type and reason) Without UE use (for functional LE strengthening, standning balance), pt ascended 4 stairs forwards x2 trials and laterally (RLE leading) x1 trial.   Stair Management Technique No rails;Step to pattern;Forwards;Sideways   Number of Stairs 12   Height of Stairs 6   Curb 5: Supervision   Self-Care   Self-Care Other Self-Care Comments   Other Self-Care Comments  Discussed current HEP and consolidated exercises to promote daily compliance. See Pt Instructions for details.   Neuro Re-ed    Neuro Re-ed Details  Passing/receiving passes with soccer bal with lateral stepping between passesl x7 consecutive minutes with min guard to increase single limb stance stability. Alternating LE cone taps 12 x3 for  increased dynamic single limb stance stability; initially required tactile cueing for lateral weight shift to R side during LLE tapping/   Exercises   Exercises Other Exercises   Other Exercises  Attempted prone B hip extension with knee flexion (for gluteus maximus isolation) x8 reps on R, x3 reps on L prior to pt requested to stop exercise due to discomfort in B hips. Therefore, transitioned to supine, bridging x10 reps. Attempted standing L hip ABD resisted by red Tband; however, pt demonstrated difficulty with technique; therefore,  transitioned to L clamshells x15 reps with cueing for hip alignment.                  PT Short Term Goals - 02/05/15 1406    PT SHORT TERM GOAL #1   Title Pt will perform initial HEP with mod I using paper handout to maximize funcitonal gains made in PT. Target date: 02/08/15   Baseline 02/05/15   Status Achieved   PT SHORT TERM GOAL #2   Title Perform 6MWT to assess functional endurance. Target date: 02/08/15   Baseline Per documentation on 12/8: 6MWT distance = 996'   Status Achieved   PT SHORT TERM GOAL #3   Title Pt will improve Berg score from 43/56 to 47/56 to indicate decreased risk of falling. Target date: 02/08/15   Baseline 02/05/15: scored 50/56 today   Status Achieved   PT SHORT TERM GOAL #4   Title Pt will negotiate 4 stairs with single rail and mod I to increase safety with community mobility. Target date: 02/08/15   Baseline on 02/05/15   Status Achieved   PT SHORT TERM GOAL #5   Title Pt will report initiation of walking program > / = 3 times per week to increase ambulation tolerance. Target date: 02/08/15   Baseline Met 12/23.   Status Achieved   PT SHORT TERM GOAL #6   Title Pt will improve gait velocity to > 2.62 ft/sec to indicate status of community ambulator. Target date: 02/08/15   Baseline 02/05/15: 2.44 ft/sec with no AD today   Status Not Met           PT Long Term Goals - 01/11/15 1838    PT LONG TERM GOAL #1   Title Pt will improve Berg score from 43/56 to 51/56 to indicate significant improvement in functional standing balance. Target date: 03/08/15   PT LONG TERM GOAL #2   Title Pt will improve ABC score from 29.4% to >43% to indicate significant improvement in balance confidence. Target date: 03/08/15   PT LONG TERM GOAL #3   Title Pt will improve 6MWT distance by 201' from baseline to indicate significant improvement in functional endurance. Target date: 03/08/15   PT LONG TERM GOAL #4   Title Pt will ambulate 1,000 over unlevel, paved surfaces  with mod I using LRAD to indicate increased stability with community mobility. Target date: 03/08/15   PT LONG TERM GOAL #5   Title Pt will negotiate standard ramp and curb step with mod I using LRAD to indicate increased independence traversing community obstacles. Target date: 03/08/15   Additional Long Term Goals   Additional Long Term Goals Yes   PT LONG TERM GOAL #6   Title Pt will perform all above aspects of functional mobility with no more than 2-point increase in within-session pain rating to indicate decreased impact of pain on functional activity tolerance. Target date: 03/07/14   PT LONG TERM GOAL #7   Title Pt will  report performance of daily walking program to return to prior fitness regimen without fear of falling. Target date: 03/08/15               Plan - 02/24/15 2005    Clinical Impression Statement Session continued to focus on single limb stance stability and functional LE strengthening with emphasis on B hip extensor/abductor strengthening. Pt demonstrating improvements but does require encouragement to attempt higher level balance activities due to decreased balance confidence, fear of falling.   Pt will benefit from skilled therapeutic intervention in order to improve on the following deficits Abnormal gait;Decreased activity tolerance;Decreased endurance;Decreased balance;Hypomobility;Impaired perceived functional ability;Decreased mobility;Decreased knowledge of use of DME;Pain   Rehab Potential Good   PT Frequency 2x / week   PT Duration 8 weeks   PT Treatment/Interventions ADLs/Self Care Home Management;DME Instruction;Gait training;Stair training;Functional mobility training;Therapeutic activities;Therapeutic exercise;Balance training;Orthotic Fit/Training;Patient/family education;Neuromuscular re-education;Manual techniques;Vestibular   PT Next Visit Plan single limb stance stability; standing balance; gait over unlevel surfaces; dynamic gait   PT Home Exercise  Plan See Pt Instructions from 1/18 for updated/consolidated HEP.   Consulted and Agree with Plan of Care Patient   Family Member Consulted Interpreter        Problem List Patient Active Problem List   Diagnosis Date Noted  . Pruritic condition 01/18/2015  . Actinic keratoses 09/13/2014  . Gait disorder 09/04/2014  . Rash and nonspecific skin eruption 09/04/2014  . Polymyalgia rheumatica ([place]) 04/20/2011  . INSOMNIA, PERSISTENT 06/26/2007  . HYPERCHOLESTEROLEMIA 02/20/2007  . Depression with anxiety 02/20/2007  . Hypothyroidism due to acquired atrophy of thyroid 12/03/2006  . Osteoarthritis 12/03/2006  . OSTEOPENIA 12/03/2006  . Migraine variant 11/26/2006  . IRRITABLE BOWEL SYNDROME 11/26/2006    Billie Ruddy, PT, DPT [facility] [address] [place], [place], [address] Phone: [phone]   Fax:  [phone] 02/24/2015, 8:11 PM  Name: MARYNA YEAGLE MRN: [id number] Date of Birth: 1927/02/24

## 2015-02-24 NOTE — Patient Instructions (Addendum)
Bridging    Slowly raise buttocks from floor, keeping stomach tight. Hold for 5 seconds. Repeat __10__ times per set. Do __1__ sets per session. Do __1-2__ sessions per day.  http://orth.exer.us/1096    Copyright  VHI. All rights reserved.  Clam Shell 45 Degrees    Lying with hips and knees bent, one pillow between knees and ankles. Lift knee. Be sure pelvis does not roll backward. Do not arch back and keep feet together. Hold for 5 seconds before slowly lowering leg back down. Perform with both legs. Do __15_ times, each leg, _1-2__ times per day.  http://ss.exer.us/75   Copyright  VHI. All rights reserved.  Functional Quadriceps: Sit to Stand    Sit on edge of chair/sturdy surface, feet flat on floor. Stand upright, extending knees fully. Repeat __12__ times per set. Do 1__ sets per session. Do _1-2_ sessions per day.  http://orth.exer.us/735   Copyright  VHI. All rights reserved.   Walking Program:  Begin walking for exercise for 5 minutes, 2 times/day, 5-6 days/week.  Progress your walking program by adding 1 minutes to your routine each week (every 5-6 days), as tolerated. When you reach 15 minutes of constant walking, decrease to 1 time a day. Continue to increase the total time every 5-6 days until you reach 30 minutes. Be sure to wear good walking shoes, walk in a safe environment and only progress to your tolerance.   Feet Apart (Compliant Surface) Varied Arm Positions - Arms by your side    Stand with your back to a corner with a stable chair in front of you. Stand on one pillow with feet shoulder width apart and arms by your side. Close eyes and visualize upright position. Hold__30__ seconds. Repeat __4__ times per session. Do __2_ sessions per day.  One-Legged Stand    Stand with your back to a corner with a stable chair in front of you. Standing on one leg, try to maintain balance __10__ seconds or as long as possible without support. Repeat on  other leg. Repeat __8-10__ times on each leg. Do __2_ sessions per day. .    Weight Shift: Anterior / Posterior (Righting / Equilibrium)    BEGIN WITH BACK LEANING AGAINST THE WALL AND FEET 4 INCHES AWAY. Move your hips off the wall and come to upright standing. *Make sure your hips move forward first (rather than your shoulders). Hold for 5 seconds.  Return slowly to the wall letting your hips bump the wall and return to stand.  Hold each position __5__ seconds. Repeat _10__ times per session. Do _5__ sessions per day.   Feet Heel-Toe "Tandem"    At counter with arms as needed for balance:walk a straight line forward bringing one foot directly in front of the other and then backwards by bringing one leg directly behind the other one.  Repeat for 3 laps each way. Do 1-2_ sessions per day.

## 2015-02-26 DIAGNOSIS — M1712 Unilateral primary osteoarthritis, left knee: Secondary | ICD-10-CM | POA: Diagnosis not present

## 2015-02-26 DIAGNOSIS — M25562 Pain in left knee: Secondary | ICD-10-CM | POA: Diagnosis not present

## 2015-03-01 ENCOUNTER — Ambulatory Visit: Payer: Medicare Other | Admitting: Physical Therapy

## 2015-03-02 ENCOUNTER — Ambulatory Visit: Payer: Medicare Other | Admitting: Physical Therapy

## 2015-03-02 DIAGNOSIS — R269 Unspecified abnormalities of gait and mobility: Secondary | ICD-10-CM | POA: Diagnosis not present

## 2015-03-02 DIAGNOSIS — R531 Weakness: Secondary | ICD-10-CM

## 2015-03-02 DIAGNOSIS — R2681 Unsteadiness on feet: Secondary | ICD-10-CM | POA: Diagnosis not present

## 2015-03-02 NOTE — Therapy (Signed)
[place] [address] [place], [place], [address] Phone: [phone]   Fax:  [phone]  Physical Therapy Treatment  Patient Details  Name: Anita Banks MRN: [id number] Date of Birth: 10-19-1927 Referring Provider: Lew Dawes, MD  Encounter Date: 03/02/2015      PT End of Session - 03/02/15 1646    Visit Number 14   Number of Visits 17   Date for PT Re-Evaluation 03/12/15   Authorization Type Medicare - GCodes and PN every 10 visits   PT Start Time 5176   PT Stop Time 1619   PT Time Calculation (min) 41 min   Equipment Utilized During Treatment Gait belt   Activity Tolerance Patient tolerated treatment well;No increased pain   Behavior During Therapy [facility] for tasks assessed/performed      Past Medical History  Diagnosis Date  . Hypertension   . Anxiety   . Migraine   . Arthritis     Past Surgical History  Procedure Laterality Date  . Abdominal hysterectomy    . Hemhorroid  2003    There were no vitals filed for this visit.  Visit Diagnosis:  Abnormality of gait  Unsteadiness  Weakness generalized      Subjective Assessment - 03/02/15 1540    Subjective Pt reports increased pain in R knee, stating, "It's because I'm not getting injections anymore." Pt would like for daughter to come in and see pt performing exercises to ensure pt is doing correctly at home.   Patient is accompained by: Interpreter   Pertinent History migraine headache, polymyalgia rheumatica, L knee OA with Baker's cyst, depression, anxiety   Patient Stated Goals "I want to be more stable and safe and be able to walk without risk to fall. I'd like to be more independent and walk by myself."   Currently in Pain? Yes   Pain Score 4    Pain Location Knee   Pain Orientation Left   Pain Descriptors / Indicators Aching;Sore   Pain Type Chronic pain   Pain Onset More than a month ago   Pain Frequency Constant   Aggravating Factors  increased activity, walking   Pain Relieving Factors rest, stretching, wearing brace   Multiple Pain Sites No                         [facility] Adult PT Treatment/Exercise - 03/02/15 0001    Ambulation/Gait   Ambulation/Gait Yes   Ambulation/Gait Assistance --   Ambulation/Gait Assistance Details Cueing for upright posture, forward gaze. Gait trial ended after 720' per pt request due to pt fatigue.   Ambulation Distance (Feet) 720 Feet   Assistive device None   Gait Pattern Step-through pattern;Decreased stride length;Left flexed knee in stance;Decreased trunk rotation;Left foot flat  limited B hip extension   Ambulation Surface Level;Indoor;Unlevel;Outdoor;Paved   Curb 5: Supervision   Furniture conservator/restorer   Sit to Stand Able to stand without using hands and stabilize independently   Standing Unsupported Able to stand safely 2 minutes   Sitting with Back Unsupported but Feet Supported on Floor or Stool Able to sit safely and securely 2 minutes   Stand to Sit Sits safely with minimal use of hands   Transfers Able to transfer safely, minor use of hands   Standing Unsupported with Eyes Closed Able to stand 10 seconds safely   Standing Ubsupported with Feet Together Able to place feet together independently and stand 1 minute safely  From Standing, Reach Forward with Outstretched Arm Can reach confidently >25 cm (10")   From Standing Position, Pick up Object from [place] to pick up shoe safely and easily   From Standing Position, Turn to Look Behind Over each Shoulder Looks behind from both sides and weight shifts well   Turn 360 Degrees Able to turn 360 degrees safely one side only in 4 seconds or less   Standing Unsupported, Alternately Place Feet on Step/Stool Able to stand independently and safely and complete 8 steps in 20 seconds   Standing Unsupported, One Foot in Front Able to plae foot ahead of the other independently and hold 30 seconds    Standing on One Leg Able to lift leg independently and hold equal to or more than 3 seconds   Total Score 52                PT Education - 03/02/15 [place]    Education provided Yes   Education Details Merrilee Jansky findings, progress, and functional implications.   Person(s) Educated Patient;Other (comment)  interpreter   Methods Explanation   Comprehension Verbalized understanding          PT Short Term Goals - 02/05/15 1406    PT SHORT TERM GOAL #1   Title Pt will perform initial HEP with mod I using paper handout to maximize funcitonal gains made in PT. Target date: 02/08/15   Baseline 02/05/15   Status Achieved   PT SHORT TERM GOAL #2   Title Perform 6MWT to assess functional endurance. Target date: 02/08/15   Baseline Per documentation on 12/8: 6MWT distance = 996'   Status Achieved   PT SHORT TERM GOAL #3   Title Pt will improve Berg score from 43/56 to 47/56 to indicate decreased risk of falling. Target date: 02/08/15   Baseline 02/05/15: scored 50/56 today   Status Achieved   PT SHORT TERM GOAL #4   Title Pt will negotiate 4 stairs with single rail and mod I to increase safety with community mobility. Target date: 02/08/15   Baseline on 02/05/15   Status Achieved   PT SHORT TERM GOAL #5   Title Pt will report initiation of walking program > / = 3 times per week to increase ambulation tolerance. Target date: 02/08/15   Baseline Met 12/23.   Status Achieved   PT SHORT TERM GOAL #6   Title Pt will improve gait velocity to > 2.62 ft/sec to indicate status of community ambulator. Target date: 02/08/15   Baseline 02/05/15: 2.44 ft/sec with no AD today   Status Not Met           PT Long Term Goals - 03/02/15 1605    PT LONG TERM GOAL #1   Title Pt will improve Berg score from 43/56 to 51/56 to indicate significant improvement in functional standing balance. Target date: 03/08/15   Baseline 1/24: Berg score = 52/56   Status Achieved   PT LONG TERM GOAL #2   Title Pt will improve  ABC score from 29.4% to >43% to indicate significant improvement in balance confidence. Target date: 03/08/15   PT LONG TERM GOAL #3   Title Pt will improve 6MWT distance by 201' from baseline to indicate significant improvement in functional endurance. Target date: 03/08/15   PT LONG TERM GOAL #4   Title Pt will ambulate 1,000 over unlevel, paved surfaces with mod I using LRAD to indicate increased stability with community mobility. Target date: 03/08/15   PT LONG  TERM GOAL #5   Title Pt will negotiate standard ramp and curb step with mod I using LRAD to indicate increased independence traversing community obstacles. Target date: 03/08/15   PT LONG TERM GOAL #6   Title Pt will perform all above aspects of functional mobility with no more than 2-point increase in within-session pain rating to indicate decreased impact of pain on functional activity tolerance. Target date: 03/07/14   PT LONG TERM GOAL #7   Title Pt will report performance of daily walking program to return to prior fitness regimen without fear of falling. Target date: 03/08/15   Baseline Met 1/24.   Status Achieved               Plan - 03/02/15 1647    Clinical Impression Statement Session focused on assessing functional standing balance and gait over unlevel, paved surfaces. Pt met LTG for Berg score, which has improved to 52/56. Pt ambulated 720' consecuitvely over unlevel, paved surfaces prior to requesting seated rest break. Pt will continue to benefit from skilled outpatient PT to maximize stability/independence with functional mobility.   Pt will benefit from skilled therapeutic intervention in order to improve on the following deficits Abnormal gait;Decreased activity tolerance;Decreased endurance;Decreased balance;Hypomobility;Impaired perceived functional ability;Decreased mobility;Decreased knowledge of use of DME;Pain   Rehab Potential Good   PT Frequency 2x / week   PT Duration 8 weeks   PT Treatment/Interventions  ADLs/Self Care Home Management;DME Instruction;Gait training;Stair training;Functional mobility training;Therapeutic activities;Therapeutic exercise;Balance training;Orthotic Fit/Training;Patient/family education;Neuromuscular re-education;Manual techniques;Vestibular   PT Next Visit Plan stair negotiation; gait over unlevel surfaces; dynamic gait; functional LE strengthening; balance confidence   Consulted and Agree with Plan of Care Patient;Other (Comment)   Family Member Consulted Interpreter        Problem List Patient Active Problem List   Diagnosis Date Noted  . Pruritic condition 01/18/2015  . Actinic keratoses 09/13/2014  . Gait disorder 09/04/2014  . Rash and nonspecific skin eruption 09/04/2014  . Polymyalgia rheumatica ([place]) 04/20/2011  . INSOMNIA, PERSISTENT 06/26/2007  . HYPERCHOLESTEROLEMIA 02/20/2007  . Depression with anxiety 02/20/2007  . Hypothyroidism due to acquired atrophy of thyroid 12/03/2006  . Osteoarthritis 12/03/2006  . OSTEOPENIA 12/03/2006  . Migraine variant 11/26/2006  . IRRITABLE BOWEL SYNDROME 11/26/2006   Billie Ruddy, PT, DPT [facility] [address] [place], [place], [address] Phone: [phone]   Fax:  [phone] 03/02/2015, 4:52 PM  Name: Anita Banks MRN: [id number] Date of Birth: 07-26-1927

## 2015-03-03 ENCOUNTER — Ambulatory Visit: Payer: Medicare Other | Admitting: Rehabilitation

## 2015-03-03 ENCOUNTER — Encounter: Payer: Self-pay | Admitting: Rehabilitation

## 2015-03-03 DIAGNOSIS — R531 Weakness: Secondary | ICD-10-CM

## 2015-03-03 DIAGNOSIS — R269 Unspecified abnormalities of gait and mobility: Secondary | ICD-10-CM | POA: Diagnosis not present

## 2015-03-03 DIAGNOSIS — R2681 Unsteadiness on feet: Secondary | ICD-10-CM | POA: Diagnosis not present

## 2015-03-03 NOTE — Therapy (Signed)
[facility] [address] [place], [place], [address] Phone: [phone]   Fax:  [phone]  Physical Therapy Treatment  Patient Details  Name: Anita Banks MRN: [id number] Date of Birth: [birth year] Referring Provider: Lew Dawes, MD  Encounter Date: 03/03/2015      PT End of Session - 03/03/15 1820    Visit Number 15   Number of Visits 17   Date for PT Re-Evaluation 03/12/15   Authorization Type Medicare - GCodes and PN every 10 visits   PT Start Time 2878   PT Stop Time 1616   PT Time Calculation (min) 43 min   Equipment Utilized During Treatment Gait belt   Activity Tolerance Patient tolerated treatment well;No increased pain   Behavior During Therapy [facility] for tasks assessed/performed      Past Medical History  Diagnosis Date  . Hypertension   . Anxiety   . Migraine   . Arthritis     Past Surgical History  Procedure Laterality Date  . Abdominal hysterectomy    . Hemhorroid  2003    There were no vitals filed for this visit.  Visit Diagnosis:  Abnormality of gait  Unsteadiness  Weakness generalized      Subjective Assessment - 03/03/15 1537    Subjective No new complaints, continues to have L knee pain."    Patient is accompained by: Interpreter   Pertinent History migraine headache, polymyalgia rheumatica, L knee OA with Baker's cyst, depression, anxiety   Patient Stated Goals "I want to be more stable and safe and be able to walk without risk to fall. I'd like to be more independent and walk by myself."   Currently in Pain? Yes   Pain Score 3    Pain Location Knee   Pain Orientation Left   Pain Descriptors / Indicators Aching;Sore   Pain Type Chronic pain   Pain Onset More than a month ago   Pain Frequency Constant   Aggravating Factors  increased activity, walking   Pain Relieving Factors rest, stretcing, wearing brace           TE:  Assessed functional  endurance with performance of 6MWT to address LTG.  Note that distance has decreased from 996' in December to 813' when tested today.  She felt that this was due to "not feeling well today."  She reports headache and L knee pain.  Note she continues to have antalgic gait pattern, lateral hip instability, but no overt LOB during testing.    NMR:  Performed high level balance during session with negotiation of obstacle course and periods of SLS to further challenge balance.  Performed stepping to dots on floor progressing to triangular blocks on floor (varying in size) to stepping over orange barriers.  Performed x 30' x 4 reps during session with seated rest break between first 2 reps.  Note that she requires min A during stepping on triangular blocks, but did very well with stepping over barriers when leading with RLE as she is unable to fully flex L knee due to increased pain.    Gait: Performed approx 600' gait on varying outdoor surfaces (uneven paved and grass) as well as curb negotiation.  Performed all at close S level during task, esp with ambulation over grass for safety.  Pt states that she ambulates daily outdoors with assist from caregiver.  Again, pt very fatigued following task and requires lengthy seated rest break.  PT Education - 03/03/15 1819    Education provided Yes   Education Details education on 6MWT findings   Person(s) Educated Patient;Other (comment)  interpretor   Methods Explanation   Comprehension Verbalized understanding          PT Short Term Goals - 02/05/15 1406    PT SHORT TERM GOAL #1   Title Pt will perform initial HEP with mod I using paper handout to maximize funcitonal gains made in PT. Target date: 02/08/15   Baseline 02/05/15   Status Achieved   PT SHORT TERM GOAL #2   Title Perform 6MWT to assess functional endurance. Target date: 02/08/15   Baseline Per documentation on 12/8: 6MWT distance = 996'   Status Achieved    PT SHORT TERM GOAL #3   Title Pt will improve Berg score from 43/56 to 47/56 to indicate decreased risk of falling. Target date: 02/08/15   Baseline 02/05/15: scored 50/56 today   Status Achieved   PT SHORT TERM GOAL #4   Title Pt will negotiate 4 stairs with single rail and mod I to increase safety with community mobility. Target date: 02/08/15   Baseline on 02/05/15   Status Achieved   PT SHORT TERM GOAL #5   Title Pt will report initiation of walking program > / = 3 times per week to increase ambulation tolerance. Target date: 02/08/15   Baseline Met 12/23.   Status Achieved   PT SHORT TERM GOAL #6   Title Pt will improve gait velocity to > 2.62 ft/sec to indicate status of community ambulator. Target date: 02/08/15   Baseline 02/05/15: 2.44 ft/sec with no AD today   Status Not Met           PT Long Term Goals - 03/03/15 1549    PT LONG TERM GOAL #1   Title Pt will improve Berg score from 43/56 to 51/56 to indicate significant improvement in functional standing balance. Target date: 03/08/15   Baseline 1/24: Berg score = 52/56   Status Achieved   PT LONG TERM GOAL #2   Title Pt will improve ABC score from 29.4% to >43% to indicate significant improvement in balance confidence. Target date: 03/08/15   PT LONG TERM GOAL #3   Title Pt will improve 6MWT distance by 201' from baseline to indicate significant improvement in functional endurance. Target date: 03/08/15   Baseline 996' on 01/14/16, 813' on 03/03/15   Status Not Met   PT LONG TERM GOAL #4   Title Pt will ambulate 1,000 over unlevel, paved surfaces with mod I using LRAD to indicate increased stability with community mobility. Target date: 03/08/15   PT LONG TERM GOAL #5   Title Pt will negotiate standard ramp and curb step with mod I using LRAD to indicate increased independence traversing community obstacles. Target date: 03/08/15   PT LONG TERM GOAL #6   Title Pt will perform all above aspects of functional mobility with no more  than 2-point increase in within-session pain rating to indicate decreased impact of pain on functional activity tolerance. Target date: 03/07/14   PT LONG TERM GOAL #7   Title Pt will report performance of daily walking program to return to prior fitness regimen without fear of falling. Target date: 03/08/15   Baseline Met 1/24.   Status Achieved               Plan - 03/03/15 1820    Clinical Impression Statement Skilled session focused on assessing functional endurance  with 6MWT.  Note that distance has decreased from previous score in December, however pt relates that it is likely due to having migraine today and increased L knee pain.  Also continue to focus on gait on outdoor surfaces as well as high level balance.    Pt will benefit from skilled therapeutic intervention in order to improve on the following deficits Abnormal gait;Decreased activity tolerance;Decreased endurance;Decreased balance;Hypomobility;Impaired perceived functional ability;Decreased mobility;Decreased knowledge of use of DME;Pain   Rehab Potential Good   PT Frequency 2x / week   PT Duration 8 weeks   PT Treatment/Interventions ADLs/Self Care Home Management;DME Instruction;Gait training;Stair training;Functional mobility training;Therapeutic activities;Therapeutic exercise;Balance training;Orthotic Fit/Training;Patient/family education;Neuromuscular re-education;Manual techniques;Vestibular   PT Next Visit Plan stair negotiation; gait over unlevel surfaces; dynamic gait; functional LE strengthening; balance confidence, begin looking at LTGs.    Consulted and Agree with Plan of Care Patient;Other (Comment)   Family Member Consulted Interpreter        Problem List Patient Active Problem List   Diagnosis Date Noted  . Pruritic condition 01/18/2015  . Actinic keratoses 09/13/2014  . Gait disorder 09/04/2014  . Rash and nonspecific skin eruption 09/04/2014  . Polymyalgia rheumatica ([place]) 04/20/2011  .  INSOMNIA, PERSISTENT 06/26/2007  . HYPERCHOLESTEROLEMIA 02/20/2007  . Depression with anxiety 02/20/2007  . Hypothyroidism due to acquired atrophy of thyroid 12/03/2006  . Osteoarthritis 12/03/2006  . OSTEOPENIA 12/03/2006  . Migraine variant 11/26/2006  . IRRITABLE BOWEL SYNDROME 11/26/2006    Cameron Sprang, PT, MPT [facility] [address] [place], [place], [address] Phone: [phone]   Fax:  [phone] 03/03/2015, 6:23 PM  Name: Anita Banks MRN: [id number] Date of Birth: 1927-04-14

## 2015-03-08 ENCOUNTER — Ambulatory Visit: Payer: Medicare Other | Admitting: Physical Therapy

## 2015-03-08 ENCOUNTER — Encounter: Payer: Self-pay | Admitting: Physical Therapy

## 2015-03-08 DIAGNOSIS — R531 Weakness: Secondary | ICD-10-CM | POA: Diagnosis not present

## 2015-03-08 DIAGNOSIS — R2681 Unsteadiness on feet: Secondary | ICD-10-CM | POA: Diagnosis not present

## 2015-03-08 DIAGNOSIS — R269 Unspecified abnormalities of gait and mobility: Secondary | ICD-10-CM | POA: Diagnosis not present

## 2015-03-09 NOTE — Therapy (Signed)
[facility] [address] [place], [place], [address] Phone: [phone]   Fax:  [phone]  Physical Therapy Treatment  Patient Details  Name: Anita Banks MRN: [id number] Date of Birth: January 11, 1928 Referring Provider: Lew Dawes, MD  Encounter Date: 03/08/2015      PT End of Session - 03/08/15 1536    Visit Number 16   Number of Visits 17   Date for PT Re-Evaluation 03/12/15   Authorization Type Medicare - GCodes and PN every 10 visits   PT Start Time 6160   PT Stop Time 1613   PT Time Calculation (min) 40 min   Equipment Utilized During Treatment Gait belt   Activity Tolerance Patient tolerated treatment well;No increased pain   Behavior During Therapy [facility] for tasks assessed/performed      Past Medical History  Diagnosis Date  . Hypertension   . Anxiety   . Migraine   . Arthritis     Past Surgical History  Procedure Laterality Date  . Abdominal hysterectomy    . Hemhorroid  2003    There were no vitals filed for this visit.  Visit Diagnosis:  Abnormality of gait  Unsteadiness  Weakness generalized      Subjective Assessment - 03/08/15 1535    Subjective No new complaints, continues to have L knee pain."  No falls to report.   Patient is accompained by: Interpreter  Sergy   Pertinent History migraine headache, polymyalgia rheumatica, L knee OA with Baker's cyst, depression, anxiety   Patient Stated Goals "I want to be more stable and safe and be able to walk without risk to fall. I'd like to be more independent and walk by myself."   Currently in Pain? Yes   Pain Score 4    Pain Location Knee   Pain Orientation Left   Pain Descriptors / Indicators Aching;Sore   Pain Type Chronic pain   Pain Frequency Constant   Aggravating Factors  increased activity , walking   Pain Relieving Factors rest, stretching, wearing brace            [facility] PT Assessment - 03/08/15 1546    6 Minute Walk- Baseline   Modified Borg Scale for Dyspnea 0- Nothing at all   Perceived Rate of Exertion (Borg) 6-   6 Minute walk- Post Test   Modified Borg Scale for Dyspnea 1- Very mild shortness of breath   Perceived Rate of Exertion (Borg) 10-   6 minute walk test results    Aerobic Endurance Distance Walked 1031   Endurance additional comments No device, gait cadence maintained throughout , no imbalance noted           [facility] Adult PT Treatment/Exercise - 03/08/15 1547    Ambulation/Gait   Ambulation/Gait Yes   Ambulation/Gait Assistance 6: Modified independent (Device/Increase time)   Ambulation/Gait Assistance Details no cues needed or assistance needed. No loss of balance noted with both gait indoors or outdoors. Left knee pain 4/10 after all gait trials performed.   Ambulation Distance (Feet) 1088 Feet  x1 (includes 6 minute walk);1000 x1 outdoor/indoor   Assistive device None   Gait Pattern Step-through pattern;Decreased stride length;Left flexed knee in stance;Decreased trunk rotation;Left foot flat   Ambulation Surface Level;Indoor;Unlevel;Outdoor;Paved;Grass     Self care:  Pt scored 28.98% today on ABC scale. Verbally reviewed HEP and walking program for home. Pt walking almost daily up ot 30 minutes. Intermittent with HEP for strengthening and balance. Reinforced regular compliance for  optimal results.         PT Short Term Goals - 02/05/15 1406    PT SHORT TERM GOAL #1   Title Pt will perform initial HEP with mod I using paper handout to maximize funcitonal gains made in PT. Target date: 02/08/15   Baseline 02/05/15   Status Achieved   PT SHORT TERM GOAL #2   Title Perform 6MWT to assess functional endurance. Target date: 02/08/15   Baseline Per documentation on 12/8: 6MWT distance = 996'   Status Achieved   PT SHORT TERM GOAL #3   Title Pt will improve Berg score from 43/56 to 47/56 to indicate decreased risk of falling. Target date: 02/08/15   Baseline  02/05/15: scored 50/56 today   Status Achieved   PT SHORT TERM GOAL #4   Title Pt will negotiate 4 stairs with single rail and mod I to increase safety with community mobility. Target date: 02/08/15   Baseline on 02/05/15   Status Achieved   PT SHORT TERM GOAL #5   Title Pt will report initiation of walking program > / = 3 times per week to increase ambulation tolerance. Target date: 02/08/15   Baseline Met 12/23.   Status Achieved   PT SHORT TERM GOAL #6   Title Pt will improve gait velocity to > 2.62 ft/sec to indicate status of community ambulator. Target date: 02/08/15   Baseline 02/05/15: 2.44 ft/sec with no AD today   Status Not Met           PT Long Term Goals - 03/08/15 1536    PT LONG TERM GOAL #1   Title Pt will improve Berg score from 43/56 to 51/56 to indicate significant improvement in functional standing balance. Target date: 03/08/15   Baseline 1/24: Berg score = 52/56   Status Achieved   PT LONG TERM GOAL #2   Title Pt will improve ABC score from 29.4% to >43% to indicate significant improvement in balance confidence. Target date: 03/08/15   Baseline 03/08/15: 28.98% scored today.   Status Not Met   PT LONG TERM GOAL #3   Title Pt will improve 6MWT distance by 201' from baseline to indicate significant improvement in functional endurance. Target date: 03/08/15   Baseline 996' on 01/14/16;  813' on 03/03/15; 1031 feet  on 03/08/15   Status Not Met   PT LONG TERM GOAL #4   Title Pt will ambulate 1,000 over unlevel, paved surfaces with mod I using LRAD to indicate increased stability with community mobility. Target date: 03/08/15   Baseline on 03/07/14   Status Achieved   PT LONG TERM GOAL #5   Title Pt will negotiate standard ramp and curb step with mod I using LRAD to indicate increased independence traversing community obstacles. Target date: 03/08/15   Status On-going   PT LONG TERM GOAL #6   Title Pt will perform all above aspects of functional mobility with no more than  2-point increase in within-session pain rating to indicate decreased impact of pain on functional activity tolerance. Target date: 03/07/14   Baseline 03/07/14: 4/10 after all gait activities today   Status Achieved   PT LONG TERM GOAL #7   Title Pt will report performance of daily walking program to return to prior fitness regimen without fear of falling. Target date: 03/08/15   Baseline Met 1/24.   Status Achieved               Plan - 03/08/15 1536  Clinical Impression Statement Pt had met 4 of 7 LTGs to date and making progress toward other goals. Pt is making progress toward all unmet goals.    Pt will benefit from skilled therapeutic intervention in order to improve on the following deficits Abnormal gait;Decreased activity tolerance;Decreased endurance;Decreased balance;Hypomobility;Impaired perceived functional ability;Decreased mobility;Decreased knowledge of use of DME;Pain   Rehab Potential Good   PT Frequency 2x / week   PT Duration 8 weeks   PT Treatment/Interventions ADLs/Self Care Home Management;DME Instruction;Gait training;Stair training;Functional mobility training;Therapeutic activities;Therapeutic exercise;Balance training;Orthotic Fit/Training;Patient/family education;Neuromuscular re-education;Manual techniques;Vestibular   PT Next Visit Plan assess remaining LTGs and finialize HEP with pt/daughter.   Consulted and Agree with Plan of Care Patient;Other (Comment)   Family Member Consulted Interpreter        Problem List Patient Active Problem List   Diagnosis Date Noted  . Pruritic condition 01/18/2015  . Actinic keratoses 09/13/2014  . Gait disorder 09/04/2014  . Rash and nonspecific skin eruption 09/04/2014  . Polymyalgia rheumatica ([place]) 04/20/2011  . INSOMNIA, PERSISTENT 06/26/2007  . HYPERCHOLESTEROLEMIA 02/20/2007  . Depression with anxiety 02/20/2007  . Hypothyroidism due to acquired atrophy of thyroid 12/03/2006  . Osteoarthritis 12/03/2006  .  OSTEOPENIA 12/03/2006  . Migraine variant 11/26/2006  . IRRITABLE BOWEL SYNDROME 11/26/2006    Willow Ora 03/09/2015, 10:28 PM  Willow Ora, PTA, [place] [address], [place], [place] [address] [phone] 03/09/2015, 10:33 PM   Name: Anita Banks MRN: [id number] Date of Birth: 1927-10-13

## 2015-03-10 ENCOUNTER — Ambulatory Visit: Payer: Medicare Other | Attending: Internal Medicine | Admitting: Physical Therapy

## 2015-03-10 DIAGNOSIS — R2681 Unsteadiness on feet: Secondary | ICD-10-CM

## 2015-03-10 DIAGNOSIS — R531 Weakness: Secondary | ICD-10-CM | POA: Insufficient documentation

## 2015-03-10 DIAGNOSIS — R269 Unspecified abnormalities of gait and mobility: Secondary | ICD-10-CM

## 2015-03-10 NOTE — Patient Instructions (Addendum)
Bridging    Slowly raise buttocks from floor, keeping stomach tight. Hold for 5 seconds. Repeat __10__ times per set. Do __1__ sets per session. Do __1-2__ sessions per day.  As this exercise becomes easier, increase by 2-3 reps at a time.   Copyright  VHI. All rights reserved.  Clam Shell 45 Degrees    Lying with hips and knees bent, one pillow between knees and ankles. Lift knee. Be sure pelvis does not roll backward. Do not arch back and keep feet together. Hold for 5 seconds before slowly lowering leg back down. Perform with both legs. Do __15_ times, each leg, _1-2__ times per day.  As this exercise becomes easier, increase by 2-3 reps at a time.  Hip Flexor Stretch    Lying on back near edge of bed, bend one leg, foot flat. Hang other leg over edge, relaxed, thigh hanging off edge of bed for 60 seconds. Repeat _2___ times per day on each leg.  As this stretch gets easier, bring knee to chest (right only) to increase the stretch.           Copyright  VHI. All rights reserved.  Functional Quadriceps: Sit to Stand    Sit on edge of chair/sturdy surface, feet flat on floor. Stand upright, extending knees fully. Repeat __14__ times per set. Do 1__ sets per session. Do _1-2_ sessions per day.    Feet Apart (Compliant Surface) Varied Arm Positions - Arms by your side    Stand with your back to a corner with a stable chair in front of you. Stand on one pillow with feet shoulder width apart and arms by your side. Close eyes and visualize upright position. Hold__30__ seconds. Repeat __4__ times per session. Do __2_ sessions per day. Progress by placing feet together (make sure your daughter is beside you the first few times, just in case).  One-Legged Stand    Stand with your back to a corner with a stable chair in front of you. Standing on one leg, try to maintain balance __10__ seconds or as long as possible without support. Repeat on other leg. Repeat  __8-10__ times on each leg. Do __2_ sessions per day. .    Weight Shift: Anterior / Posterior (Righting / Equilibrium)    BEGIN WITH BACK LEANING AGAINST THE WALL AND FEET 4 INCHES AWAY. Move your hips off the wall and come to upright standing. *Make sure your hips move forward first (rather than your shoulders). Hold for 5 seconds.  Return slowly to the wall letting your hips bump the wall and return to stand.  Hold each position __5__ seconds. Repeat _10__ times per session. Do _5__ sessions per day.   Feet Heel-Toe "Tandem"    At counter with one hand on countertop or balance:walk a straight line forward bringing one foot directly in front of the other. Progress from one hand on the countertop to finger tips on countertop. Repeat for 3 laps each way. Do 1-2_ sessions per day.

## 2015-03-10 NOTE — Therapy (Signed)
[facility] [address] [place], [place], [address] Phone: [phone]   Fax:  [phone]  Physical Therapy Treatment  Patient Details  Name: Anita Banks MRN: [id number] Date of Birth: [birth year] Referring Provider: Lew Dawes, MD  Encounter Date: 03/10/2015      PT End of Session - 03/10/15 1720    Visit Number 17   Number of Visits 17   Date for PT Re-Evaluation 03/12/15   Authorization Type Medicare - GCodes and PN every 10 visits   PT Start Time 44  Pt arrived late to session   PT Stop Time 1408   PT Time Calculation (min) 43 min   Activity Tolerance Patient tolerated treatment well;No increased pain   Behavior During Therapy [facility] for tasks assessed/performed      Past Medical History  Diagnosis Date  . Hypertension   . Anxiety   . Migraine   . Arthritis     Past Surgical History  Procedure Laterality Date  . Abdominal hysterectomy    . Hemhorroid  2003    There were no vitals filed for this visit.  Visit Diagnosis:  Abnormality of gait  Unsteadiness  Weakness generalized      Subjective Assessment - 03/10/15 1328    Subjective Pt arrived accompanied by daughter and interpreter. Pt denies falls and reports no significant changes.    Patient is accompained by: Family member;Interpreter  daughter, Anita Banks, and interpreter   Pertinent History migraine headache, polymyalgia rheumatica, L knee OA with Baker's cyst, depression, anxiety   Patient Stated Goals "I want to be more stable and safe and be able to walk without risk to fall. I'd like to be more independent and walk by myself."   Currently in Pain? Yes   Pain Score 4    Pain Location Knee   Pain Orientation Left   Pain Descriptors / Indicators Aching;Sore   Pain Type Chronic pain   Pain Onset More than a month ago   Pain Frequency Constant   Aggravating Factors  increased activity, walking   Pain Relieving Factors  rest, stretching, wearing brace                         [facility] Adult PT Treatment/Exercise - 03/10/15 0001    Ambulation/Gait   Curb 5: Supervision   Curb Details (indicate cue type and reason) Cueing for safe proximity to step, anterior weight shift during curb ascent.   Neuro Re-ed    Neuro Re-ed Details  With mod cueing from this PT, pt performed home balance HEP provided during previous sessions. Daughter provided SBA due to pt anxiety with EC on pillow and single limb stance. See Pt Instructions for details on all exercises, reps, sets, frequency, and duration.   Exercises   Exercises Other Exercises   Other Exercises  With use of paper handout, pt performed al home strengthening.stretching exercises provided during previous PT sessions.  PT simultaneously instructed  daughter on how to safely progress exercises with verbal understanding from daughter. Paper handout provided to promote carryover. See Pt Instructions for details on all exercises, reps, sets, frequency, and duration.                 PT Education - 03/10/15 1715    Education provided Yes   Education Details Pt goals, progress, and DC plan. Discussed and performed all home exercises with daughter present.   Person(s) Educated Patient;Child(ren)   Methods Explanation;Demonstration;Tactile  cues;Verbal cues;Handout   Comprehension Verbalized understanding;Returned demonstration          PT Short Term Goals - 02/05/15 1406    PT SHORT TERM GOAL #1   Title Pt will perform initial HEP with mod I using paper handout to maximize funcitonal gains made in PT. Target date: 02/08/15   Baseline 02/05/15   Status Achieved   PT SHORT TERM GOAL #2   Title Perform 6MWT to assess functional endurance. Target date: 02/08/15   Baseline Per documentation on 12/8: 6MWT distance = 996'   Status Achieved   PT SHORT TERM GOAL #3   Title Pt will improve Berg score from 43/56 to 47/56 to indicate decreased risk of  falling. Target date: 02/08/15   Baseline 02/05/15: scored 50/56 today   Status Achieved   PT SHORT TERM GOAL #4   Title Pt will negotiate 4 stairs with single rail and mod I to increase safety with community mobility. Target date: 02/08/15   Baseline on 02/05/15   Status Achieved   PT SHORT TERM GOAL #5   Title Pt will report initiation of walking program > / = 3 times per week to increase ambulation tolerance. Target date: 02/08/15   Baseline Met 12/23.   Status Achieved   PT SHORT TERM GOAL #6   Title Pt will improve gait velocity to > 2.62 ft/sec to indicate status of community ambulator. Target date: 02/08/15   Baseline 02/05/15: 2.44 ft/sec with no AD today   Status Not Met           PT Long Term Goals - 03/10/15 1721    PT LONG TERM GOAL #1   Title Pt will improve Berg score from 43/56 to 51/56 to indicate significant improvement in functional standing balance. Target date: 03/08/15   Baseline 1/24: Berg score = 52/56   Status Achieved   PT LONG TERM GOAL #2   Title Pt will improve ABC score from 29.4% to >43% to indicate significant improvement in balance confidence. Target date: 03/08/15   Baseline 03/08/15: 28.98% scored today.   Status Not Met   PT LONG TERM GOAL #3   Title Pt will improve 6MWT distance by 201' from baseline to indicate significant improvement in functional endurance. Target date: 03/08/15   Baseline 996' on 01/14/16;  813' on 03/03/15; 1031 feet  on 03/08/15   Status Not Met   PT LONG TERM GOAL #4   Title Pt will ambulate 1,000 over unlevel, paved surfaces with mod I using LRAD to indicate increased stability with community mobility. Target date: 03/08/15   Baseline on 03/07/14   Status Achieved   PT LONG TERM GOAL #5   Title Pt will negotiate standard ramp and curb step with mod I using LRAD to indicate increased independence traversing community obstacles. Target date: 03/08/15   Baseline 2/1: Pt continues to require supervision (no assistive device) due to  decreased balance confidence, fear of falling.   Status Not Met   PT LONG TERM GOAL #6   Title Pt will perform all above aspects of functional mobility with no more than 2-point increase in within-session pain rating to indicate decreased impact of pain on functional activity tolerance. Target date: 03/07/14   Baseline 03/07/14: 4/10 after all gait activities today   Status Achieved   PT LONG TERM GOAL #7   Title Pt will report performance of daily walking program to return to prior fitness regimen without fear of falling. Target date: 03/08/15  Baseline Met 1/24.   Status Achieved               Plan - 24-Mar-2015 1723    Clinical Impression Statement Pt has met 5 / 6 STG's and 4 / 7 LTG's and has demonstrated significant improvement in stability/independence with functional mobility and therefore will be discharged from outpatient PT at this time. Goals unmet were: 6MWT, negotiation of curb step with mod I, and ABC Scale. Educated pt and daughter (via interpreter) on PT goals, findings, progress, and DC plan with verbal understanding and full agreement from pt/daughter.    Consulted and Agree with Plan of Care Patient;Family member/caregiver;Other (Comment)   Family Member Consulted daughter and interpreter          G-Codes - 2015-03-24 1727    Functional Assessment Tool Used Merrilee Jansky 52/56   Functional Limitation Mobility: Walking and moving around   Mobility: Walking and Moving Around Goal Status [phone]) At least 1 percent but less than 20 percent impaired, limited or restricted   Mobility: Walking and Moving Around Discharge Status [phone]) At least 1 percent but less than 20 percent impaired, limited or restricted     PHYSICAL THERAPY DISCHARGE SUMMARY  Visits from Start of Care: 17  Current functional level related to goals / functional outcomes: See all goals and goal statuses above.   Remaining deficits: Pt continues to demonstrate decreased balance confidence, which limits pt  stability with negotiation of curb step, ramp, and other community obstacles. Per daughter, pt's fear of falling may be attributable to visual impairments and to pt's husband's functional decline after a fall.   Education / Equipment: HEP and progression for LE strengthening and balance.  Plan: Patient agrees to discharge.  Patient goals were partially met. Patient is being discharged due to meeting the stated rehab goals.  ?????       Problem List  Patient Active Problem List   Diagnosis Date Noted  . Pruritic condition 01/18/2015  . Actinic keratoses 09/13/2014  . Gait disorder 09/04/2014  . Rash and nonspecific skin eruption 09/04/2014  . Polymyalgia rheumatica ([place]) 04/20/2011  . INSOMNIA, PERSISTENT 06/26/2007  . HYPERCHOLESTEROLEMIA 02/20/2007  . Depression with anxiety 02/20/2007  . Hypothyroidism due to acquired atrophy of thyroid 12/03/2006  . Osteoarthritis 12/03/2006  . OSTEOPENIA 12/03/2006  . Migraine variant 11/26/2006  . IRRITABLE BOWEL SYNDROME 11/26/2006   Billie Ruddy, PT, DPT [facility] [address] [place], [place], [address] Phone: [phone]   Fax:  [phone] Mar 24, 2015, 5:27 PM  Name: Anita Banks MRN: [id number] Date of Birth: Jun 24, 1927

## 2015-03-24 ENCOUNTER — Other Ambulatory Visit: Payer: Self-pay | Admitting: *Deleted

## 2015-03-24 MED ORDER — LEVOTHYROXINE SODIUM 112 MCG PO TABS: 112.0000 ug | ORAL_TABLET | Freq: Every day | ORAL | Status: DC

## 2015-04-09 DIAGNOSIS — Z779 Other contact with and (suspected) exposures hazardous to health: Secondary | ICD-10-CM | POA: Diagnosis not present

## 2015-04-09 DIAGNOSIS — R87619 Unspecified abnormal cytological findings in specimens from cervix uteri: Secondary | ICD-10-CM | POA: Diagnosis not present

## 2015-04-09 DIAGNOSIS — N814 Uterovaginal prolapse, unspecified: Secondary | ICD-10-CM | POA: Diagnosis not present

## 2015-04-09 DIAGNOSIS — T8389XA Other specified complication of genitourinary prosthetic devices, implants and grafts, initial encounter: Secondary | ICD-10-CM | POA: Diagnosis not present

## 2015-04-09 DIAGNOSIS — Z1231 Encounter for screening mammogram for malignant neoplasm of breast: Secondary | ICD-10-CM | POA: Diagnosis not present

## 2015-04-09 DIAGNOSIS — R87615 Unsatisfactory cytologic smear of cervix: Secondary | ICD-10-CM | POA: Diagnosis not present

## 2015-04-09 DIAGNOSIS — Z01419 Encounter for gynecological examination (general) (routine) without abnormal findings: Secondary | ICD-10-CM | POA: Diagnosis not present

## 2015-04-17 ENCOUNTER — Ambulatory Visit (INDEPENDENT_AMBULATORY_CARE_PROVIDER_SITE_OTHER): Payer: Medicare Other | Admitting: Family Medicine

## 2015-04-17 VITALS — BP 144/66 | HR 84 | Temp 97.9°F | Resp 18 | Ht 62.5 in | Wt 152.0 lb

## 2015-04-17 DIAGNOSIS — M1712 Unilateral primary osteoarthritis, left knee: Secondary | ICD-10-CM | POA: Diagnosis not present

## 2015-04-17 NOTE — Progress Notes (Signed)
This is an 82 year old woman who comes in with left knee pain. She's had this for 6 months. She's been to the orthopedic and had Synvisc injected as well as cortisone injections. She's had pain in the medial left knee as well as swelling and pain in the posterior (popliteal) part of her left knee.  She's been given a splint and brace but this has not helped. She's been offered prednisone as well as meloxicam, latter only get partial help in the former was not filled.  Objective:BP 144/66 mmHg  Pulse 84  Temp(Src) 97.9 F (36.6 C)  Resp 18  Ht 5' 2.5" (1.588 m)  Wt 152 lb (68.947 kg)  BMI 27.34 kg/m2  SpO2 97% Left knee shows moderate amount of synovial thickening over the medial aspect of the joint line. She does have a small palpable Baker cyst there. Her range of motion is normal. She has tenderness in the anterior joint line and medial joint line of the left knee.  After informed consent, patient was prepped with isopropyl alcohol and injected with 1 mL of Marcaine 0.5% and 1 mL of Depo-Medrol 80 mg.  A brace was applied to try to transfer some of the weight of her left knee onto the lateral joint line.  Assessment: Osteoarthritis of the left knee:  Plan: See if the injection and brace help, if not, call me and I will arrange to have a fitting for better protection of the knee.  Signed, Carola Frost.D.

## 2015-04-21 ENCOUNTER — Other Ambulatory Visit: Payer: Self-pay | Admitting: Internal Medicine

## 2015-04-22 ENCOUNTER — Other Ambulatory Visit: Payer: Self-pay | Admitting: Family Medicine

## 2015-04-23 NOTE — Telephone Encounter (Signed)
Do you want to RF? 

## 2015-04-27 ENCOUNTER — Other Ambulatory Visit: Payer: Self-pay | Admitting: Family Medicine

## 2015-04-27 DIAGNOSIS — M25562 Pain in left knee: Secondary | ICD-10-CM

## 2015-04-29 ENCOUNTER — Telehealth: Payer: Self-pay

## 2015-04-29 ENCOUNTER — Telehealth: Payer: Self-pay | Admitting: Internal Medicine

## 2015-04-29 ENCOUNTER — Ambulatory Visit (INDEPENDENT_AMBULATORY_CARE_PROVIDER_SITE_OTHER): Payer: Medicare Other | Admitting: Internal Medicine

## 2015-04-29 ENCOUNTER — Encounter: Payer: Self-pay | Admitting: Internal Medicine

## 2015-04-29 VITALS — BP 118/76 | HR 80 | Temp 98.2°F | Ht 64.0 in | Wt 152.0 lb

## 2015-04-29 DIAGNOSIS — J069 Acute upper respiratory infection, unspecified: Secondary | ICD-10-CM | POA: Insufficient documentation

## 2015-04-29 MED ORDER — CEFDINIR 300 MG PO CAPS: 300.0000 mg | ORAL_CAPSULE | Freq: Two times a day (BID) | ORAL | Status: DC

## 2015-04-29 MED ORDER — PROMETHAZINE-CODEINE 6.25-10 MG/5ML PO SYRP: 5.0000 mL | ORAL_SOLUTION | ORAL | Status: DC | PRN

## 2015-04-29 MED ORDER — BENZONATATE 200 MG PO CAPS: 200.0000 mg | ORAL_CAPSULE | Freq: Three times a day (TID) | ORAL | Status: DC | PRN

## 2015-04-29 NOTE — Telephone Encounter (Signed)
Pt's daughter called in wanting to know if you can fit her in. She has slight fever and pretty bad cough Please advise

## 2015-04-29 NOTE — Progress Notes (Signed)
Pre visit review using our clinic review tool, if applicable. No additional management support is needed unless otherwise documented below in the visit note. 

## 2015-04-29 NOTE — Assessment & Plan Note (Signed)
Prom-cod Tessalon prn  Cefdinir if worse

## 2015-04-29 NOTE — Progress Notes (Signed)
Subjective:  Patient ID: Anita Banks, female    DOB: August 14, 1927  Age: 82 y.o. MRN: [id number]  CC: Cough   HPI Anita Banks presents for cough and congestion x 2 days  Outpatient Prescriptions Prior to Visit  Medication Sig Dispense Refill  . aspirin 81 MG tablet Take 81 mg by mouth daily.    . calcium-vitamin D (OSCAL WITH D) 500-200 MG-UNIT per tablet Take 1 tablet by mouth daily. 90 tablet 3  . Cholecalciferol (VITAMIN D3) 1000 UNITS CAPS Take 1 capsule (1,000 Units total) by mouth daily. 90 capsule 3  . Coenzyme Q10 (CO Q-10) 100 MG CAPS Take 1 tablet by mouth daily. 90 each 3  . estradiol (ESTRACE) 0.1 MG/GM vaginal cream Place 2 g vaginally 2 (two) times a week.    Marland Kitchen glucosamine-chondroitin 500-400 MG tablet Take 1 tablet by mouth 2 (two) times daily. 180 tablet 3  . levothyroxine (SYNTHROID, LEVOTHROID) 112 MCG tablet TAKE 1 TABLET BY MOUTH EVERY DAY 30 tablet 3  . lisinopril (PRINIVIL,ZESTRIL) 10 MG tablet TAKE 1 TABLET BY MOUTH IN THE MORNING AND 2 TABLETS IN THE EVENING 270 tablet 3  . LORazepam (ATIVAN) 1 MG tablet TAKE 1 TABLET BY MOUTH AT BEDTIME MAY REPEAT DOSE ONE TIME IF NEEDED 60 tablet 5  . meloxicam (MOBIC) 7.5 MG tablet Take 1 tablet (7.5 mg total) by mouth daily. 10 tablet 3  . mirtazapine (REMERON) 15 MG tablet TAKE 1 TABLET (15 MG TOTAL) BY MOUTH AT BEDTIME. 90 tablet 0  . Multiple Vitamins-Minerals (CENTRUM SILVER ADULT 50+) TABS Take 1 tablet by mouth daily. 90 tablet 3  . Omega-3 Fatty Acids (FISH OIL) 1200 MG CAPS Take 1 capsule (1,200 mg total) by mouth daily. 90 capsule 3  . polyethylene glycol powder (GLYCOLAX/MIRALAX) powder MIX 1 SCOOP IN LIQUID AND TAKE ONCE DAILY. PATIENT NEEDS OFFICE VISIT FOR ADDITIONAL REFILLS 527 g 5  . topiramate (TOPAMAX) 25 MG tablet Take 25 mg by mouth 2 (two) times daily.    Marland Kitchen triamcinolone cream (KENALOG) 0.1 % Apply 1 application topically 2 (two) times daily. 160 g 3  . zolpidem (AMBIEN) 10 MG tablet TAKE 1  TABLET BY MOUTH EVERY DAY AS NEEDED FOR SLEEP 90 tablet 1  . chlorthalidone (HYGROTON) 25 MG tablet Take 1 tablet (25 mg total) by mouth daily. (Patient not taking: Reported on 04/29/2015) 90 tablet 1  . Docusate Calcium (STOOL SOFTENER PO) Take by mouth daily. Reported on 04/29/2015    . pantoprazole (PROTONIX) 40 MG tablet Take 1 tablet (40 mg total) by mouth daily. (Patient not taking: Reported on 04/29/2015) 30 tablet 11  . levothyroxine (SYNTHROID, LEVOTHROID) 112 MCG tablet Take 1 tablet (112 mcg total) by mouth daily before breakfast. PATIENT NEEDS AN OFFICE VISIT FOR ADDITIONAL REFILLS. 90 tablet 0  . polyethylene glycol powder (GLYCOLAX/MIRALAX) powder MIX 1 SCOOP IN LIQUID AND TAKE ONCE DAILY. PATIENT NEEDS OFFICE VISIT FOR ADDITIONAL REFILLS  5   No facility-administered medications prior to visit.    ROS Review of Systems  Constitutional: Positive for fatigue. Negative for chills, activity change, appetite change and unexpected weight change.  HENT: Positive for congestion and rhinorrhea. Negative for mouth sores and sinus pressure.   Eyes: Negative for visual disturbance.  Respiratory: Positive for cough. Negative for chest tightness.   Gastrointestinal: Negative for nausea and abdominal pain.  Genitourinary: Negative for frequency, difficulty urinating and vaginal pain.  Musculoskeletal: Negative for back pain and gait problem.  Skin: Negative for  pallor and rash.  Neurological: Negative for dizziness, tremors, weakness, numbness and headaches.  Psychiatric/Behavioral: Negative for confusion and sleep disturbance.    Objective:  BP 118/76 mmHg  Pulse 80  Temp(Src) 98.2 F (36.8 C) (Oral)  Ht 5\' 4"  (1.626 m)  Wt 152 lb (68.947 kg)  BMI 26.08 kg/m2  SpO2 98%  BP Readings from Last 3 Encounters:  04/29/15 118/76  04/17/15 144/66  01/18/15 120/80    Wt Readings from Last 3 Encounters:  04/29/15 152 lb (68.947 kg)  04/17/15 152 lb (68.947 kg)  01/18/15 148 lb (67.132  kg)    Physical Exam  Constitutional: She appears well-developed. No distress.  HENT:  Head: Normocephalic.  Right Ear: External ear normal.  Left Ear: External ear normal.  Nose: Nose normal.  Mouth/Throat: Oropharynx is clear and moist.  Eyes: Conjunctivae are normal. Pupils are equal, round, and reactive to light. Right eye exhibits no discharge. Left eye exhibits no discharge.  Neck: Normal range of motion. Neck supple. No JVD present. No tracheal deviation present. No thyromegaly present.  Cardiovascular: Normal rate, regular rhythm and normal heart sounds.   Pulmonary/Chest: No stridor. No respiratory distress. She has no wheezes.  Abdominal: Soft. Bowel sounds are normal. She exhibits no distension and no mass. There is no tenderness. There is no rebound and no guarding.  Musculoskeletal: She exhibits no edema or tenderness.  Lymphadenopathy:    She has no cervical adenopathy.  Neurological: She displays normal reflexes. No cranial nerve deficit. She exhibits normal muscle tone. Coordination normal.  Skin: No rash noted. No erythema.  Psychiatric: She has a normal mood and affect. Her behavior is normal. Judgment and thought content normal.    Lab Results  Component Value Date   WBC 6.2 09/04/2014   HGB 12.0 09/04/2014   HCT 34.9* 09/04/2014   PLT 285.0 09/04/2014   GLUCOSE 87 09/04/2014   CHOL 254* 11/28/2011   TRIG 257* 11/28/2011   HDL 50 11/28/2011   LDLDIRECT 186.4 02/20/2007   LDLCALC 153* 11/28/2011   ALT 13 05/06/2014   AST 18 05/06/2014   NA 137 09/04/2014   K 5.0 09/04/2014   CL 109 09/04/2014   CREATININE 1.58* 09/04/2014   BUN 32* 09/04/2014   CO2 21 09/04/2014   TSH 0.59 05/06/2014    Mm Digital Screening  01/21/2013  CLINICAL DATA:  Screening. EXAM: DIGITAL SCREENING BILATERAL MAMMOGRAM WITH CAD COMPARISON:  Previous exam(s). ACR Breast Density Category b: There are scattered areas of fibroglandular density. FINDINGS: There are no findings  suspicious for malignancy. Images were processed with CAD. IMPRESSION: No mammographic evidence of malignancy. A result letter of this screening mammogram will be mailed directly to the patient. RECOMMENDATION: Screening mammogram in one year. (Code:SM-B-01Y) BI-RADS CATEGORY  1: Negative Electronically Signed   By: Everlean Alstrom M.D.   On: 01/21/2013 14:53    Assessment & Plan:   There are no diagnoses linked to this encounter. I am having Ms. Eschbach maintain her estradiol, Docusate Calcium (STOOL SOFTENER PO), calcium-vitamin D, Vitamin D3, CENTRUM SILVER ADULT 50+, glucosamine-chondroitin, Co Q-10, Fish Oil, chlorthalidone, topiramate, zolpidem, pantoprazole, aspirin, meloxicam, polyethylene glycol powder, triamcinolone cream, lisinopril, LORazepam, levothyroxine, and mirtazapine.  No orders of the defined types were placed in this encounter.     Follow-up: No Follow-up on file.  Walker Kehr, MD

## 2015-04-29 NOTE — Telephone Encounter (Signed)
Pharmacy called stating pt's daughter refused Omnicef ABX due to PCN allergy. Per pharmacist and AVP, there is a lower than 5% chance of developing allergy to medication like Omnicef with PCN allergy.  Pharmacist agreed and will advise pt and daughter of same.

## 2015-04-29 NOTE — Telephone Encounter (Signed)
Ok 1:15 today Thx

## 2015-04-29 NOTE — Telephone Encounter (Signed)
appt made

## 2015-05-02 ENCOUNTER — Ambulatory Visit (INDEPENDENT_AMBULATORY_CARE_PROVIDER_SITE_OTHER): Payer: Medicare Other | Admitting: Family Medicine

## 2015-05-02 ENCOUNTER — Other Ambulatory Visit: Payer: Self-pay | Admitting: Family Medicine

## 2015-05-02 VITALS — BP 132/82 | HR 89 | Temp 98.1°F | Resp 18 | Wt 151.0 lb

## 2015-05-02 DIAGNOSIS — R05 Cough: Secondary | ICD-10-CM | POA: Diagnosis not present

## 2015-05-02 DIAGNOSIS — R059 Cough, unspecified: Secondary | ICD-10-CM

## 2015-05-02 DIAGNOSIS — R509 Fever, unspecified: Secondary | ICD-10-CM

## 2015-05-02 LAB — POCT CBC
Granulocyte percent: 72.8 %G (ref 37–80)
HCT, POC: 32.1 % — AB (ref 37.7–47.9)
Hemoglobin: 11.6 g/dL — AB (ref 12.2–16.2)
Lymph, poc: 1.7 (ref 0.6–3.4)
MCH, POC: 30.5 pg (ref 27–31.2)
MCHC: 36 g/dL — AB (ref 31.8–35.4)
MCV: 84.8 fL (ref 80–97)
MID (cbc): 0.7 (ref 0–0.9)
MPV: 7.1 fL (ref 0–99.8)
POC Granulocyte: 6.5 (ref 2–6.9)
POC LYMPH PERCENT: 19.5 %L (ref 10–50)
POC MID %: 7.7 %M (ref 0–12)
Platelet Count, POC: 269 10*3/uL (ref 142–424)
RBC: 3.78 M/uL — AB (ref 4.04–5.48)
RDW, POC: 13.5 %
WBC: 8.9 10*3/uL (ref 4.6–10.2)

## 2015-05-02 MED ORDER — OSELTAMIVIR PHOSPHATE 75 MG PO CAPS: 75.0000 mg | ORAL_CAPSULE | Freq: Two times a day (BID) | ORAL | Status: DC

## 2015-05-02 MED ORDER — LEVOFLOXACIN 500 MG PO TABS: 500.0000 mg | ORAL_TABLET | Freq: Every day | ORAL | Status: DC

## 2015-05-02 MED ORDER — PROMETHAZINE-CODEINE 6.25-10 MG/5ML PO SYRP: 5.0000 mL | ORAL_SOLUTION | ORAL | Status: DC | PRN

## 2015-05-02 NOTE — Progress Notes (Addendum)
By signing my name below, I, Moises Blood, attest that this documentation has been prepared under the direction and in the presence of Robyn Haber, MD. Electronically Signed: Moises Blood, [place]. 05/02/2015 , 8:24 AM .  Patient was seen in room 11 .   Patient ID: Anita Banks MRN: [id number], DOB: 1927/08/07, 82 y.o. Date of Encounter: 05/02/2015  Primary Physician: Walker Kehr, MD  Chief Complaint:  Chief Complaint  Patient presents with   Nasal Congestion    4-5 days ago, getting worse   Cough    since yesterday    HPI:  Anita Banks is a 82 y.o. female who presents to [facility] complaining of nasal congestion that started 5 days ago. She went to her PCP (Dr. Walker Kehr, MD) and was given medications. Patient's daughter looked up the medications because the patient is allergic to penicillin (reaction: severe swelling) and other side effects of the medication. So, the daughter didn't give the patient any medications.   Her illness is getting worse and is now progressively getting worse with a cough. She has a relatively high fever to her regular temperature. She received a flu shot this year. She denies ear pain or chest tightness.   Past Medical History  Diagnosis Date   Hypertension    Anxiety    Migraine    Arthritis      Home Meds: Prior to Admission medications   Medication Sig Start Date End Date Taking? Authorizing Provider  aspirin 81 MG tablet Take 81 mg by mouth daily.    Historical Provider, MD  benzonatate (TESSALON) 200 MG capsule Take 1 capsule (200 mg total) by mouth 3 (three) times daily as needed for cough. 04/29/15   Aleksei Plotnikov V, MD  calcium-vitamin D (OSCAL WITH D) 500-200 MG-UNIT per tablet Take 1 tablet by mouth daily. 06/28/11   Robyn Haber, MD  cefdinir (OMNICEF) 300 MG capsule Take 1 capsule (300 mg total) by mouth 2 (two) times daily. 04/29/15   Aleksei Plotnikov V, MD  chlorthalidone  (HYGROTON) 25 MG tablet Take 1 tablet (25 mg total) by mouth daily. Patient not taking: Reported on 04/29/2015 05/15/13   Robyn Haber, MD  Cholecalciferol (VITAMIN D3) 1000 UNITS CAPS Take 1 capsule (1,000 Units total) by mouth daily. 01/09/13   Robyn Haber, MD  Coenzyme Q10 (CO Q-10) 100 MG CAPS Take 1 tablet by mouth daily. 01/09/13   Robyn Haber, MD  Docusate Calcium (STOOL SOFTENER PO) Take by mouth daily. Reported on 04/29/2015    Historical Provider, MD  estradiol (ESTRACE) 0.1 MG/GM vaginal cream Place 2 g vaginally 2 (two) times a week.    Historical Provider, MD  glucosamine-chondroitin 500-400 MG tablet Take 1 tablet by mouth 2 (two) times daily. 01/09/13   Robyn Haber, MD  levothyroxine (SYNTHROID, LEVOTHROID) 112 MCG tablet TAKE 1 TABLET BY MOUTH EVERY DAY 04/21/15   Aleksei Plotnikov V, MD  lisinopril (PRINIVIL,ZESTRIL) 10 MG tablet TAKE 1 TABLET BY MOUTH IN THE MORNING AND 2 TABLETS IN THE EVENING 02/04/15   Aleksei Plotnikov V, MD  LORazepam (ATIVAN) 1 MG tablet TAKE 1 TABLET BY MOUTH AT BEDTIME MAY REPEAT DOSE ONE TIME IF NEEDED 02/16/15   Robyn Haber, MD  meloxicam (MOBIC) 7.5 MG tablet Take 1 tablet (7.5 mg total) by mouth daily. 09/30/14   Robyn Haber, MD  mirtazapine (REMERON) 15 MG tablet TAKE 1 TABLET (15 MG TOTAL) BY MOUTH AT BEDTIME. 04/23/15   Robyn Haber, MD  Multiple Vitamins-Minerals (CENTRUM SILVER ADULT 50+) TABS Take 1 tablet by mouth daily. 01/09/13   Robyn Haber, MD  Omega-3 Fatty Acids (FISH OIL) 1200 MG CAPS Take 1 capsule (1,200 mg total) by mouth daily. 01/09/13   Robyn Haber, MD  pantoprazole (PROTONIX) 40 MG tablet Take 1 tablet (40 mg total) by mouth daily. Patient not taking: Reported on 04/29/2015 05/06/14   Aleksei Plotnikov V, MD  polyethylene glycol powder (GLYCOLAX/MIRALAX) powder MIX 1 SCOOP IN LIQUID AND TAKE ONCE DAILY. PATIENT NEEDS OFFICE VISIT FOR ADDITIONAL REFILLS 11/19/14   [name], MD  promethazine-codeine  (PHENERGAN WITH CODEINE) 6.25-10 MG/5ML syrup Take 5 mLs by mouth every 4 (four) hours as needed. 04/29/15   Aleksei Plotnikov V, MD  topiramate (TOPAMAX) 25 MG tablet Take 25 mg by mouth 2 (two) times daily.    Historical Provider, MD  triamcinolone cream (KENALOG) 0.1 % Apply 1 application topically 2 (two) times daily. 01/18/15   Aleksei Plotnikov V, MD  zolpidem (AMBIEN) 10 MG tablet TAKE 1 TABLET BY MOUTH EVERY DAY AS NEEDED FOR SLEEP 02/09/14   Robyn Haber, MD    Allergies:  Allergies  Allergen Reactions   Amitriptyline Hcl    Erythromycin    Metronidazole    Penicillins    Rizatriptan Benzoate    Rosuvastatin    Tetracyclines & Related     Social History   Social History   Marital Status: Married    Spouse Name: N/A   Number of Children: N/A   Years of Education: N/A   Occupational History   Not on file.   Social History Main Topics   Smoking status: Never Smoker    Smokeless tobacco: Not on file   Alcohol Use: Not on file   Drug Use: Not on file   Sexual Activity: Not on file   Other Topics Concern   Not on file   Social History Narrative     Review of Systems: Constitutional: negative for fever, chills, night sweats, weight changes; positive for fatigue  HEENT: negative for vision changes, hearing loss, rhinorrhea, ST, epistaxis, or sinus pressure, ear pain; positive for congestion Cardiovascular: negative for chest pain or palpitations, chest tightness;  Respiratory: negative for hemoptysis, wheezing, shortness of breath; positive for cough Abdominal: negative for abdominal pain, nausea, vomiting, diarrhea, or constipation Dermatological: negative for rash Neurologic: negative for headache, dizziness, or syncope All other systems reviewed and are otherwise negative with the exception to those above and in the HPI.  Physical Exam: Blood pressure 132/82, pulse 89, temperature 98.1 F (36.7 C), resp. rate 18, weight 151 lb (68.493 kg),  SpO2 95 %., Body mass index is 25.91 kg/(m^2). General: Well developed, well nourished, in no acute distress. appears tired and acutely ill Head: Normocephalic, atraumatic, eyes without discharge, sclera non-icteric, nares are without discharge. Bilateral auditory canals clear, TM's are without perforation, pearly grey and translucent with reflective cone of light bilaterally. Oral cavity moist, posterior pharynx without exudate, erythema, peritonsillar abscess, or post nasal drip.  Neck: Supple. No thyromegaly. Full ROM. No lymphadenopathy. Lungs: Clear bilaterally to auscultation without wheezes, rales, or rhonchi. Breathing is unlabored. coarse breath sounds Heart: RRR with S1 S2. No murmurs, rubs, or gallops appreciated. Abdomen: Soft, non-tender, non-distended with normoactive bowel sounds. No hepatomegaly. No rebound/guarding. No obvious abdominal masses. Msk:  Strength and tone normal for age. Extremities/Skin: Warm and dry. No clubbing or cyanosis. No edema. No rashes or suspicious lesions. Neuro: Alert and oriented X 3. Moves all  extremities spontaneously. Gait is normal. CNII-XII grossly in tact. Psych:  Responds to questions appropriately with a normal affect.   Labs: Results for orders placed or performed in visit on 05/02/15  POCT CBC  Result Value Ref Range   WBC 8.9 4.6 - 10.2 K/uL   Lymph, poc 1.7 0.6 - 3.4   POC LYMPH PERCENT 19.5 10 - 50 %L   MID (cbc) 0.7 0 - 0.9   POC MID % 7.7 0 - 12 %M   POC Granulocyte 6.5 2 - 6.9   Granulocyte percent 72.8 37 - 80 %G   RBC 3.78 (A) 4.04 - 5.48 M/uL   Hemoglobin 11.6 (A) 12.2 - 16.2 g/dL   HCT, POC 32.1 (A) 37.7 - 47.9 %   MCV 84.8 80 - 97 fL   MCH, POC 30.5 27 - 31.2 pg   MCHC 36.0 (A) 31.8 - 35.4 g/dL   RDW, POC 13.5 %   Platelet Count, POC 269 142 - 424 K/uL   MPV 7.1 0 - 99.8 fL     ASSESSMENT AND PLAN:  82 y.o. year old female with Symptoms suspicious for influenza. Cough - Plan: POCT CBC, promethazine-codeine  (PHENERGAN WITH CODEINE) 6.25-10 MG/5ML syrup, oseltamivir (TAMIFLU) 75 MG capsule  Low grade fever - Plan: POCT CBC, promethazine-codeine (PHENERGAN WITH CODEINE) 6.25-10 MG/5ML syrup, oseltamivir (TAMIFLU) 75 MG capsule  This chart was scribed in my presence and reviewed by me personally.     Signed, Robyn Haber, MD 05/02/2015 8:24 AM

## 2015-05-02 NOTE — Patient Instructions (Addendum)
????? ? ???????? (  Influenza, Adult) ????? - ??? ???????? ??????????? ??????????? ?????. ???????? ?????? ????????? ? ?????? ??????, ?.?. ? ???? ????? ???? ???? ?????? ??????? ???????? ? ????? ?????? ????????. ??????? ??????? ????? ??????????? ???? ????? ?????. ????? ????? ????? ???????????? ?? ???????? ???????? (???????). ???????  ????? ?????????? ???????, ??????? ???????? ??????????? ????. ????? ?????????? ????????-????????? ?????. ?? ????? ?????? ??????????, ????????????? ? ??????-?????? ????????, ?? ??????????? ???????? ????????? ????? ?????, ? ????? ????????????? ?? ???, ???? ??? ??????. ????? ? ?????????? ?? ?????????? ????? ????? ???????? ??????? ???????, ???? ?? ??????, ??????? ???????? ????????, ?????? ??????????? ??????????? ?????? (????????, ????????? ???????????????) ??? ?????? (????????, ???????????? ?????), ??? ???? ? ??? ????????? ???????? ???????. ??????? ???? ? ?????????? ??????? ????? ?????????? ????? ???????? ??????? ???????. ???????? ???????????????? ?????????? ?????? - ???????????? ??????????? ?????? (?????????). ?????? ???????? ?????????? ????? ????????? ?????????? ??????????? ?????? ? ??????? ?????? ????? ????????. ???????? ? ????????  ???????? ?????? ???????????? 4-10 ???? ? ????? ????????:  ????????? ??????????? ????.  ?????.  ???????? ????, ???? ?? ????? ????, ???????? ????.  ???? ? ?????.  ???????? ??????????? ? ????? ? ??????.  ?????? ???????.  ???????? ??? ???????? ?????????.  ??????????????.  ??????? ??? ?????. ???????  ??????? ?????? ?? ????????? ?????? ???????? ? ?????????? ???????. ??? ????????????? ???????? ????? ???? ???????? ?????? ?????? ?? ??????????. ???????  ?????? ????? ???????? ??????????????. ??????? ?????????? ?? ?????????? ?????????. ? ????? ??????? ??????? ??? ??????? ???? ????? ???????? ?????????, ????? ????????? ????? ???????. ????? ???????????? ?? ???? ???????, ?.?. ??????????? ??????? ???????, ? ?? ??????????. ?????????? ?? ????? ?  ???????? ????????  ?????????? ????????????? ????????? ?????? ? ???????????? ? ?????????? ?????.  ??? ?????????? ??????? ?????????? ? ????????? ????????? ????????? ????.  ????? ?????????, ???? ??????????? ?? ???????????????. ?? ??? ?????? ?????? ?? 3 ?? 4 ????.  ????? ?????, ????? ???? ???? ?????????? ? ?????????? ??? ??????-??????? ?????.  ????? ???????? ??? ???????, ??????????? ??? ? ????? ????, ????? ?? ????????? ??????????????? ????????.  ?????????? ????? ?????????? ? ?? ?????? ?? ?????? ??? ? ?????, ???? ??????????? ?? ????? ????????? ? ????? ?? ?????????? 1 ?????? ?????. ????????????  ????????? ?????????? (???????? ?? ??????) ???????? ????????? ???????? ???????????? ??????. ? ??? ????????? ???????? ?? ?????? ????????????? ???? ????????. ?????????? ? ?????, ????:  ?? ??????????? ???? ? ?????, ?????? ?????????? ??? ????? ?????????? ?????? ?????.  ? ??? ???????? ???????, ????? ??? ??????.  ????????? ????????? ??? ?????????. ?????????? ?????????? ? ?????, ????:  ? ??? ????????? ???????????? ???????, ?? ????? ??????????, ??? ????/????? ??????????? ????????? ???????.  ? ??? ????????? ?????? ???? ??? ??????????????? ? ??????? ???.  ? ??? ????????? ????????? ???????? ????, ???? ? ??????? ???? ??? ???.  ? ??? ???????? ???????????????? ??????? ??? ?????. ?????????, ??? ??:   ????????? ?????? ??????????.  ?????? ?????????????? ??????? ?? ????? ??????????.  ??????????????? ?????????? ? ?????, ???? ??? ?? ?????????? ????? ??? ?????????? ????.   ??? ?????????? ?? ????? ???????? ??????, ??????????????? ????? ??????. ??????????? ???????? ????? ???????????? ??? ??????? ? ????? ??????? ??????.   Document Released: 01/23/2005 Document Revised: 02/13/2014 Elsevier Interactive Patient Education [employer].

## 2015-05-02 NOTE — Addendum Note (Signed)
Addended by: Robyn Haber on: 05/02/2015 04:46 PM   Modules accepted: Orders

## 2015-05-05 ENCOUNTER — Telehealth: Payer: Self-pay | Admitting: Radiology

## 2015-05-05 NOTE — Telephone Encounter (Signed)
Wants to come in tomorrow she was better yesterday but fever today. Encouraged Tylenol or Ibuprofen for the fever. I provided reassurance that this is not uncommon with the flu. Discussed with Dr Joseph Art

## 2015-05-08 ENCOUNTER — Ambulatory Visit (INDEPENDENT_AMBULATORY_CARE_PROVIDER_SITE_OTHER): Payer: Medicare Other | Admitting: Family Medicine

## 2015-05-08 ENCOUNTER — Ambulatory Visit (INDEPENDENT_AMBULATORY_CARE_PROVIDER_SITE_OTHER): Payer: Medicare Other

## 2015-05-08 VITALS — BP 118/70 | HR 87 | Temp 98.8°F | Resp 17 | Ht 63.0 in | Wt 153.0 lb

## 2015-05-08 DIAGNOSIS — J111 Influenza due to unidentified influenza virus with other respiratory manifestations: Secondary | ICD-10-CM | POA: Diagnosis not present

## 2015-05-08 DIAGNOSIS — R509 Fever, unspecified: Secondary | ICD-10-CM

## 2015-05-08 DIAGNOSIS — R05 Cough: Secondary | ICD-10-CM

## 2015-05-08 DIAGNOSIS — R059 Cough, unspecified: Secondary | ICD-10-CM

## 2015-05-08 LAB — COMPLETE METABOLIC PANEL WITH GFR
ALT: 9 U/L (ref 6–29)
AST: 17 U/L (ref 10–35)
Albumin: 3.6 g/dL (ref 3.6–5.1)
Alkaline Phosphatase: 66 U/L (ref 33–130)
BUN: 29 mg/dL — ABNORMAL HIGH (ref 7–25)
CO2: 20 mmol/L (ref 20–31)
Calcium: 8.9 mg/dL (ref 8.6–10.4)
Chloride: 102 mmol/L (ref 98–110)
Creat: 1.7 mg/dL — ABNORMAL HIGH (ref 0.60–0.88)
GFR, Est African American: 31 mL/min — ABNORMAL LOW (ref >=60)
GFR, Est Non African American: 27 mL/min — ABNORMAL LOW (ref >=60)
Glucose, Bld: 97 mg/dL (ref 65–99)
Potassium: 4.1 mmol/L (ref 3.5–5.3)
Sodium: 134 mmol/L — ABNORMAL LOW (ref 135–146)
Total Bilirubin: 0.2 mg/dL (ref 0.2–1.2)
Total Protein: 6.8 g/dL (ref 6.1–8.1)

## 2015-05-08 LAB — POCT CBC
Granulocyte percent: 64.8 %G (ref 37–80)
HCT, POC: 31.5 % — AB (ref 37.7–47.9)
Hemoglobin: 11.5 g/dL — AB (ref 12.2–16.2)
Lymph, poc: 1.5 (ref 0.6–3.4)
MCH, POC: 30.1 pg (ref 27–31.2)
MCHC: 36.4 g/dL — AB (ref 31.8–35.4)
MCV: 82.8 fL (ref 80–97)
MID (cbc): 0.6 (ref 0–0.9)
MPV: 7 fL (ref 0–99.8)
POC Granulocyte: 3.8 (ref 2–6.9)
POC LYMPH PERCENT: 25.1 %L (ref 10–50)
POC MID %: 10.1 %M (ref 0–12)
Platelet Count, POC: 264 10*3/uL (ref 142–424)
RBC: 3.81 M/uL — AB (ref 4.04–5.48)
RDW, POC: 13.2 %
WBC: 5.8 10*3/uL (ref 4.6–10.2)

## 2015-05-08 NOTE — Patient Instructions (Addendum)
The lab tests and x-ray are reassuring. This a viral illness and I expect another for 5 days, possibly week to clear up completely.    I want you to start taking ensure or boost get your strength back. You may take the Advil or Tylenol to help control the evening fever.  At this point antibiotics and viral medicine won't help. System matter of convalescing. It does help to include soup and broth to help with his strength.

## 2015-05-08 NOTE — Progress Notes (Signed)
This is an 82 year old woman from [place] who is accompanied by her daughter and son-in-law today. She's been suffering from cough, low-grade fever, exhaustion, and mild sore throat for over a week.  When she was first seen, she was started on Tamiflu and Levaquin. She is finished the Tamiflu and today took her last Levaquin. She still running low-grade temperatures of 99 200 at night and she feels tired. Yesterday her appetite was better for the first time.  She feels weak and needed a wheelchair to get into the office.  Objective: Elderly woman who appears haggard but is alert and cooperative BP 118/70 mmHg  Pulse 87  Temp(Src) 98.8 F (37.1 C) (Oral)  Resp 17  Ht 5\' 3"  (1.6 m)  Wt 153 lb (69.4 kg)  BMI 27.11 kg/m2  SpO2 98% HEENT: Unremarkable-throat is clear Neck: Supple no adenopathy Chest: Few bibasilar rales, no rhonchi or wheezes Heart: Regular without significant murmur Abdomen: Soft and nontender Skin: Pale  Results for orders placed or performed in visit on 05/08/15  POCT CBC  Result Value Ref Range   WBC 5.8 4.6 - 10.2 K/uL   Lymph, poc 1.5 0.6 - 3.4   POC LYMPH PERCENT 25.1 10 - 50 %L   MID (cbc) 0.6 0 - 0.9   POC MID % 10.1 0 - 12 %M   POC Granulocyte 3.8 2 - 6.9   Granulocyte percent 64.8 37 - 80 %G   RBC 3.81 (A) 4.04 - 5.48 M/uL   Hemoglobin 11.5 (A) 12.2 - 16.2 g/dL   HCT, POC 31.5 (A) 37.7 - 47.9 %   MCV 82.8 80 - 97 fL   MCH, POC 30.1 27 - 31.2 pg   MCHC 36.4 (A) 31.8 - 35.4 g/dL   RDW, POC 13.2 %   Platelet Count, POC 264 142 - 424 K/uL   MPV 7.0 0 - 99.8 fL   Chest x-ray shows moderate amount of fluid in the transverse fissure of the right lung. There is no infiltrate.  Assessment: Prolonged viral syndrome. No need for further antibiotics.  Plan: Patient may take Advil or Tylenol keep the fever from becoming too uncomfortable. I wonder start taking Ensure or boost to try to build up her strength.  I try to be as reassuring as I can that this is a  viral illness and will take another 45 possibly 7 days to clear  Signed, Carola Frost.D.

## 2015-05-14 DIAGNOSIS — N814 Uterovaginal prolapse, unspecified: Secondary | ICD-10-CM | POA: Diagnosis not present

## 2015-05-18 ENCOUNTER — Ambulatory Visit (INDEPENDENT_AMBULATORY_CARE_PROVIDER_SITE_OTHER): Payer: Medicare Other | Admitting: Sports Medicine

## 2015-05-18 ENCOUNTER — Encounter: Payer: Self-pay | Admitting: Sports Medicine

## 2015-05-18 VITALS — BP 120/63 | HR 67 | Ht 64.0 in | Wt 152.0 lb

## 2015-05-18 DIAGNOSIS — M1712 Unilateral primary osteoarthritis, left knee: Secondary | ICD-10-CM

## 2015-05-18 MED ORDER — TRAMADOL HCL 50 MG PO TABS: 50.0000 mg | ORAL_TABLET | Freq: Two times a day (BID) | ORAL | Status: DC | PRN

## 2015-05-18 NOTE — Assessment & Plan Note (Signed)
While she has bilat OA left is one giving her sxs  I suggested some easy HEP  We will consider work with PT  Cont with brace  See me in 2 mos and possibly repeat CSI Baker's cyst not impressive today

## 2015-05-18 NOTE — Progress Notes (Signed)
Patient ID: Anita Banks, female   DOB: March 11, 1927, 82 y.o.   MRN: [id number]   Interpreter is Adalberto Ill. Accompanied also by children.  HPI: 76 yo woman with about 13 years of left knee pain following nonspecific injury/sprain. Has had pain off and on, but now worse over pat 6-12 months. Seen at another practice and has gotten CSI about every 6 months for several years with good relief. Last injection was last month but it didn't help as much. Told she has a Baker's cyst on [place], but not big enough to drain.  She denies falls, swelling, catching/locking, and instability. Wears external hard brace over sleeve which helps some with pain and walking. Does not take pain medication, does bends/stretches a few times per week for exercise. Climbing stairs and bending knee makes it worse. No other joint or physical complaints today.  Dr Joseph Art has helped her with CSI in past  Recently seen at [facility] with CSI 1 mo ago and Timmothy Sours Joy lateral stay brace/  This gave brief relief but brace does help  ROS: No fever/chills, generalized weakness, numbness or tingling. No other complaints.  PMH: Depression (lorazepam and mirtazapine) HTN  PE: BP 120/63 mmHg  Pulse 67  Ht 5\' 4"  (1.626 m)  Wt 152 lb (68.947 kg)  BMI 26.08 kg/m2  GEN: WDWN, elderly woman, does not speak [place], but pleasant and conversant with translators. CV/RESP: Nonlabored respirations, peripheral circulation grossly intact MSK: Left knee warm to touch (though just took sleeve and brace off). No erythema. Mild posterior fluid collection and suprapatellar effusion. Mild crepitus. TTP over medial/anterior joint line. Pain with flexion which is reduced to ~ 110 deg vs 125 deg on RT/  Extension reduced by ~10 degrees. Antalgic gait shoes left lateral foot deviation in swing phase. Left lateral/medial ligament provocative tests negative, negative anterior/posterior ligament tests.   A/P:  Known osteoarthritis on xray with history of  Baker's cyst. Recommend conservative management with exercises demonstrated and pain control with Tramadol. No indication to repeat injection at this time, but will reevaluate with ultrasound at next visit with new or worsening symptoms or fluid collection. Encouraged staying active and considering program like [facility] Sne[facility] at the [facility], to both help with knee pain and depression. May continue to use brace and topical patches if helpful.

## 2015-05-19 ENCOUNTER — Ambulatory Visit: Payer: Medicare Other | Admitting: Family Medicine

## 2015-06-15 DIAGNOSIS — R51 Headache: Secondary | ICD-10-CM | POA: Diagnosis not present

## 2015-06-15 DIAGNOSIS — G44229 Chronic tension-type headache, not intractable: Secondary | ICD-10-CM | POA: Diagnosis not present

## 2015-06-16 ENCOUNTER — Other Ambulatory Visit: Payer: Self-pay | Admitting: Internal Medicine

## 2015-06-23 DIAGNOSIS — M1712 Unilateral primary osteoarthritis, left knee: Secondary | ICD-10-CM | POA: Diagnosis not present

## 2015-06-24 DIAGNOSIS — R262 Difficulty in walking, not elsewhere classified: Secondary | ICD-10-CM | POA: Diagnosis not present

## 2015-06-24 DIAGNOSIS — M25562 Pain in left knee: Secondary | ICD-10-CM | POA: Diagnosis not present

## 2015-06-24 DIAGNOSIS — M1712 Unilateral primary osteoarthritis, left knee: Secondary | ICD-10-CM | POA: Diagnosis not present

## 2015-06-30 DIAGNOSIS — M25562 Pain in left knee: Secondary | ICD-10-CM | POA: Diagnosis not present

## 2015-06-30 DIAGNOSIS — R2689 Other abnormalities of gait and mobility: Secondary | ICD-10-CM | POA: Diagnosis not present

## 2015-06-30 DIAGNOSIS — M1712 Unilateral primary osteoarthritis, left knee: Secondary | ICD-10-CM | POA: Diagnosis not present

## 2015-07-06 DIAGNOSIS — M1712 Unilateral primary osteoarthritis, left knee: Secondary | ICD-10-CM | POA: Diagnosis not present

## 2015-07-06 DIAGNOSIS — M25562 Pain in left knee: Secondary | ICD-10-CM | POA: Diagnosis not present

## 2015-07-14 DIAGNOSIS — R2689 Other abnormalities of gait and mobility: Secondary | ICD-10-CM | POA: Diagnosis not present

## 2015-07-14 DIAGNOSIS — M170 Bilateral primary osteoarthritis of knee: Secondary | ICD-10-CM | POA: Diagnosis not present

## 2015-07-14 DIAGNOSIS — M25561 Pain in right knee: Secondary | ICD-10-CM | POA: Diagnosis not present

## 2015-07-14 DIAGNOSIS — M25562 Pain in left knee: Secondary | ICD-10-CM | POA: Diagnosis not present

## 2015-07-17 ENCOUNTER — Encounter: Payer: Self-pay | Admitting: Internal Medicine

## 2015-07-20 DIAGNOSIS — M1712 Unilateral primary osteoarthritis, left knee: Secondary | ICD-10-CM | POA: Diagnosis not present

## 2015-07-22 DIAGNOSIS — M25562 Pain in left knee: Secondary | ICD-10-CM | POA: Diagnosis not present

## 2015-07-22 DIAGNOSIS — M1712 Unilateral primary osteoarthritis, left knee: Secondary | ICD-10-CM | POA: Diagnosis not present

## 2015-07-28 DIAGNOSIS — M1712 Unilateral primary osteoarthritis, left knee: Secondary | ICD-10-CM | POA: Diagnosis not present

## 2015-07-28 DIAGNOSIS — R2689 Other abnormalities of gait and mobility: Secondary | ICD-10-CM | POA: Diagnosis not present

## 2015-07-28 DIAGNOSIS — M25562 Pain in left knee: Secondary | ICD-10-CM | POA: Diagnosis not present

## 2015-07-31 ENCOUNTER — Other Ambulatory Visit: Payer: Self-pay | Admitting: Family Medicine

## 2015-08-09 ENCOUNTER — Other Ambulatory Visit: Payer: Self-pay | Admitting: *Deleted

## 2015-08-09 MED ORDER — LEVOTHYROXINE SODIUM 112 MCG PO TABS: 112.0000 ug | ORAL_TABLET | Freq: Every day | ORAL | Status: DC

## 2015-08-11 DIAGNOSIS — R2689 Other abnormalities of gait and mobility: Secondary | ICD-10-CM | POA: Diagnosis not present

## 2015-08-11 DIAGNOSIS — M25561 Pain in right knee: Secondary | ICD-10-CM | POA: Diagnosis not present

## 2015-08-11 DIAGNOSIS — M170 Bilateral primary osteoarthritis of knee: Secondary | ICD-10-CM | POA: Diagnosis not present

## 2015-08-11 DIAGNOSIS — M25562 Pain in left knee: Secondary | ICD-10-CM | POA: Diagnosis not present

## 2015-08-12 DIAGNOSIS — M1712 Unilateral primary osteoarthritis, left knee: Secondary | ICD-10-CM | POA: Diagnosis not present

## 2015-08-13 DIAGNOSIS — N816 Rectocele: Secondary | ICD-10-CM | POA: Diagnosis not present

## 2015-08-13 DIAGNOSIS — N8111 Cystocele, midline: Secondary | ICD-10-CM | POA: Diagnosis not present

## 2015-08-18 DIAGNOSIS — R2689 Other abnormalities of gait and mobility: Secondary | ICD-10-CM | POA: Diagnosis not present

## 2015-08-18 DIAGNOSIS — M1712 Unilateral primary osteoarthritis, left knee: Secondary | ICD-10-CM | POA: Diagnosis not present

## 2015-08-18 DIAGNOSIS — M25562 Pain in left knee: Secondary | ICD-10-CM | POA: Diagnosis not present

## 2015-08-20 ENCOUNTER — Other Ambulatory Visit: Payer: Self-pay | Admitting: Internal Medicine

## 2015-08-20 ENCOUNTER — Encounter: Payer: Self-pay | Admitting: Internal Medicine

## 2015-08-20 MED ORDER — POLYETHYLENE GLYCOL 3350 17 GM/SCOOP PO POWD: ORAL | Status: DC

## 2015-08-25 DIAGNOSIS — M1712 Unilateral primary osteoarthritis, left knee: Secondary | ICD-10-CM | POA: Diagnosis not present

## 2015-08-25 DIAGNOSIS — R2689 Other abnormalities of gait and mobility: Secondary | ICD-10-CM | POA: Diagnosis not present

## 2015-08-25 DIAGNOSIS — M25562 Pain in left knee: Secondary | ICD-10-CM | POA: Diagnosis not present

## 2015-09-01 DIAGNOSIS — R2689 Other abnormalities of gait and mobility: Secondary | ICD-10-CM | POA: Diagnosis not present

## 2015-09-01 DIAGNOSIS — M25562 Pain in left knee: Secondary | ICD-10-CM | POA: Diagnosis not present

## 2015-09-01 DIAGNOSIS — M1712 Unilateral primary osteoarthritis, left knee: Secondary | ICD-10-CM | POA: Diagnosis not present

## 2015-09-09 DIAGNOSIS — M25562 Pain in left knee: Secondary | ICD-10-CM | POA: Diagnosis not present

## 2015-09-09 DIAGNOSIS — M1712 Unilateral primary osteoarthritis, left knee: Secondary | ICD-10-CM | POA: Diagnosis not present

## 2015-09-09 DIAGNOSIS — R2689 Other abnormalities of gait and mobility: Secondary | ICD-10-CM | POA: Diagnosis not present

## 2015-09-10 ENCOUNTER — Ambulatory Visit (INDEPENDENT_AMBULATORY_CARE_PROVIDER_SITE_OTHER): Payer: Medicare Other | Admitting: Internal Medicine

## 2015-09-10 ENCOUNTER — Encounter: Payer: Self-pay | Admitting: Internal Medicine

## 2015-09-10 DIAGNOSIS — M1712 Unilateral primary osteoarthritis, left knee: Secondary | ICD-10-CM

## 2015-09-10 DIAGNOSIS — R0789 Other chest pain: Secondary | ICD-10-CM

## 2015-09-10 DIAGNOSIS — E034 Atrophy of thyroid (acquired): Secondary | ICD-10-CM

## 2015-09-10 DIAGNOSIS — E038 Other specified hypothyroidism: Secondary | ICD-10-CM | POA: Diagnosis not present

## 2015-09-10 DIAGNOSIS — G4700 Insomnia, unspecified: Secondary | ICD-10-CM

## 2015-09-10 MED ORDER — POLYETHYLENE GLYCOL 3350 17 GM/SCOOP PO POWD: ORAL | 5 refills | Status: DC

## 2015-09-10 NOTE — Patient Instructions (Signed)
GERD wedge pillow 

## 2015-09-10 NOTE — Progress Notes (Signed)
Pre visit review using our clinic review tool, if applicable. No additional management support is needed unless otherwise documented below in the visit note. 

## 2015-09-10 NOTE — Progress Notes (Signed)
Subjective:  Patient ID: Anita Banks, female    DOB: January 08, 1928  Age: 82 y.o. MRN: [id number]  CC: No chief complaint on file.   HPI Anita Banks presents for chest pressure at times; weak in the mornings (x years). C/o L knee pain - PT helped... BP ok at home  Outpatient Medications Prior to Visit  Medication Sig Dispense Refill  . aspirin 81 MG tablet Take 81 mg by mouth daily.    . Cholecalciferol (VITAMIN D3) 1000 UNITS CAPS Take 1 capsule (1,000 Units total) by mouth daily. 90 capsule 3  . Coenzyme Q10 (CO Q-10) 100 MG CAPS Take 1 tablet by mouth daily. 90 each 3  . Docusate Calcium (STOOL SOFTENER PO) Take by mouth daily. Reported on 04/29/2015    . estradiol (ESTRACE) 0.1 MG/GM vaginal cream Place 2 g vaginally 2 (two) times a week.    Marland Kitchen glucosamine-chondroitin 500-400 MG tablet Take 1 tablet by mouth 2 (two) times daily. 180 tablet 3  . levothyroxine (SYNTHROID, LEVOTHROID) 112 MCG tablet Take 1 tablet (112 mcg total) by mouth daily. 30 tablet 5  . lisinopril (PRINIVIL,ZESTRIL) 10 MG tablet TAKE 1 TABLET BY MOUTH IN THE MORNING AND 2 TABLETS IN THE EVENING 270 tablet 3  . LORazepam (ATIVAN) 1 MG tablet TAKE 1 TABLET BY MOUTH AT BEDTIME MAY REPEAT DOSE ONE TIME IF NEEDED 60 tablet 5  . meloxicam (MOBIC) 7.5 MG tablet TAKE 1 TABLET BY MOUTH EVERY DAY 30 tablet 0  . mirtazapine (REMERON) 15 MG tablet TAKE 1 TABLET (15 MG TOTAL) BY MOUTH AT BEDTIME. 90 tablet 0  . Multiple Vitamins-Minerals (CENTRUM SILVER ADULT 50+) TABS Take 1 tablet by mouth daily. 90 tablet 3  . Omega-3 Fatty Acids (FISH OIL) 1200 MG CAPS Take 1 capsule (1,200 mg total) by mouth daily. 90 capsule 3  . polyethylene glycol powder (GLYCOLAX/MIRALAX) powder MIX 1 SCOOP IN LIQUID AND TAKE ONCE DAILY. 527 g 5  . promethazine-codeine (PHENERGAN WITH CODEINE) 6.25-10 MG/5ML syrup Take 5 mLs by mouth every 4 (four) hours as needed. 300 mL 0  . topiramate (TOPAMAX) 25 MG tablet Take 25 mg by mouth 2 (two)  times daily.    . traMADol (ULTRAM) 50 MG tablet Take 1 tablet (50 mg total) by mouth every 12 (twelve) hours as needed. 50 tablet 2  . triamcinolone cream (KENALOG) 0.1 % Apply 1 application topically 2 (two) times daily. 160 g 3  . levofloxacin (LEVAQUIN) 500 MG tablet Take 1 tablet (500 mg total) by mouth daily. 7 tablet 0  . oseltamivir (TAMIFLU) 75 MG capsule Take 1 capsule (75 mg total) by mouth 2 (two) times daily. 10 capsule 0  . calcium-vitamin D (OSCAL WITH D) 500-200 MG-UNIT per tablet Take 1 tablet by mouth daily. (Patient not taking: Reported on 09/10/2015) 90 tablet 3  . chlorthalidone (HYGROTON) 25 MG tablet Take 1 tablet (25 mg total) by mouth daily. (Patient not taking: Reported on 09/10/2015) 90 tablet 1  . pantoprazole (PROTONIX) 40 MG tablet Take 1 tablet (40 mg total) by mouth daily. (Patient not taking: Reported on 09/10/2015) 30 tablet 11  . zolpidem (AMBIEN) 10 MG tablet TAKE 1 TABLET BY MOUTH EVERY DAY AS NEEDED FOR SLEEP (Patient not taking: Reported on 09/10/2015) 90 tablet 1   No facility-administered medications prior to visit.     ROS Review of Systems  Constitutional: Negative for activity change, appetite change, chills, fatigue and unexpected weight change.  HENT: Negative for congestion, mouth  sores and sinus pressure.   Eyes: Negative for visual disturbance.  Respiratory: Positive for chest tightness. Negative for cough.   Gastrointestinal: Negative for abdominal pain and nausea.  Genitourinary: Negative for difficulty urinating, frequency and vaginal pain.  Musculoskeletal: Positive for arthralgias. Negative for back pain and gait problem.  Skin: Negative for pallor and rash.  Neurological: Positive for weakness and headaches. Negative for dizziness, tremors and numbness.  Psychiatric/Behavioral: Positive for decreased concentration and sleep disturbance. Negative for confusion and suicidal ideas. The patient is nervous/anxious.     Objective:  BP (!) 160/98    Pulse 75   Wt 155 lb (70.3 kg)   SpO2 98%   BMI 26.61 kg/m   BP Readings from Last 3 Encounters:  09/10/15 (!) 160/98  05/18/15 120/63  05/08/15 118/70    Wt Readings from Last 3 Encounters:  09/10/15 155 lb (70.3 kg)  05/18/15 152 lb (68.9 kg)  05/08/15 153 lb (69.4 kg)    Physical Exam  Constitutional: She appears well-developed. No distress.  HENT:  Head: Normocephalic.  Right Ear: External ear normal.  Left Ear: External ear normal.  Nose: Nose normal.  Mouth/Throat: Oropharynx is clear and moist.  Eyes: Conjunctivae are normal. Pupils are equal, round, and reactive to light. Right eye exhibits no discharge. Left eye exhibits no discharge.  Neck: Normal range of motion. Neck supple. No JVD present. No tracheal deviation present. No thyromegaly present.  Cardiovascular: Normal rate, regular rhythm and normal heart sounds.   Pulmonary/Chest: No stridor. No respiratory distress. She has no wheezes.  Abdominal: Soft. Bowel sounds are normal. She exhibits no distension and no mass. There is no tenderness. There is no rebound and no guarding.  Musculoskeletal: She exhibits tenderness. She exhibits no edema.  Lymphadenopathy:    She has no cervical adenopathy.  Neurological: She displays normal reflexes. No cranial nerve deficit. She exhibits normal muscle tone. Coordination abnormal.  Skin: No rash noted. No erythema.  Psychiatric: She has a normal mood and affect. Her behavior is normal. Judgment and thought content normal.    Lab Results  Component Value Date   WBC 5.8 05/08/2015   HGB 11.5 (A) 05/08/2015   HCT 31.5 (A) 05/08/2015   PLT 285.0 09/04/2014   GLUCOSE 97 05/08/2015   CHOL 254 (H) 11/28/2011   TRIG 257 (H) 11/28/2011   HDL 50 11/28/2011   LDLDIRECT 186.4 02/20/2007   LDLCALC 153 (H) 11/28/2011   ALT 9 05/08/2015   AST 17 05/08/2015   NA 134 (L) 05/08/2015   K 4.1 05/08/2015   CL 102 05/08/2015   CREATININE 1.70 (H) 05/08/2015   BUN 29 (H) 05/08/2015    CO2 20 05/08/2015   TSH 0.59 05/06/2014    Mm Digital Screening  Result Date: 01/21/2013 CLINICAL DATA:  Screening. EXAM: DIGITAL SCREENING BILATERAL MAMMOGRAM WITH CAD COMPARISON:  Previous exam(s). ACR Breast Density Category b: There are scattered areas of fibroglandular density. FINDINGS: There are no findings suspicious for malignancy. Images were processed with CAD. IMPRESSION: No mammographic evidence of malignancy. A result letter of this screening mammogram will be mailed directly to the patient. RECOMMENDATION: Screening mammogram in one year. (Code:SM-B-01Y) BI-RADS CATEGORY  1: Negative Electronically Signed   By: Everlean Alstrom M.D.   On: 01/21/2013 14:53    Assessment & Plan:   There are no diagnoses linked to this encounter. I have discontinued Ms. Davlin's oseltamivir and levofloxacin. I am also having her maintain her estradiol, Docusate Calcium (STOOL SOFTENER PO),  calcium-vitamin D, Vitamin D3, CENTRUM SILVER ADULT 50+, glucosamine-chondroitin, Co Q-10, Fish Oil, chlorthalidone, topiramate, zolpidem, pantoprazole, aspirin, triamcinolone cream, lisinopril, LORazepam, promethazine-codeine, traMADol, meloxicam, mirtazapine, levothyroxine, and polyethylene glycol powder.  No orders of the defined types were placed in this encounter.    Follow-up: No Follow-up on file.  Walker Kehr, MD

## 2015-09-13 DIAGNOSIS — R0789 Other chest pain: Secondary | ICD-10-CM | POA: Insufficient documentation

## 2015-09-13 NOTE — Assessment & Plan Note (Signed)
On Mirtazapine only

## 2015-09-13 NOTE — Assessment & Plan Note (Signed)
Meloxicam prn 

## 2015-09-13 NOTE — Assessment & Plan Note (Signed)
On Levothroid 

## 2015-09-13 NOTE — Assessment & Plan Note (Signed)
The pt refused EKG and other w/up

## 2015-09-14 ENCOUNTER — Encounter: Payer: Self-pay | Admitting: Internal Medicine

## 2015-09-15 DIAGNOSIS — R2689 Other abnormalities of gait and mobility: Secondary | ICD-10-CM | POA: Diagnosis not present

## 2015-09-15 DIAGNOSIS — M170 Bilateral primary osteoarthritis of knee: Secondary | ICD-10-CM | POA: Diagnosis not present

## 2015-09-15 DIAGNOSIS — M25562 Pain in left knee: Secondary | ICD-10-CM | POA: Diagnosis not present

## 2015-09-17 ENCOUNTER — Other Ambulatory Visit: Payer: Self-pay | Admitting: Internal Medicine

## 2015-09-17 MED ORDER — BUTALBITAL-APAP-CAFFEINE 50-325-40 MG PO TABS: 1.0000 | ORAL_TABLET | Freq: Two times a day (BID) | ORAL | 2 refills | Status: DC | PRN

## 2015-09-22 DIAGNOSIS — R350 Frequency of micturition: Secondary | ICD-10-CM | POA: Diagnosis not present

## 2015-09-29 DIAGNOSIS — M25562 Pain in left knee: Secondary | ICD-10-CM | POA: Diagnosis not present

## 2015-09-29 DIAGNOSIS — M1712 Unilateral primary osteoarthritis, left knee: Secondary | ICD-10-CM | POA: Diagnosis not present

## 2015-09-29 DIAGNOSIS — R2689 Other abnormalities of gait and mobility: Secondary | ICD-10-CM | POA: Diagnosis not present

## 2015-09-30 DIAGNOSIS — N816 Rectocele: Secondary | ICD-10-CM | POA: Diagnosis not present

## 2015-09-30 DIAGNOSIS — N8111 Cystocele, midline: Secondary | ICD-10-CM | POA: Diagnosis not present

## 2015-11-02 DIAGNOSIS — M25562 Pain in left knee: Secondary | ICD-10-CM | POA: Diagnosis not present

## 2015-11-02 DIAGNOSIS — M25462 Effusion, left knee: Secondary | ICD-10-CM | POA: Diagnosis not present

## 2015-11-03 ENCOUNTER — Encounter: Payer: Self-pay | Admitting: Internal Medicine

## 2015-11-04 ENCOUNTER — Other Ambulatory Visit: Payer: Self-pay | Admitting: Internal Medicine

## 2015-11-04 ENCOUNTER — Other Ambulatory Visit: Payer: Self-pay | Admitting: Family Medicine

## 2015-11-05 NOTE — Telephone Encounter (Signed)
Pt daughter called in and wants to know if this can be called in today?

## 2015-11-07 ENCOUNTER — Other Ambulatory Visit: Payer: Self-pay | Admitting: Internal Medicine

## 2015-11-08 ENCOUNTER — Other Ambulatory Visit: Payer: Self-pay | Admitting: Family Medicine

## 2015-11-08 ENCOUNTER — Encounter: Payer: Self-pay | Admitting: Internal Medicine

## 2015-11-08 NOTE — Telephone Encounter (Signed)
Patient daughter called in again about this medication. Can you please follow up. Thank you.

## 2015-11-08 NOTE — Telephone Encounter (Signed)
Daughter left msg on triage stating pharmacy sent refill to wrong provider. Mom is needing refill on her Lorazepam. Pls advise...Johny Chess

## 2015-11-09 NOTE — Telephone Encounter (Signed)
Both refills done. Pt's daughter informed on 11/08/15.

## 2015-11-15 DIAGNOSIS — Z23 Encounter for immunization: Secondary | ICD-10-CM | POA: Diagnosis not present

## 2015-11-16 DIAGNOSIS — G44229 Chronic tension-type headache, not intractable: Secondary | ICD-10-CM | POA: Diagnosis not present

## 2015-11-16 DIAGNOSIS — R51 Headache: Secondary | ICD-10-CM | POA: Diagnosis not present

## 2015-11-18 NOTE — Telephone Encounter (Signed)
remeron already filled by pcp

## 2015-11-18 NOTE — Telephone Encounter (Signed)
lready refilled by pcp 11/2015

## 2015-12-13 ENCOUNTER — Other Ambulatory Visit: Payer: Self-pay | Admitting: Internal Medicine

## 2015-12-13 ENCOUNTER — Encounter: Payer: Self-pay | Admitting: Internal Medicine

## 2015-12-13 DIAGNOSIS — R5382 Chronic fatigue, unspecified: Secondary | ICD-10-CM

## 2015-12-20 ENCOUNTER — Other Ambulatory Visit (INDEPENDENT_AMBULATORY_CARE_PROVIDER_SITE_OTHER): Payer: Medicare Other

## 2015-12-20 DIAGNOSIS — R5382 Chronic fatigue, unspecified: Secondary | ICD-10-CM | POA: Diagnosis not present

## 2015-12-20 LAB — BASIC METABOLIC PANEL
BUN: 26 mg/dL — ABNORMAL HIGH (ref 6–23)
CHLORIDE: 108 meq/L (ref 96–112)
CO2: 23 meq/L (ref 19–32)
CREATININE: 1.48 mg/dL — AB (ref 0.40–1.20)
Calcium: 9.5 mg/dL (ref 8.4–10.5)
GFR: 35.51 mL/min — ABNORMAL LOW (ref >60.00)
Glucose, Bld: 94 mg/dL (ref 70–99)
Potassium: 4.4 mEq/L (ref 3.5–5.1)
Sodium: 139 mEq/L (ref 135–145)

## 2015-12-20 LAB — URINALYSIS, ROUTINE W REFLEX MICROSCOPIC
BILIRUBIN URINE: NEGATIVE
HGB URINE DIPSTICK: NEGATIVE
Ketones, ur: NEGATIVE
NITRITE: NEGATIVE
PH: 6 (ref 5.0–8.0)
RBC / HPF: NONE SEEN (ref 0–?)
Specific Gravity, Urine: <=1.005 — AB (ref 1.000–1.030)
TOTAL PROTEIN, URINE-UPE24: NEGATIVE
Urine Glucose: NEGATIVE
Urobilinogen, UA: 0.2 (ref 0.0–1.0)

## 2015-12-20 LAB — CBC WITH DIFFERENTIAL/PLATELET
BASOS PCT: 0.6 % (ref 0.0–3.0)
Basophils Absolute: 0 10*3/uL (ref 0.0–0.1)
Eosinophils Absolute: 0.1 10*3/uL (ref 0.0–0.7)
Eosinophils Relative: 2.2 % (ref 0.0–5.0)
HEMATOCRIT: 34.7 % — AB (ref 36.0–46.0)
Hemoglobin: 11.8 g/dL — ABNORMAL LOW (ref 12.0–15.0)
LYMPHS PCT: 23.9 % (ref 12.0–46.0)
Lymphs Abs: 1.5 10*3/uL (ref 0.7–4.0)
MCHC: 34 g/dL (ref 30.0–36.0)
MCV: 86.4 fl (ref 78.0–100.0)
MONOS PCT: 9.2 % (ref 3.0–12.0)
Monocytes Absolute: 0.6 10*3/uL (ref 0.1–1.0)
NEUTROS ABS: 4 10*3/uL (ref 1.4–7.7)
Neutrophils Relative %: 64.1 % (ref 43.0–77.0)
PLATELETS: 311 10*3/uL (ref 150.0–400.0)
RBC: 4.02 Mil/uL (ref 3.87–5.11)
RDW: 13.9 % (ref 11.5–15.5)
WBC: 6.2 10*3/uL (ref 4.0–10.5)

## 2015-12-20 LAB — HEPATIC FUNCTION PANEL
ALBUMIN: 4 g/dL (ref 3.5–5.2)
ALT: 11 U/L (ref 0–35)
AST: 16 U/L (ref 0–37)
Alkaline Phosphatase: 60 U/L (ref 39–117)
Bilirubin, Direct: 0.1 mg/dL (ref 0.0–0.3)
TOTAL PROTEIN: 7.5 g/dL (ref 6.0–8.3)
Total Bilirubin: 0.4 mg/dL (ref 0.2–1.2)

## 2015-12-20 LAB — TSH: TSH: 0.63 u[IU]/mL (ref 0.35–4.50)

## 2015-12-20 LAB — SEDIMENTATION RATE: SED RATE: 43 mm/h — AB (ref 0–30)

## 2015-12-21 ENCOUNTER — Ambulatory Visit (INDEPENDENT_AMBULATORY_CARE_PROVIDER_SITE_OTHER): Payer: Medicare Other | Admitting: Sports Medicine

## 2015-12-21 ENCOUNTER — Encounter (INDEPENDENT_AMBULATORY_CARE_PROVIDER_SITE_OTHER): Payer: Self-pay | Admitting: Sports Medicine

## 2015-12-21 VITALS — BP 140/86 | HR 77 | Ht 64.0 in | Wt 155.0 lb

## 2015-12-21 DIAGNOSIS — R51 Headache: Secondary | ICD-10-CM

## 2015-12-21 DIAGNOSIS — G8929 Other chronic pain: Secondary | ICD-10-CM

## 2015-12-21 DIAGNOSIS — M1711 Unilateral primary osteoarthritis, right knee: Secondary | ICD-10-CM | POA: Diagnosis not present

## 2015-12-21 MED ORDER — CELECOXIB 100 MG PO CAPS: 100.0000 mg | ORAL_CAPSULE | Freq: Two times a day (BID) | ORAL | 1 refills | Status: DC | PRN

## 2015-12-21 MED ORDER — HYALURONAN 88 MG/4ML IX SOSY
88.0000 mg | PREFILLED_SYRINGE | INTRA_ARTICULAR | Status: AC | PRN
  Administered 2015-12-21: 88 mg via INTRA_ARTICULAR

## 2015-12-21 NOTE — Progress Notes (Signed)
Anita Banks - 82 y.o. female MRN [id number]  Date of birth: 29-Apr-1927  Office Visit Note: Visit Date: 12/21/2015 PCP: Walker Kehr, MD Referred by: [name], MD  Subjective: Chief Complaint  Patient presents with  . Left Knee - Follow-up  . Follow-up    States pain in left knee.  Injection did help.  No swelling.  Hurts to stand and walk.   HPI: Patient with recurrent symptoms following aspiration & injection last visit. She had good resolution of symptoms for approximately 6 weeks but has had recurrence of her pain over the past one week. She is having swelling again. She is having some occasional clicking but this is minimal. No associated falls. She is using a compression sleeve with mild improvement.    ROS Otherwise per HPI.  Assessment & Plan: Visit Diagnoses:  1. Unilateral primary osteoarthritis, right knee   2. Chronic nonintractable headache, unspecified headache type     Plan: Findings:  Aspiration & injection with Monovisc today. Will see if this provides any longer lasting relief.  Will try her on Celebrex as well as it is beneficial for the knee as well as possibly for the chronic headaches that she has. If any persistent ongoing issues with the headaches will follow this up at next visit.  I am happy to follow up with this patient at my new location ([place] [facility]) for their chronic ongoing issues.      Meds & Orders:  Meds ordered this encounter  Medications  . celecoxib (CELEBREX) 100 MG capsule    Sig: Take 1 capsule (100 mg total) by mouth 2 (two) times daily as needed.    Dispense:  60 capsule    Refill:  1    Orders Placed This Encounter  Procedures  . Large Joint Injection/Arthrocentesis    Follow-up: Return if symptoms worsen or fail to improve.   Procedures: Large Joint Inj Date/Time: 12/21/2015 2:33 PM Performed by: [name] D Authorized by: [name] D    Indications:  Pain and joint swelling Location:  Knee Site:  R knee Needle Size:  18 G Approach:  Superolateral Ultrasound Guidance: Yes   Fluoroscopic Guidance: No   Arthrogram: No   Medications:  88 mg Hyaluronan 88 MG/4ML Aspiration Attempted: Yes   Aspirate amount (mL):  20 Aspirate:  Serous  The patient's clinical condition is marked by substantial pain and/or significant functional disability. Other conservative therapy has not provided relief, is contraindicated, or not appropriate. There is a reasonable likelihood that injection will significantly improve the patient's pain and/or functional impairment.  After discussing the risks, benefits and expected outcomes of the injection and all questions were reviewed and answered, the patient wished to undergo the above named procedure.  Verbal consent was obtained. The target sight was prepped with alcohol scrub. Local anesthesia was obtained with ethyl chloride and 77mL of 1% lidocaine on a 25g needle.  Under real-time ultrasound guidance, Aspiration and Injection of the target structure was performed using the above needle and medications under sterile stopcock technique. Band-Aid and 6" Ace Wrap was applied. The patient tolerated this procedure well with no immediate complications. Post injection instructions were provided.       No notes on file   Clinical History: No specialty comments available.  She reports that she has never smoked. She does not have any smokeless tobacco history on file. No results for input(s): HGBA1C, LABURIC in the last 8760  hours.  Objective:  VS:  HT:5\' 4"  (162.6 cm)   WT:155 lb (70.3 kg)  BMI:26.7    BP:140/86  HR:77bpm  TEMP: ( )  RESP:  Physical Exam  Constitutional: She appears well-developed and well-nourished. No distress.  Alert and appropriately interactive.  HENT:  Head: Normocephalic and atraumatic.  Pulmonary/Chest: Effort normal. No respiratory distress.  Skin: Skin is warm and dry.  No rash noted. She is not diaphoretic. No erythema. No pallor.  Psychiatric: She has a normal mood and affect. Her behavior is normal. Judgment and thought content normal.    Left Knee Exam   Comments:  Moderate non-tense effusion today. She has marked bossing of bilateral knees with range of motion from 4 to 105. She has pain within the popliteal fossa & a palpable Baker cyst that is non-tense. Otherwise ligamentously stable.     Imaging: No results found.  Past Medical/Family/Surgical/Social History: Medications & Allergies reviewed per EMR .fam Patient Active Problem List   Diagnosis Date Noted  . Chest pain, atypical 09/13/2015  . Acute upper respiratory infection 04/29/2015  . Pruritic condition 01/18/2015  . Actinic keratoses 09/13/2014  . Gait disorder 09/04/2014  . Rash and nonspecific skin eruption 09/04/2014  . Polymyalgia rheumatica ([place]) 04/20/2011  . INSOMNIA, PERSISTENT 06/26/2007  . HYPERCHOLESTEROLEMIA 02/20/2007  . Depression with anxiety 02/20/2007  . Hypothyroidism due to acquired atrophy of thyroid 12/03/2006  . Osteoarthritis of left knee 12/03/2006  . OSTEOPENIA 12/03/2006  . Migraine variant 11/26/2006  . IRRITABLE BOWEL SYNDROME 11/26/2006   Past Medical History:  Diagnosis Date  . Anxiety   . Arthritis   . Hypertension   . Migraine    No family history on file. Past Surgical History:  Procedure Laterality Date  . ABDOMINAL HYSTERECTOMY    . hemhorroid  2003   Social History   Occupational History  . Not on file.   Social History Main Topics  . Smoking status: Never Smoker  . Smokeless tobacco: Not on file  . Alcohol use Not on file  . Drug use: Unknown  . Sexual activity: Not on file

## 2015-12-22 ENCOUNTER — Encounter: Payer: Self-pay | Admitting: Internal Medicine

## 2015-12-24 ENCOUNTER — Ambulatory Visit (INDEPENDENT_AMBULATORY_CARE_PROVIDER_SITE_OTHER): Payer: Medicare Other | Admitting: Internal Medicine

## 2015-12-24 ENCOUNTER — Encounter: Payer: Self-pay | Admitting: Internal Medicine

## 2015-12-24 DIAGNOSIS — R269 Unspecified abnormalities of gait and mobility: Secondary | ICD-10-CM | POA: Diagnosis not present

## 2015-12-24 DIAGNOSIS — G43809 Other migraine, not intractable, without status migrainosus: Secondary | ICD-10-CM | POA: Diagnosis not present

## 2015-12-24 DIAGNOSIS — R519 Headache, unspecified: Secondary | ICD-10-CM

## 2015-12-24 DIAGNOSIS — R51 Headache: Secondary | ICD-10-CM | POA: Diagnosis not present

## 2015-12-24 DIAGNOSIS — F418 Other specified anxiety disorders: Secondary | ICD-10-CM | POA: Diagnosis not present

## 2015-12-24 MED ORDER — ESCITALOPRAM OXALATE 5 MG PO TABS: 5.0000 mg | ORAL_TABLET | Freq: Every day | ORAL | 5 refills | Status: DC

## 2015-12-24 NOTE — Assessment & Plan Note (Signed)
Worse. Home PT

## 2015-12-24 NOTE — Progress Notes (Signed)
Pre visit review using our clinic review tool, if applicable. No additional management support is needed unless otherwise documented below in the visit note. 

## 2015-12-24 NOTE — Progress Notes (Signed)
Subjective:  Patient ID: Anita Banks, female    DOB: 1927/12/24  Age: 82 y.o. MRN: [id number]  CC: Dizziness (was worse last week. Has gotten better this week)   HPI Anita Banks presents for HAs all the time - years; fatigue. C/o unsteady gait. C/o anxiety - dizzy, poor balance lately, HA - worse with anxiety. Seeing Dr Denna Haggard. They would like to see Dr Joretta Bachelor (Neurology)  Outpatient Medications Prior to Visit  Medication Sig Dispense Refill  . aspirin 81 MG tablet Take 81 mg by mouth daily.    . butalbital-acetaminophen-caffeine (FIORICET) 50-325-40 MG tablet Take 1 tablet by mouth 2 (two) times daily as needed for headache. 60 tablet 2  . calcium-vitamin D (OSCAL WITH D) 500-200 MG-UNIT per tablet Take 1 tablet by mouth daily. 90 tablet 3  . celecoxib (CELEBREX) 100 MG capsule Take 1 capsule (100 mg total) by mouth 2 (two) times daily as needed. 60 capsule 1  . chlorthalidone (HYGROTON) 25 MG tablet Take 1 tablet (25 mg total) by mouth daily. 90 tablet 1  . Cholecalciferol (VITAMIN D3) 1000 UNITS CAPS Take 1 capsule (1,000 Units total) by mouth daily. 90 capsule 3  . Coenzyme Q10 (CO Q-10) 100 MG CAPS Take 1 tablet by mouth daily. 90 each 3  . estradiol (ESTRACE) 0.1 MG/GM vaginal cream Place 2 g vaginally 2 (two) times a week.    Marland Kitchen glucosamine-chondroitin 500-400 MG tablet Take 1 tablet by mouth 2 (two) times daily. 180 tablet 3  . levothyroxine (SYNTHROID, LEVOTHROID) 112 MCG tablet Take 1 tablet (112 mcg total) by mouth daily. 30 tablet 5  . lisinopril (PRINIVIL,ZESTRIL) 10 MG tablet TAKE 1 TABLET BY MOUTH IN THE MORNING AND 2 TABLETS IN THE EVENING 270 tablet 3  . LORazepam (ATIVAN) 1 MG tablet TAKE 1 TABLET AT BEDTIME MAY REPEAT ONE TIME IF NEEDED 60 tablet 3  . meloxicam (MOBIC) 7.5 MG tablet TAKE 1 TABLET BY MOUTH EVERY DAY 30 tablet 0  . mirtazapine (REMERON) 15 MG tablet TAKE 1 TABLET (15 MG TOTAL) BY MOUTH AT BEDTIME. 90 tablet 1  . Multiple  Vitamins-Minerals (CENTRUM SILVER ADULT 50+) TABS Take 1 tablet by mouth daily. 90 tablet 3  . Omega-3 Fatty Acids (FISH OIL) 1200 MG CAPS Take 1 capsule (1,200 mg total) by mouth daily. 90 capsule 3  . polyethylene glycol powder (GLYCOLAX/MIRALAX) powder MIX 1 SCOOP IN LIQUID AND TAKE ONCE DAILY. 527 g 5  . triamcinolone cream (KENALOG) 0.1 % Apply 1 application topically 2 (two) times daily. 160 g 3  . topiramate (TOPAMAX) 25 MG tablet Take 25 mg by mouth 2 (two) times daily.     No facility-administered medications prior to visit.     ROS Review of Systems  Constitutional: Positive for fatigue. Negative for activity change, appetite change, chills and unexpected weight change.  HENT: Negative for congestion, mouth sores and sinus pressure.   Eyes: Negative for visual disturbance.  Respiratory: Negative for cough and chest tightness.   Gastrointestinal: Negative for abdominal pain and nausea.  Genitourinary: Negative for difficulty urinating, frequency and vaginal pain.  Musculoskeletal: Negative for back pain and gait problem.  Skin: Negative for pallor and rash.  Neurological: Negative for dizziness, tremors, weakness, numbness and headaches.  Psychiatric/Behavioral: Positive for decreased concentration. Negative for confusion, sleep disturbance and suicidal ideas. The patient is nervous/anxious.     Objective:  BP 140/84   Pulse 79   Temp 97.5 F (36.4 C) (Oral)  Wt 156 lb (70.8 kg)   SpO2 96%   BMI 26.78 kg/m   BP Readings from Last 3 Encounters:  12/24/15 140/84  12/21/15 140/86  09/10/15 139/85    Wt Readings from Last 3 Encounters:  12/24/15 156 lb (70.8 kg)  12/21/15 155 lb (70.3 kg)  09/10/15 155 lb (70.3 kg)    Physical Exam  Constitutional: She appears well-developed. No distress.  HENT:  Head: Normocephalic.  Right Ear: External ear normal.  Left Ear: External ear normal.  Nose: Nose normal.  Mouth/Throat: Oropharynx is clear and moist.  Eyes:  Conjunctivae are normal. Pupils are equal, round, and reactive to light. Right eye exhibits no discharge. Left eye exhibits no discharge.  Neck: Normal range of motion. Neck supple. No JVD present. No tracheal deviation present. No thyromegaly present.  Cardiovascular: Normal rate, regular rhythm and normal heart sounds.   Pulmonary/Chest: No stridor. No respiratory distress. She has no wheezes.  Abdominal: Soft. Bowel sounds are normal. She exhibits no distension and no mass. There is no tenderness. There is no rebound and no guarding.  Musculoskeletal: She exhibits tenderness. She exhibits no edema.  Lymphadenopathy:    She has no cervical adenopathy.  Neurological: She displays normal reflexes. No cranial nerve deficit. She exhibits normal muscle tone. Coordination abnormal.  Skin: No rash noted. No erythema.  Psychiatric: Her behavior is normal. Judgment and thought content normal.  depressed  ataxic tearful  Lab Results  Component Value Date   WBC 6.2 12/20/2015   HGB 11.8 (L) 12/20/2015   HCT 34.7 (L) 12/20/2015   PLT 311.0 12/20/2015   GLUCOSE 94 12/20/2015   CHOL 254 (H) 11/28/2011   TRIG 257 (H) 11/28/2011   HDL 50 11/28/2011   LDLDIRECT 186.4 02/20/2007   LDLCALC 153 (H) 11/28/2011   ALT 11 12/20/2015   AST 16 12/20/2015   NA 139 12/20/2015   K 4.4 12/20/2015   CL 108 12/20/2015   CREATININE 1.48 (H) 12/20/2015   BUN 26 (H) 12/20/2015   CO2 23 12/20/2015   TSH 0.63 12/20/2015    Mm Digital Screening  Result Date: 01/21/2013 CLINICAL DATA:  Screening. EXAM: DIGITAL SCREENING BILATERAL MAMMOGRAM WITH CAD COMPARISON:  Previous exam(s). ACR Breast Density Category b: There are scattered areas of fibroglandular density. FINDINGS: There are no findings suspicious for malignancy. Images were processed with CAD. IMPRESSION: No mammographic evidence of malignancy. A result letter of this screening mammogram will be mailed directly to the patient. RECOMMENDATION: Screening  mammogram in one year. (Code:SM-B-01Y) BI-RADS CATEGORY  1: Negative Electronically Signed   By: Everlean Alstrom M.D.   On: 01/21/2013 14:53    Assessment & Plan:   There are no diagnoses linked to this encounter. I am having Ms. Rix maintain her estradiol, calcium-vitamin D, Vitamin D3, CENTRUM SILVER ADULT 50+, glucosamine-chondroitin, Co Q-10, Fish Oil, chlorthalidone, aspirin, triamcinolone cream, lisinopril, meloxicam, levothyroxine, polyethylene glycol powder, butalbital-acetaminophen-caffeine, mirtazapine, LORazepam, celecoxib, and topiramate.  Meds ordered this encounter  Medications  . topiramate (TOPAMAX) 50 MG tablet    Sig: Take 50 mg by mouth daily.     Follow-up: No Follow-up on file.  Walker Kehr, MD

## 2015-12-24 NOTE — Assessment & Plan Note (Signed)
Seeing Dr Denna Haggard. They would like to see Dr Joretta Bachelor (Neurology)  Added Lexapro low dose

## 2015-12-24 NOTE — Assessment & Plan Note (Signed)
Worse Grieving - added Lexapro

## 2015-12-24 NOTE — Assessment & Plan Note (Signed)
Occip bloc option discussed

## 2015-12-28 DIAGNOSIS — G518 Other disorders of facial nerve: Secondary | ICD-10-CM | POA: Diagnosis not present

## 2015-12-28 DIAGNOSIS — R51 Headache: Secondary | ICD-10-CM | POA: Diagnosis not present

## 2015-12-28 DIAGNOSIS — M791 Myalgia: Secondary | ICD-10-CM | POA: Diagnosis not present

## 2015-12-28 DIAGNOSIS — G44229 Chronic tension-type headache, not intractable: Secondary | ICD-10-CM | POA: Diagnosis not present

## 2015-12-28 DIAGNOSIS — M542 Cervicalgia: Secondary | ICD-10-CM | POA: Diagnosis not present

## 2016-01-06 DIAGNOSIS — N898 Other specified noninflammatory disorders of vagina: Secondary | ICD-10-CM | POA: Diagnosis not present

## 2016-01-06 DIAGNOSIS — N816 Rectocele: Secondary | ICD-10-CM | POA: Diagnosis not present

## 2016-01-06 DIAGNOSIS — N8111 Cystocele, midline: Secondary | ICD-10-CM | POA: Diagnosis not present

## 2016-01-09 ENCOUNTER — Encounter: Payer: Self-pay | Admitting: Internal Medicine

## 2016-01-12 ENCOUNTER — Ambulatory Visit (INDEPENDENT_AMBULATORY_CARE_PROVIDER_SITE_OTHER): Payer: Medicare Other | Admitting: Internal Medicine

## 2016-01-12 ENCOUNTER — Ambulatory Visit (INDEPENDENT_AMBULATORY_CARE_PROVIDER_SITE_OTHER)
Admission: RE | Admit: 2016-01-12 | Discharge: 2016-01-12 | Disposition: A | Discharge status: Home / Self Care | Payer: Medicare Other | Source: Ambulatory Visit | Attending: Internal Medicine | Admitting: Internal Medicine

## 2016-01-12 ENCOUNTER — Encounter: Payer: Self-pay | Admitting: Internal Medicine

## 2016-01-12 ENCOUNTER — Telehealth: Payer: Self-pay

## 2016-01-12 VITALS — BP 136/78 | HR 84 | Temp 98.4°F | Resp 20 | Wt 155.0 lb

## 2016-01-12 DIAGNOSIS — R05 Cough: Secondary | ICD-10-CM | POA: Diagnosis not present

## 2016-01-12 DIAGNOSIS — R059 Cough, unspecified: Secondary | ICD-10-CM

## 2016-01-12 DIAGNOSIS — R062 Wheezing: Secondary | ICD-10-CM | POA: Diagnosis not present

## 2016-01-12 MED ORDER — PROMETHAZINE-CODEINE 6.25-10 MG/5ML PO SYRP: 5.0000 mL | ORAL_SOLUTION | Freq: Four times a day (QID) | ORAL | 0 refills | Status: DC | PRN

## 2016-01-12 MED ORDER — PREDNISONE 10 MG PO TABS: ORAL_TABLET | ORAL | 0 refills | Status: DC

## 2016-01-12 MED ORDER — DOXYCYCLINE HYCLATE 100 MG PO TABS: 100.0000 mg | ORAL_TABLET | Freq: Two times a day (BID) | ORAL | 0 refills | Status: DC

## 2016-01-12 NOTE — Telephone Encounter (Signed)
Order placed

## 2016-01-12 NOTE — Patient Instructions (Addendum)
You had the steroid shot today  Please take all new medication as prescribed - the antibiotic, cough medicine if needed, and prednisone  Please continue all other medications as before, and refills have been done if requested.  Please have the pharmacy call with any other refills you may need.  Please keep your appointments with your specialists as you may have planned  You had the chest xray today  You will be contacted by phone if any changes need to be made immediately.  Otherwise, you will receive a letter about your results with an explanation, but please check with MyChart first.  Please remember to sign up for MyChart if you have not done so, as this will be important to you in the future with finding out test results, communicating by private email, and scheduling acute appointments online when needed.

## 2016-01-12 NOTE — Progress Notes (Signed)
Pre visit review using our clinic review tool, if applicable. No additional management support is needed unless otherwise documented below in the visit note. 

## 2016-01-13 ENCOUNTER — Encounter: Payer: Self-pay | Admitting: Internal Medicine

## 2016-01-13 ENCOUNTER — Telehealth: Payer: Self-pay | Admitting: Internal Medicine

## 2016-01-13 NOTE — Telephone Encounter (Signed)
Requesting call back with results as soon as they come in today.

## 2016-01-14 ENCOUNTER — Telehealth: Payer: Self-pay | Admitting: Internal Medicine

## 2016-01-14 MED ORDER — AZITHROMYCIN 250 MG PO TABS: ORAL_TABLET | ORAL | 1 refills | Status: DC

## 2016-01-14 NOTE — Telephone Encounter (Signed)
Ok to change the doxy to azithromycin which is normally quite tolerable, and is unlikely to cause GI upset though

## 2016-01-14 NOTE — Telephone Encounter (Signed)
Daughter has called back.  States that [facility] told her that she would get a call back today with a change on antibiotic.  Please follow up with daughter at [phone].

## 2016-01-14 NOTE — Telephone Encounter (Signed)
P/t's daughter informed

## 2016-01-14 NOTE — Telephone Encounter (Signed)
PLEASE NOTE: All timestamps contained within this report are represented as [place] Standard Time. CONFIDENTIALTY NOTICE: This fax transmission is intended only for the addressee. It contains information that is legally privileged, confidential or otherwise protected from use or disclosure. If you are not the intended recipient, you are strictly prohibited from reviewing, disclosing, copying using or disseminating any of this information or taking any action in reliance on or regarding this information. If you have received this fax in error, please notify us immediately by telephone so that we can arrange for its return to [place]. Phone: [phone], Toll-Free: [phone], Fax: [phone] Page: 1 of 1 Call Id: [id number] [place] [facility] - Client [place] Patient Name: Anita Banks A DOB: September 12, 1927 Initial Comment Caller states- she began taking antibiotics, she has constant headache Nurse Assessment Nurse: Dimas Chyle, RN, Dellis Filbert Date/Time Eilene Ghazi Time): 01/14/2016 9:07:48 AM Confirm and document reason for call. If symptomatic, describe symptoms. ---Caller states- she began taking antibiotics, she has constant headache. Doxycycline started yesterday for possible bronchitis. No fever. Does the patient have any new or worsening symptoms? ---Yes Will a triage be completed? ---Yes Related visit to physician within the last 2 weeks? ---Yes Does the PT have any chronic conditions? (i.e. diabetes, asthma, etc.) ---Yes List chronic conditions. ---HTN Is this a behavioral health or substance abuse call? ---No Guidelines Guideline Title Affirmed Question Affirmed Notes Headache [1] New headache AND [2] age > 79 Final Disposition User See Physician within 24 Hours Cano Martin Pena, RN, Dellis Filbert Comments Check with your doctor immediately if any of the following side effects occur while taking doxycycline: Incidence not known headache Per  [email or web address] Spoke with office on backline and they advised to send report to office and either PCP or office would follow up with patient about possible medication side effects. Referrals REFERRED TO PCP OFFICE Disagree/Comply: Comply

## 2016-01-16 NOTE — Progress Notes (Signed)
Subjective:    Patient ID: MAZE SKLUZACEK, female    DOB: 04-Jul-1927, 82 y.o.   MRN: [id number]  HPI  Here with acute onset mild to mod 7 days ST, HA, general weakness and malaise, with prod cough greenish sputum, but Pt denies chest pain, increased sob or doe, wheezing, orthopnea, PND, increased LE swelling, palpitations, dizziness or syncope, except for onset mild wheezing/sob since last PM  Pt denies new neurological symptoms such as new headache, or facial or extremity weakness or numbness   Pt denies polydipsia, polyuria Past Medical History:  Diagnosis Date  . Anxiety   . Arthritis   . Hypertension   . Migraine    Past Surgical History:  Procedure Laterality Date  . ABDOMINAL HYSTERECTOMY    . hemhorroid  2003    reports that she has never smoked. She does not have any smokeless tobacco history on file. Her alcohol and drug histories are not on file. family history is not on file. Allergies  Allergen Reactions  . Amitriptyline Hcl   . Erythromycin   . Metronidazole   . Penicillins   . Rizatriptan Benzoate   . Rosuvastatin   . Tetracyclines & Related    Current Outpatient Prescriptions on File Prior to Visit  Medication Sig Dispense Refill  . aspirin 81 MG tablet Take 81 mg by mouth daily.    . butalbital-acetaminophen-caffeine (FIORICET) 50-325-40 MG tablet Take 1 tablet by mouth 2 (two) times daily as needed for headache. 60 tablet 2  . calcium-vitamin D (OSCAL WITH D) 500-200 MG-UNIT per tablet Take 1 tablet by mouth daily. 90 tablet 3  . chlorthalidone (HYGROTON) 25 MG tablet Take 1 tablet (25 mg total) by mouth daily. 90 tablet 1  . Cholecalciferol (VITAMIN D3) 1000 UNITS CAPS Take 1 capsule (1,000 Units total) by mouth daily. 90 capsule 3  . Coenzyme Q10 (CO Q-10) 100 MG CAPS Take 1 tablet by mouth daily. 90 each 3  . escitalopram (LEXAPRO) 5 MG tablet Take 1 tablet (5 mg total) by mouth daily. 30 tablet 5  . estradiol (ESTRACE) 0.1 MG/GM vaginal cream Place 2  g vaginally 2 (two) times a week.    Marland Kitchen glucosamine-chondroitin 500-400 MG tablet Take 1 tablet by mouth 2 (two) times daily. 180 tablet 3  . levothyroxine (SYNTHROID, LEVOTHROID) 112 MCG tablet Take 1 tablet (112 mcg total) by mouth daily. 30 tablet 5  . lisinopril (PRINIVIL,ZESTRIL) 10 MG tablet TAKE 1 TABLET BY MOUTH IN THE MORNING AND 2 TABLETS IN THE EVENING 270 tablet 3  . LORazepam (ATIVAN) 1 MG tablet TAKE 1 TABLET AT BEDTIME MAY REPEAT ONE TIME IF NEEDED 60 tablet 3  . meloxicam (MOBIC) 7.5 MG tablet TAKE 1 TABLET BY MOUTH EVERY DAY 30 tablet 0  . mirtazapine (REMERON) 15 MG tablet TAKE 1 TABLET (15 MG TOTAL) BY MOUTH AT BEDTIME. 90 tablet 1  . Multiple Vitamins-Minerals (CENTRUM SILVER ADULT 50+) TABS Take 1 tablet by mouth daily. 90 tablet 3  . Omega-3 Fatty Acids (FISH OIL) 1200 MG CAPS Take 1 capsule (1,200 mg total) by mouth daily. 90 capsule 3  . polyethylene glycol powder (GLYCOLAX/MIRALAX) powder MIX 1 SCOOP IN LIQUID AND TAKE ONCE DAILY. 527 g 5  . topiramate (TOPAMAX) 50 MG tablet Take 50 mg by mouth daily.    Marland Kitchen triamcinolone cream (KENALOG) 0.1 % Apply 1 application topically 2 (two) times daily. 160 g 3   No current facility-administered medications on file prior to visit.  Review of Systems   All other system neg per pt    Objective:   Physical Exam BP 136/78   Pulse 84   Temp 98.4 F (36.9 C) (Oral)   Resp 20   Wt 155 lb (70.3 kg)   SpO2 94%   BMI 26.61 kg/m  VS noted, mild ill Constitutional: Pt appears in no apparent distress HENT: Head: NCAT.  Right Ear: External ear normal.  Left Ear: External ear normal.  Eyes: . Pupils are equal, round, and reactive to light. Conjunctivae and EOM are normal Bilat tm's with mild erythema.  Max sinus areas non tender.  Pharynx with mild erythema, no exudate Neck: Normal range of motion. Neck supple.  Cardiovascular: Normal rate and regular rhythm.   Pulmonary/Chest: Effort normal and breath sounds decreased without  rales but with mild bilat scattered wheezing.  Neurological: Pt is alert. Not confused , motor grossly intact Skin: Skin is warm. No rash, no LE edema Psychiatric: Pt behavior is normal. No agitation.  No other new exam findings    Assessment & Plan:

## 2016-01-16 NOTE — Assessment & Plan Note (Signed)
Mild to mod, for depomedrol IM 80, and predpac asd,  to f/u any worsening symptoms or concerns 

## 2016-01-16 NOTE — Assessment & Plan Note (Signed)
Mild to mod, c/w bronchitis vs pna, declines cxr, for antibx course, cough med prn  to f/u any worsening symptoms or concerns 

## 2016-01-18 ENCOUNTER — Telehealth: Payer: Self-pay

## 2016-01-18 ENCOUNTER — Encounter: Payer: Self-pay | Admitting: Internal Medicine

## 2016-01-18 MED ORDER — LEVOFLOXACIN 250 MG PO TABS: 250.0000 mg | ORAL_TABLET | Freq: Every day | ORAL | 0 refills | Status: AC

## 2016-01-18 NOTE — Addendum Note (Signed)
Addended by: Biagio Borg on: 01/18/2016 01:14 PM   Modules accepted: Orders

## 2016-01-18 NOTE — Telephone Encounter (Signed)
Instead of doxy (has had this recently), and has allergy to PCN, emycin , tetracycline, will tx with levaquin 250 x 7 days

## 2016-01-18 NOTE — Telephone Encounter (Signed)
Please advise patient is having hives from the Zpak should she continue the medication

## 2016-01-18 NOTE — Telephone Encounter (Signed)
Ok to stop the zpack  Benadryl 50 mg q 6 hrs as needed  Ok to change to doxycycline

## 2016-01-25 DIAGNOSIS — R42 Dizziness and giddiness: Secondary | ICD-10-CM | POA: Diagnosis not present

## 2016-01-25 DIAGNOSIS — M353 Polymyalgia rheumatica: Secondary | ICD-10-CM | POA: Diagnosis not present

## 2016-01-25 DIAGNOSIS — M8580 Other specified disorders of bone density and structure, unspecified site: Secondary | ICD-10-CM | POA: Diagnosis not present

## 2016-01-25 DIAGNOSIS — G43809 Other migraine, not intractable, without status migrainosus: Secondary | ICD-10-CM | POA: Diagnosis not present

## 2016-01-25 DIAGNOSIS — F418 Other specified anxiety disorders: Secondary | ICD-10-CM | POA: Diagnosis not present

## 2016-01-25 DIAGNOSIS — M1712 Unilateral primary osteoarthritis, left knee: Secondary | ICD-10-CM | POA: Diagnosis not present

## 2016-01-25 DIAGNOSIS — G44229 Chronic tension-type headache, not intractable: Secondary | ICD-10-CM | POA: Diagnosis not present

## 2016-01-26 ENCOUNTER — Other Ambulatory Visit: Payer: Self-pay | Admitting: Internal Medicine

## 2016-01-26 ENCOUNTER — Encounter: Payer: Self-pay | Admitting: Internal Medicine

## 2016-01-27 MED ORDER — PREDNISONE 10 MG PO TABS: ORAL_TABLET | ORAL | 0 refills | Status: DC

## 2016-02-01 DIAGNOSIS — F418 Other specified anxiety disorders: Secondary | ICD-10-CM | POA: Diagnosis not present

## 2016-02-01 DIAGNOSIS — R42 Dizziness and giddiness: Secondary | ICD-10-CM | POA: Diagnosis not present

## 2016-02-01 DIAGNOSIS — M8580 Other specified disorders of bone density and structure, unspecified site: Secondary | ICD-10-CM | POA: Diagnosis not present

## 2016-02-01 DIAGNOSIS — G43809 Other migraine, not intractable, without status migrainosus: Secondary | ICD-10-CM | POA: Diagnosis not present

## 2016-02-01 DIAGNOSIS — M353 Polymyalgia rheumatica: Secondary | ICD-10-CM | POA: Diagnosis not present

## 2016-02-01 DIAGNOSIS — M1712 Unilateral primary osteoarthritis, left knee: Secondary | ICD-10-CM | POA: Diagnosis not present

## 2016-02-11 ENCOUNTER — Other Ambulatory Visit: Payer: Self-pay | Admitting: Internal Medicine

## 2016-02-15 DIAGNOSIS — G43809 Other migraine, not intractable, without status migrainosus: Secondary | ICD-10-CM | POA: Diagnosis not present

## 2016-02-15 DIAGNOSIS — M353 Polymyalgia rheumatica: Secondary | ICD-10-CM | POA: Diagnosis not present

## 2016-02-15 DIAGNOSIS — F418 Other specified anxiety disorders: Secondary | ICD-10-CM | POA: Diagnosis not present

## 2016-02-15 DIAGNOSIS — M1712 Unilateral primary osteoarthritis, left knee: Secondary | ICD-10-CM | POA: Diagnosis not present

## 2016-02-15 DIAGNOSIS — R42 Dizziness and giddiness: Secondary | ICD-10-CM | POA: Diagnosis not present

## 2016-02-15 DIAGNOSIS — M8580 Other specified disorders of bone density and structure, unspecified site: Secondary | ICD-10-CM | POA: Diagnosis not present

## 2016-02-17 DIAGNOSIS — M1712 Unilateral primary osteoarthritis, left knee: Secondary | ICD-10-CM | POA: Diagnosis not present

## 2016-02-17 DIAGNOSIS — R42 Dizziness and giddiness: Secondary | ICD-10-CM | POA: Diagnosis not present

## 2016-02-17 DIAGNOSIS — M8580 Other specified disorders of bone density and structure, unspecified site: Secondary | ICD-10-CM | POA: Diagnosis not present

## 2016-02-17 DIAGNOSIS — F418 Other specified anxiety disorders: Secondary | ICD-10-CM | POA: Diagnosis not present

## 2016-02-17 DIAGNOSIS — M353 Polymyalgia rheumatica: Secondary | ICD-10-CM | POA: Diagnosis not present

## 2016-02-17 DIAGNOSIS — G43809 Other migraine, not intractable, without status migrainosus: Secondary | ICD-10-CM | POA: Diagnosis not present

## 2016-02-24 DIAGNOSIS — F418 Other specified anxiety disorders: Secondary | ICD-10-CM | POA: Diagnosis not present

## 2016-02-24 DIAGNOSIS — M8580 Other specified disorders of bone density and structure, unspecified site: Secondary | ICD-10-CM | POA: Diagnosis not present

## 2016-02-24 DIAGNOSIS — R42 Dizziness and giddiness: Secondary | ICD-10-CM | POA: Diagnosis not present

## 2016-02-24 DIAGNOSIS — G43809 Other migraine, not intractable, without status migrainosus: Secondary | ICD-10-CM | POA: Diagnosis not present

## 2016-02-24 DIAGNOSIS — M1712 Unilateral primary osteoarthritis, left knee: Secondary | ICD-10-CM | POA: Diagnosis not present

## 2016-02-24 DIAGNOSIS — M353 Polymyalgia rheumatica: Secondary | ICD-10-CM | POA: Diagnosis not present

## 2016-02-25 ENCOUNTER — Telehealth: Payer: Self-pay | Admitting: Internal Medicine

## 2016-02-25 NOTE — Telephone Encounter (Signed)
Left detailed mess giving Anita Banks orders requested below.

## 2016-02-25 NOTE — Telephone Encounter (Signed)
Requesting verbal order to see patient for one more time.  Missed one visit.

## 2016-03-08 ENCOUNTER — Other Ambulatory Visit: Payer: Self-pay | Admitting: Internal Medicine

## 2016-03-08 ENCOUNTER — Encounter: Payer: Self-pay | Admitting: Internal Medicine

## 2016-03-09 ENCOUNTER — Other Ambulatory Visit: Payer: Self-pay | Admitting: Internal Medicine

## 2016-03-09 MED ORDER — LEVOTHYROXINE SODIUM 112 MCG PO TABS: 112.0000 ug | ORAL_TABLET | Freq: Every day | ORAL | 3 refills | Status: DC

## 2016-03-09 MED ORDER — POLYETHYLENE GLYCOL 3350 17 GM/SCOOP PO POWD: ORAL | 5 refills | Status: DC

## 2016-03-09 MED ORDER — MIRTAZAPINE 15 MG PO TABS: ORAL_TABLET | ORAL | 1 refills | Status: DC

## 2016-03-22 DIAGNOSIS — N8111 Cystocele, midline: Secondary | ICD-10-CM | POA: Diagnosis not present

## 2016-03-22 DIAGNOSIS — R350 Frequency of micturition: Secondary | ICD-10-CM | POA: Diagnosis not present

## 2016-03-22 DIAGNOSIS — N816 Rectocele: Secondary | ICD-10-CM | POA: Diagnosis not present

## 2016-03-27 DIAGNOSIS — G43009 Migraine without aura, not intractable, without status migrainosus: Secondary | ICD-10-CM | POA: Diagnosis not present

## 2016-03-31 DIAGNOSIS — N898 Other specified noninflammatory disorders of vagina: Secondary | ICD-10-CM | POA: Diagnosis not present

## 2016-04-13 DIAGNOSIS — N8111 Cystocele, midline: Secondary | ICD-10-CM | POA: Diagnosis not present

## 2016-04-13 DIAGNOSIS — N898 Other specified noninflammatory disorders of vagina: Secondary | ICD-10-CM | POA: Diagnosis not present

## 2016-04-13 DIAGNOSIS — N816 Rectocele: Secondary | ICD-10-CM | POA: Diagnosis not present

## 2016-05-22 ENCOUNTER — Ambulatory Visit: Payer: Medicare Other | Admitting: Sports Medicine

## 2016-05-23 ENCOUNTER — Ambulatory Visit: Payer: Medicare Other | Admitting: Sports Medicine

## 2016-06-05 ENCOUNTER — Ambulatory Visit: Payer: Self-pay

## 2016-06-05 ENCOUNTER — Ambulatory Visit (INDEPENDENT_AMBULATORY_CARE_PROVIDER_SITE_OTHER): Payer: Medicare Other | Admitting: Sports Medicine

## 2016-06-05 ENCOUNTER — Encounter: Payer: Self-pay | Admitting: Sports Medicine

## 2016-06-05 DIAGNOSIS — M1712 Unilateral primary osteoarthritis, left knee: Secondary | ICD-10-CM | POA: Diagnosis not present

## 2016-06-05 NOTE — Progress Notes (Signed)
OFFICE VISIT NOTE Anita Banks, [place] [facility] [place] - 83 y.o. female MRN [id number]  Date of birth: 03-04-1927  Visit Date: 06/05/2016  PCP: Anita Kehr, MD   Referred by: Anita Anger, MD  Anita Banks,cma acting as scribe for Dr. [name].  SUBJECTIVE:   Chief Complaint  Patient presents with  . Left Knee Pain    Anita Banks is here today with Daughter Anita Banks. Pt has previously seen Dr. [name] at [facility]. She is here to follow up on LT knee pain. Last November pt had relief with aspiration/medication injection. For the past 3 weeks pain has been recurrent. Standing triggers the pain. She can walk for short periods of time then will have to rest.     Review of Systems  Constitutional: Negative.   HENT: Negative.   Eyes: Negative.   Respiratory: Negative.   Cardiovascular: Negative.   Gastrointestinal: Negative.   Genitourinary: Negative.   Musculoskeletal: Positive for joint pain.  Skin: Negative.   Neurological: Negative.   Endo/Heme/Allergies: Negative.   Psychiatric/Behavioral: Negative.     Otherwise per HPI.  HISTORY & PERTINENT PRIOR DATA:  No specialty comments available. She reports that she has never smoked. She has never used smokeless tobacco. No results for input(s): HGBA1C, LABURIC in the last 8760 hours. Medications & Allergies reviewed per EMR Patient Active Problem List   Diagnosis Date Noted  . Cough 01/12/2016  . Wheezing 01/12/2016  . Occipital headache 12/24/2015  . Chest pain, atypical 09/13/2015  . Acute upper respiratory infection 04/29/2015  . Pruritic condition 01/18/2015  . Actinic keratoses 09/13/2014  . Gait disorder 09/04/2014  . Rash and nonspecific skin eruption 09/04/2014  . Polymyalgia rheumatica ([place]) 04/20/2011  . INSOMNIA, PERSISTENT 06/26/2007  . HYPERCHOLESTEROLEMIA 02/20/2007  . Depression with anxiety 02/20/2007  .  Hypothyroidism due to acquired atrophy of thyroid 12/03/2006  . Osteoarthritis of left knee 12/03/2006  . OSTEOPENIA 12/03/2006  . Migraine variant 11/26/2006  . IRRITABLE BOWEL SYNDROME 11/26/2006   Past Medical History:  Diagnosis Date  . Anxiety   . Arthritis   . Hypertension   . Migraine    No family history on file. Past Surgical History:  Procedure Laterality Date  . ABDOMINAL HYSTERECTOMY    . hemhorroid  2003   Social History   Occupational History  . Not on file.   Social History Main Topics  . Smoking status: Never Smoker  . Smokeless tobacco: Never Used  . Alcohol use Not on file  . Drug use: No  . Sexual activity: Not on file    OBJECTIVE:  VS:  HT:    WT:   BMI:     BP:   HR: bpm  TEMP: ( )  RESP:  Physical Exam  Constitutional: She appears well-developed and well-nourished. She is cooperative.  Non-toxic appearance.  HENT:  Head: Normocephalic and atraumatic.  Cardiovascular: Intact distal pulses.   Pulmonary/Chest: No accessory muscle usage. No respiratory distress.  Neurological: She is alert. She is not disoriented. She displays normal reflexes. No sensory deficit.  Skin: Skin is warm, dry and intact. Capillary refill takes less than 2 seconds. No abrasion and no rash noted.  Psychiatric: She has a normal mood and affect. Her speech is normal and behavior is normal. Thought content normal.    Left Knee Exam  Swelling: Mild Effusion: Yes  Tenderness  None  Range of Motion  Extension: Normal  Flexion:     Normal  Tests  Patellar Apprehension: No  Right Knee Exam   Comments:  Generalized bossing.  No significant effusion.     IMAGING & PROCEDURES: No results found. Findings:  +++++++++++++++++++++++++++++++++++++++++++++++++++++++++++++++ PROCEDURE NOTE: THERAPEUTIC EXERCISES (97110) 15 minutes spent for Therapeutic exercises as stated in above notes.  This included exercises focusing on stretching, strengthening, with  significant focus on eccentric aspects.   Proper technique shown and discussed handout in great detail with ATC.  All questions were discussed and answered.     PROCEDURE NOTE: ULTRASOUND GUIDED LEFT KNEE ASPIRATION & INJECTION  Date/Time: 06/05/2016 5:22 PM Performed by: [name] D Authorized by: [name] D  Comments:  Images were obtained and interpreted by myself, [name], DO  Images have been saved and stored to PACS system. Images obtained on: GE S7 Ultrasound machine  ULTRASOUND FINDINGS: MODERATE EFFUSION  DESCRIPTION OF PROCEDURE:  The patient's clinical condition is marked by substantial pain and/or significant functional disability. Other conservative therapy has not provided relief, is contraindicated, or not appropriate. There is a reasonable likelihood that injection will significantly improve the patient's pain and/or functional impairment. After discussing the risks, benefits and expected outcomes of the injection and all questions were reviewed and answered, the patient wished to undergo the above named procedure. Verbal consent was obtained. The ultrasound was used to identify the target structure and adjacent neurovascular structures. The skin was then prepped in sterile fashion and the target structure was injected under direct visualization using sterile technique as below: PREP: Alcohol, Ethel Chloride, 5cc 1% lidocaine on 25 needle APPROACH: Superiolateral, stopcock technique, 18g 1.5" needle INJECTATE: 2 cc 0.5% marcaine, 1 cc 40mg  DepoMedrol ASPIRATE: 15CC SEROUS FLUID  DRESSING: Band-Aid  Post procedural instructions including recommending icing and warning signs for infection were reviewed. This procedure was well tolerated and there were no complications.   IMPRESSION: Succesful US Guided Aspiration & injection     ASSESSMENT & PLAN:  Visit Diagnoses:  1. Primary osteoarthritis of left knee    Meds: No orders of the defined types were  placed in this encounter.   Orders:  Orders Placed This Encounter  Procedures  . US GUIDED NEEDLE PLACEMENT(NO LINKED CHARGES)    Follow-up: Return in about 4 weeks (around 07/03/2016), or for  Monovisc injections.  Otherwise please see problem oriented charting as below.  CMA/ATC served as Education administrator during this visit. History, Physical, and Plan performed by medical provider. Documentation and orders reviewed and attested to.      [name], [place] [facility] Physician    06/05/2016 6:20 PM

## 2016-06-05 NOTE — Patient Instructions (Signed)
Please perform the exercise program that Anita Banks has prepared for you and gone over in detail on a daily basis.  In addition to the handout you were provided you can access your program through: [email or web address]

## 2016-06-05 NOTE — Assessment & Plan Note (Signed)
Subjective Report:  She reports being significantly better than in the past.  The Monovisc injection has been the most beneficial injection that she has had.  She reports only minimal swelling today.  Denies any catching, locking or giving way.     Assessment & Plan & Follow up Issues:  Chronic condition  -slight worsening over the past 1 month. 1. Aspiration and cortisone injection today. 2. Preapprove for repeat Monovisc

## 2016-06-23 ENCOUNTER — Other Ambulatory Visit: Payer: Self-pay | Admitting: Internal Medicine

## 2016-07-05 DIAGNOSIS — N8111 Cystocele, midline: Secondary | ICD-10-CM | POA: Diagnosis not present

## 2016-07-05 DIAGNOSIS — N816 Rectocele: Secondary | ICD-10-CM | POA: Diagnosis not present

## 2016-07-05 DIAGNOSIS — Z124 Encounter for screening for malignant neoplasm of cervix: Secondary | ICD-10-CM | POA: Diagnosis not present

## 2016-07-05 DIAGNOSIS — Z1231 Encounter for screening mammogram for malignant neoplasm of breast: Secondary | ICD-10-CM | POA: Diagnosis not present

## 2016-07-10 ENCOUNTER — Other Ambulatory Visit: Payer: Self-pay | Admitting: Internal Medicine

## 2016-07-10 ENCOUNTER — Encounter: Payer: Self-pay | Admitting: Internal Medicine

## 2016-08-17 ENCOUNTER — Other Ambulatory Visit: Payer: Self-pay | Admitting: Internal Medicine

## 2016-08-17 DIAGNOSIS — N8111 Cystocele, midline: Secondary | ICD-10-CM | POA: Diagnosis not present

## 2016-08-17 DIAGNOSIS — R350 Frequency of micturition: Secondary | ICD-10-CM | POA: Diagnosis not present

## 2016-08-17 DIAGNOSIS — N760 Acute vaginitis: Secondary | ICD-10-CM | POA: Diagnosis not present

## 2016-08-17 DIAGNOSIS — N816 Rectocele: Secondary | ICD-10-CM | POA: Diagnosis not present

## 2016-08-17 DIAGNOSIS — R159 Full incontinence of feces: Secondary | ICD-10-CM | POA: Diagnosis not present

## 2016-08-21 ENCOUNTER — Encounter: Payer: Self-pay | Admitting: Internal Medicine

## 2016-08-23 ENCOUNTER — Other Ambulatory Visit: Payer: Self-pay | Admitting: Internal Medicine

## 2016-08-23 MED ORDER — LORAZEPAM 1 MG PO TABS: ORAL_TABLET | ORAL | 3 refills | Status: DC

## 2016-08-29 ENCOUNTER — Ambulatory Visit: Payer: Self-pay

## 2016-08-29 ENCOUNTER — Ambulatory Visit (INDEPENDENT_AMBULATORY_CARE_PROVIDER_SITE_OTHER): Payer: Medicare Other | Admitting: Sports Medicine

## 2016-08-29 ENCOUNTER — Encounter: Payer: Self-pay | Admitting: Sports Medicine

## 2016-08-29 ENCOUNTER — Telehealth: Payer: Self-pay | Admitting: Sports Medicine

## 2016-08-29 VITALS — BP 142/82 | HR 60 | Ht 64.0 in | Wt 160.0 lb

## 2016-08-29 DIAGNOSIS — M1712 Unilateral primary osteoarthritis, left knee: Secondary | ICD-10-CM | POA: Diagnosis not present

## 2016-08-29 DIAGNOSIS — R600 Localized edema: Secondary | ICD-10-CM | POA: Diagnosis not present

## 2016-08-29 NOTE — Progress Notes (Signed)
OFFICE VISIT NOTE Anita Banks. Anita Banks, Anita Banks at [place] - 83 y.o. female MRN [id number]  Date of birth: 04/10/1927  Visit Date: 08/29/2016  PCP: [name], MD   Referred by: [name], MD  Burlene Arnt, CMA acting as scribe for Dr. [name].  SUBJECTIVE:   Chief Complaint  Patient presents with  . Osteoarthritis of Left Knee   HPI: As below and per problem based documentation when appropriate.   Pt presents today in follow-up of osteoarthritis of the left knee. She had ultrasound guided aspiration and steroid injection 06/05/2016. She was also given home therapeutic exercises. Pt is interested in receiving Monovisc injection today.   Knee pain has improved some since her last visit. It is better than before but she is still having pain. She has pain when walking. She walks every morning for about 50 minutes and she feels weakness and fatigue so she has to stop about every 15 minutes. She does have mild redness and swelling in the legs. The legs are not warm to touch. She has compression socks that she wears during the day. The knee pain is just in the left knee but the leg pain is bilateral. She has pain when going up and down stairs but pain is worse when going down stairs. When she walks around in the house she walks without assistance but when she walks outside she always has someone there to help her incase she looses or balance.       Review of Systems  Constitutional: Negative for chills and fever.  Respiratory: Negative for shortness of breath and wheezing.   Cardiovascular: Positive for leg swelling. Negative for chest pain and palpitations.  Musculoskeletal: Positive for joint pain. Negative for falls.  Neurological: Negative for dizziness, tingling and headaches.  Endo/Heme/Allergies: Does not bruise/bleed easily.    Otherwise per HPI.  HISTORY & PERTINENT PRIOR DATA:    No specialty comments available. She reports that she has never smoked. She has never used smokeless tobacco. No results for input(s): HGBA1C, LABURIC in the last 8760 hours. Medications & Allergies reviewed per EMR Patient Active Problem List   Diagnosis Date Noted  . Lower extremity edema 08/29/2016  . Cough 01/12/2016  . Wheezing 01/12/2016  . Occipital headache 12/24/2015  . Chest pain, atypical 09/13/2015  . Acute upper respiratory infection 04/29/2015  . Pruritic condition 01/18/2015  . Actinic keratoses 09/13/2014  . Gait disorder 09/04/2014  . Rash and nonspecific skin eruption 09/04/2014  . Polymyalgia rheumatica ([place]) 04/20/2011  . INSOMNIA, PERSISTENT 06/26/2007  . HYPERCHOLESTEROLEMIA 02/20/2007  . Depression with anxiety 02/20/2007  . Hypothyroidism due to acquired atrophy of thyroid 12/03/2006  . Osteoarthritis of left knee 12/03/2006  . OSTEOPENIA 12/03/2006  . Migraine variant 11/26/2006  . IRRITABLE BOWEL SYNDROME 11/26/2006   Past Medical History:  Diagnosis Date  . Anxiety   . Arthritis   . Hypertension   . Migraine    No family history on file. Past Surgical History:  Procedure Laterality Date  . ABDOMINAL HYSTERECTOMY    . hemhorroid  2003   Social History   Occupational History  . Not on file.   Social History Main Topics  . Smoking status: Never Smoker  . Smokeless tobacco: Never Used  . Alcohol use Not on file  . Drug use: No  . Sexual activity: Not on file    OBJECTIVE:  VS:  HT:5\' 4"  (  162.6 cm)   WT:160 lb (72.6 kg)  BMI:27.5    BP:(!) 142/82  HR:60bpm  TEMP: ( )  RESP:96 % EXAM: Findings:  Bilateral lower extremities overall well aligned.  1+ pitting edema in the bilateral pretibial regions.  She has no significant knee effusion today.  Ligamentously stable.  Pain and grinding with patellar grind that is mild.  No focal joint line tenderness.  No significant calf tenderness     No results found. ASSESSMENT & PLAN:      ICD-10-CM   1. Primary osteoarthritis of left knee M17.12 [place] LIMITED JOINT SPACE STRUCTURES LOW LEFT(NO LINKED CHARGES)  2. Lower extremity edema R60.0   ================================================================= Lower extremity edema Symptoms are consistent with venous insufficiency.  Recommend over-the-counter knee high compression socks.  If persistent ongoing symptoms that do not resolve by the next morning when she awakes can consider further diagnostic evaluation and/or consideration of medication changes for her blood pressure medicines as do seem to correlate from a time standpoint.  Will defer to PCP for this.    Osteoarthritis of left knee Previously has responded well to Monovisc and this was last performed greater than 6 months ago.  Repeat Monovisc injection was performed today.  She did respond well to injection approximately 3 months ago with corticosteroid and has had incomplete relief of her symptoms.  ================================================================= Follow-up: Return if symptoms worsen or fail to improve.   CMA/ATC served as Education administrator during this visit. History, Physical, and Plan performed by medical provider. Documentation and orders reviewed and attested to.      [name], [place] [facility] Physician

## 2016-08-29 NOTE — Assessment & Plan Note (Signed)
Symptoms are consistent with venous insufficiency.  Recommend over-the-counter knee high compression socks.  If persistent ongoing symptoms that do not resolve by the next morning when she awakes can consider further diagnostic evaluation and/or consideration of medication changes for her blood pressure medicines as do seem to correlate from a time standpoint.  Will defer to PCP for this.

## 2016-08-29 NOTE — Telephone Encounter (Signed)
Noted  

## 2016-08-29 NOTE — Patient Instructions (Signed)

## 2016-08-29 NOTE — Assessment & Plan Note (Signed)
Previously has responded well to [place] and this was last performed greater than 6 months ago.  Repeat Monovisc injection was performed today.  She did respond well to injection approximately 3 months ago with corticosteroid and has had incomplete relief of her symptoms.

## 2016-08-29 NOTE — Telephone Encounter (Signed)
Scheduled mom for monovisc knee inj at 4pm today.  Thank you,  -LL

## 2016-08-29 NOTE — Procedures (Signed)
PROCEDURE NOTE - ULTRASOUND GUIDED INJECTION: Left Knee monovisc Images were obtained and interpreted by myself, [name], DO  Images have been saved and stored to PACS system. Images obtained on: GE S7 Ultrasound machine  ULTRASOUND FINDINGS:  Small knee effusion. Degenerative changes appreciated  DESCRIPTION OF PROCEDURE:  The patient's clinical condition is marked by substantial pain and/or significant functional disability. Other conservative therapy has not provided relief, is contraindicated, or not appropriate. There is a reasonable likelihood that injection will significantly improve the patient's pain and/or functional impairment. After discussing the risks, benefits and expected outcomes of the injection and all questions were reviewed and answered, the patient wished to undergo the above named procedure. Verbal consent was obtained. The ultrasound was used to identify the target structure and adjacent neurovascular structures. The skin was then prepped in sterile fashion and the target structure was injected under direct visualization using sterile technique as below: PREP: Alcohol, Ethel Chloride APPROACH: Superiolateral, single injection, 21g 2" needle INJECTATE: 3 cc 1% lidocaine, 88mg  Monovisc Injection ASPIRATE: N/A DRESSING: Band-Aid   Post procedural instructions including recommending icing and warning signs for infection were reviewed. This procedure was well tolerated and there were no complications.   IMPRESSION: Succesful US Guided Injection

## 2016-09-03 ENCOUNTER — Other Ambulatory Visit: Payer: Self-pay | Admitting: Internal Medicine

## 2016-09-10 ENCOUNTER — Other Ambulatory Visit: Payer: Self-pay | Admitting: Internal Medicine

## 2016-09-19 DIAGNOSIS — N8111 Cystocele, midline: Secondary | ICD-10-CM | POA: Diagnosis not present

## 2016-09-19 DIAGNOSIS — N816 Rectocele: Secondary | ICD-10-CM | POA: Diagnosis not present

## 2016-10-02 DIAGNOSIS — G43009 Migraine without aura, not intractable, without status migrainosus: Secondary | ICD-10-CM | POA: Diagnosis not present

## 2016-10-07 ENCOUNTER — Other Ambulatory Visit: Payer: Self-pay | Admitting: Internal Medicine

## 2016-10-08 ENCOUNTER — Other Ambulatory Visit: Payer: Self-pay | Admitting: Internal Medicine

## 2016-10-10 ENCOUNTER — Other Ambulatory Visit: Payer: Self-pay | Admitting: General Practice

## 2016-10-10 MED ORDER — LEVOTHYROXINE SODIUM 112 MCG PO TABS: 112.0000 ug | ORAL_TABLET | Freq: Every day | ORAL | 0 refills | Status: DC

## 2016-10-10 MED ORDER — POLYETHYLENE GLYCOL 3350 17 GM/SCOOP PO POWD: ORAL | 2 refills | Status: AC

## 2016-10-28 ENCOUNTER — Other Ambulatory Visit: Payer: Self-pay | Admitting: Internal Medicine

## 2016-11-12 DIAGNOSIS — Z23 Encounter for immunization: Secondary | ICD-10-CM | POA: Diagnosis not present

## 2016-12-26 ENCOUNTER — Other Ambulatory Visit: Payer: Self-pay | Admitting: Internal Medicine

## 2017-01-23 DIAGNOSIS — N816 Rectocele: Secondary | ICD-10-CM | POA: Diagnosis not present

## 2017-01-23 DIAGNOSIS — N8111 Cystocele, midline: Secondary | ICD-10-CM | POA: Diagnosis not present

## 2017-01-25 ENCOUNTER — Other Ambulatory Visit: Payer: Self-pay | Admitting: Internal Medicine

## 2017-01-25 ENCOUNTER — Other Ambulatory Visit: Payer: Self-pay

## 2017-01-27 ENCOUNTER — Other Ambulatory Visit: Payer: Self-pay | Admitting: Internal Medicine

## 2017-01-27 ENCOUNTER — Encounter: Payer: Self-pay | Admitting: Internal Medicine

## 2017-01-29 ENCOUNTER — Other Ambulatory Visit: Payer: Self-pay | Admitting: Internal Medicine

## 2017-01-29 MED ORDER — LEVOTHYROXINE SODIUM 112 MCG PO TABS: 112.0000 ug | ORAL_TABLET | Freq: Every day | ORAL | 0 refills | Status: DC

## 2017-01-29 MED ORDER — ESCITALOPRAM OXALATE 5 MG PO TABS: 5.0000 mg | ORAL_TABLET | Freq: Every day | ORAL | 0 refills | Status: DC

## 2017-01-29 NOTE — Addendum Note (Signed)
Addended by: Karren Cobble on: 01/29/2017 10:27 AM   Modules accepted: Orders

## 2017-03-08 ENCOUNTER — Encounter: Payer: Self-pay | Admitting: Internal Medicine

## 2017-03-08 MED ORDER — POLYETHYLENE GLYCOL 3350 17 GM/SCOOP PO POWD: ORAL | 2 refills | Status: DC

## 2017-03-08 MED ORDER — TRIAMCINOLONE ACETONIDE 0.1 % EX CREA: 1.0000 "application " | TOPICAL_CREAM | Freq: Two times a day (BID) | CUTANEOUS | 0 refills | Status: AC

## 2017-04-09 ENCOUNTER — Ambulatory Visit: Payer: Medicare Other | Admitting: Sports Medicine

## 2017-04-09 DIAGNOSIS — Z0289 Encounter for other administrative examinations: Secondary | ICD-10-CM

## 2017-04-11 ENCOUNTER — Encounter: Payer: Self-pay | Admitting: Sports Medicine

## 2017-04-17 DIAGNOSIS — Z9181 History of falling: Secondary | ICD-10-CM | POA: Diagnosis not present

## 2017-04-17 DIAGNOSIS — G43009 Migraine without aura, not intractable, without status migrainosus: Secondary | ICD-10-CM | POA: Diagnosis not present

## 2017-04-21 ENCOUNTER — Other Ambulatory Visit: Payer: Self-pay | Admitting: Internal Medicine

## 2017-04-27 DIAGNOSIS — Z7982 Long term (current) use of aspirin: Secondary | ICD-10-CM | POA: Diagnosis not present

## 2017-04-27 DIAGNOSIS — R2689 Other abnormalities of gait and mobility: Secondary | ICD-10-CM | POA: Diagnosis not present

## 2017-04-27 DIAGNOSIS — E039 Hypothyroidism, unspecified: Secondary | ICD-10-CM | POA: Diagnosis not present

## 2017-04-27 DIAGNOSIS — M1712 Unilateral primary osteoarthritis, left knee: Secondary | ICD-10-CM | POA: Diagnosis not present

## 2017-04-27 DIAGNOSIS — G43909 Migraine, unspecified, not intractable, without status migrainosus: Secondary | ICD-10-CM | POA: Diagnosis not present

## 2017-04-27 DIAGNOSIS — E785 Hyperlipidemia, unspecified: Secondary | ICD-10-CM | POA: Diagnosis not present

## 2017-04-27 DIAGNOSIS — F329 Major depressive disorder, single episode, unspecified: Secondary | ICD-10-CM | POA: Diagnosis not present

## 2017-04-27 DIAGNOSIS — F419 Anxiety disorder, unspecified: Secondary | ICD-10-CM | POA: Diagnosis not present

## 2017-04-27 DIAGNOSIS — K589 Irritable bowel syndrome without diarrhea: Secondary | ICD-10-CM | POA: Diagnosis not present

## 2017-04-30 ENCOUNTER — Telehealth: Payer: Self-pay | Admitting: Internal Medicine

## 2017-04-30 NOTE — Telephone Encounter (Signed)
Copied from [place] [phone] [phone]. Topic: Quick Communication - See Telephone Encounter >> Apr 30, 2017  9:14 AM Cleaster Corin, NT wrote: CRM for notification. See Telephone encounter for: 04/30/17 [facility] Aquas from [facility] calling to receive verbal orders for PT. For balancing and strengthen 1 week 2 2 week 1 1 week 2 Tharon Aquas can be reached at [phone] can leave vm.

## 2017-05-01 DIAGNOSIS — R2689 Other abnormalities of gait and mobility: Secondary | ICD-10-CM | POA: Diagnosis not present

## 2017-05-01 DIAGNOSIS — M1712 Unilateral primary osteoarthritis, left knee: Secondary | ICD-10-CM | POA: Diagnosis not present

## 2017-05-01 DIAGNOSIS — F419 Anxiety disorder, unspecified: Secondary | ICD-10-CM | POA: Diagnosis not present

## 2017-05-01 DIAGNOSIS — F329 Major depressive disorder, single episode, unspecified: Secondary | ICD-10-CM | POA: Diagnosis not present

## 2017-05-01 DIAGNOSIS — K589 Irritable bowel syndrome without diarrhea: Secondary | ICD-10-CM | POA: Diagnosis not present

## 2017-05-01 DIAGNOSIS — G43909 Migraine, unspecified, not intractable, without status migrainosus: Secondary | ICD-10-CM | POA: Diagnosis not present

## 2017-05-02 DIAGNOSIS — N816 Rectocele: Secondary | ICD-10-CM | POA: Diagnosis not present

## 2017-05-02 DIAGNOSIS — N898 Other specified noninflammatory disorders of vagina: Secondary | ICD-10-CM | POA: Diagnosis not present

## 2017-05-02 DIAGNOSIS — N8111 Cystocele, midline: Secondary | ICD-10-CM | POA: Diagnosis not present

## 2017-05-02 IMAGING — CR DG CHEST 2V
2 series · 2 of 2 positions shown · non-contrast
Comparison: August 06, 2010.

CLINICAL DATA: Cough.

EXAM:
CHEST  2 VIEW

[PA]
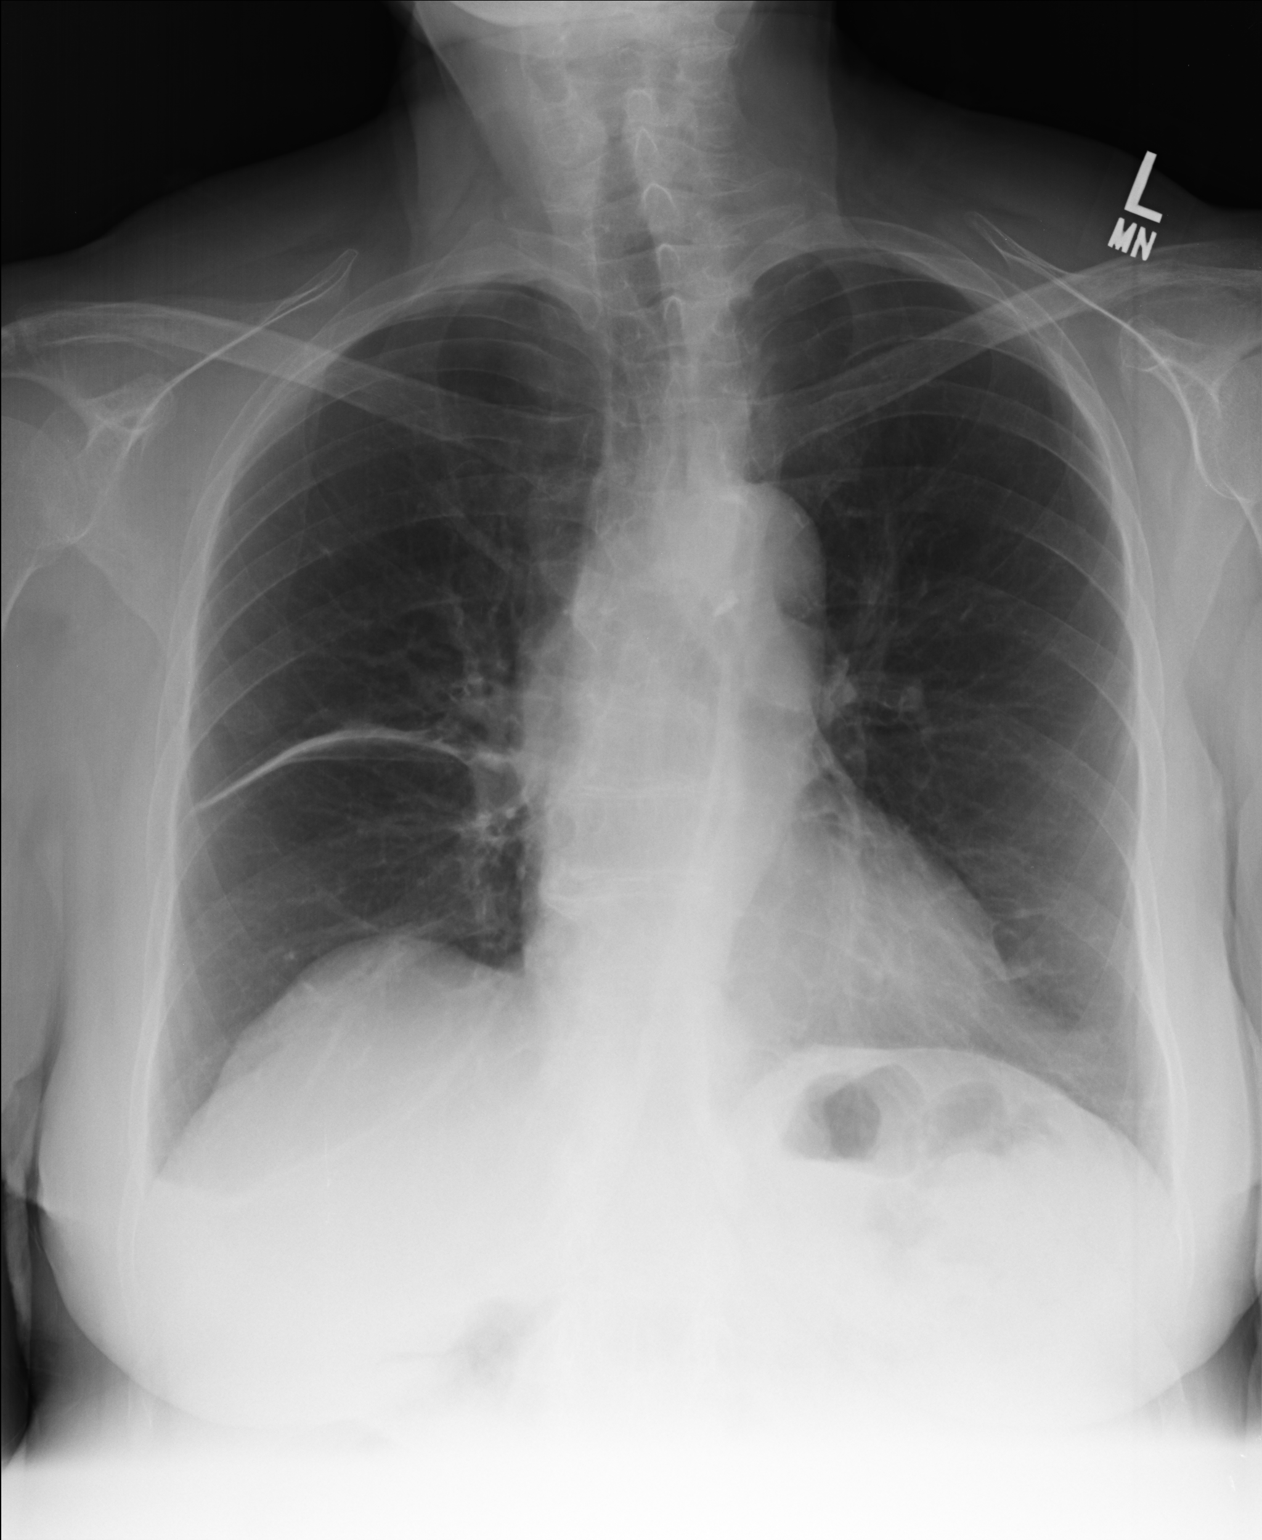

[lateral]
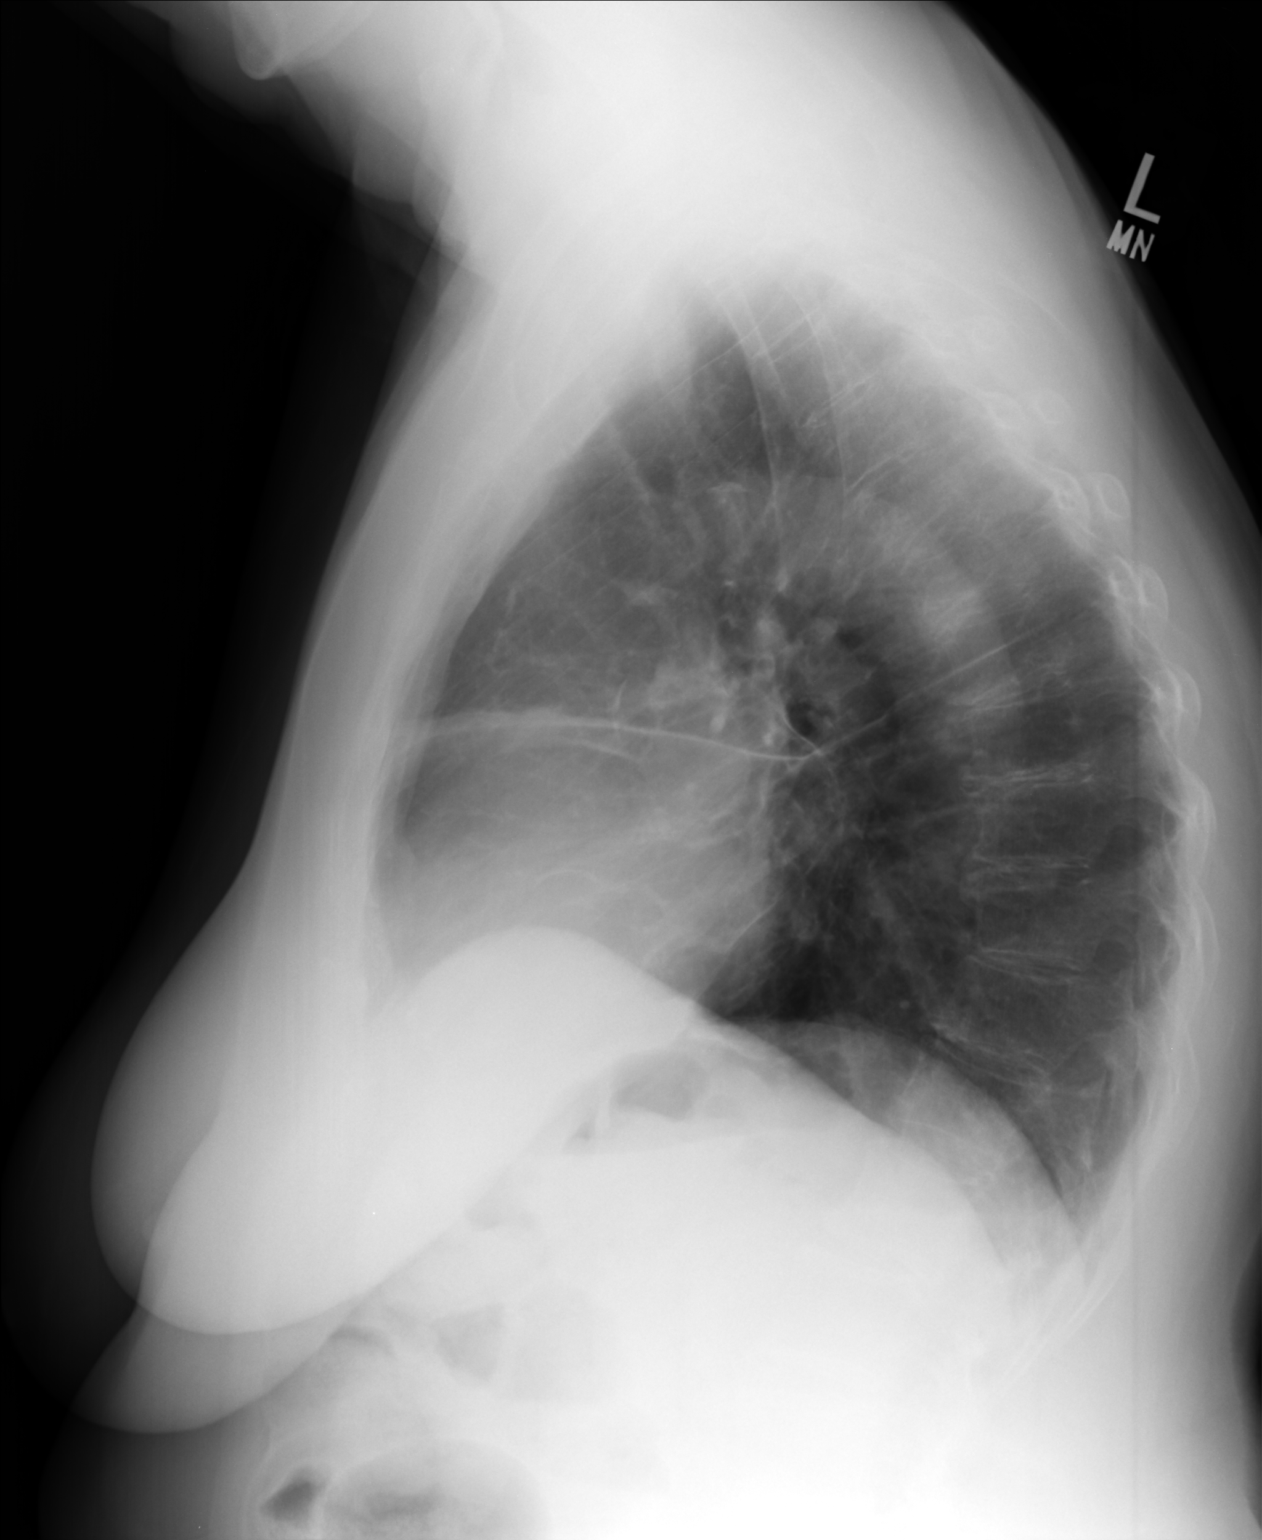

[2 of 2 positions shown; findings below may reference images not displayed]

FINDINGS: The heart size and mediastinal contours are within normal limits. No
pneumothorax or pleural effusion is noted. Left lung is clear. Fluid
is noted in the right minor fissure. Otherwise right lung is clear.
The visualized skeletal structures are unremarkable.
IMPRESSION: Small amount of fluid seen in right minor fissure. No other
abnormality seen in the chest.

## 2017-05-03 ENCOUNTER — Telehealth: Payer: Self-pay | Admitting: Internal Medicine

## 2017-05-03 NOTE — Telephone Encounter (Signed)
Left VM with Tharon Aquas giving verbal orders, FYI

## 2017-05-03 NOTE — Telephone Encounter (Unsigned)
Copied from [place] [id number]. Topic: Quick Communication - See Telephone Encounter >> May 03, 2017  1:47 PM Percell Belt A wrote: CRM for notification. See Telephone encounter for: 05/03/17.  Caryl Pina from [facility] [phone] ext [phone]  She had faxed over an order for a walker with a seat?  Have you seen fax order?   Need to be fax to [phone]

## 2017-05-04 NOTE — Telephone Encounter (Signed)
Ok Thx 

## 2017-05-07 ENCOUNTER — Encounter: Payer: Self-pay | Admitting: Internal Medicine

## 2017-05-07 ENCOUNTER — Other Ambulatory Visit (INDEPENDENT_AMBULATORY_CARE_PROVIDER_SITE_OTHER): Payer: Medicare Other

## 2017-05-07 ENCOUNTER — Ambulatory Visit (INDEPENDENT_AMBULATORY_CARE_PROVIDER_SITE_OTHER): Payer: Medicare Other | Admitting: Internal Medicine

## 2017-05-07 VITALS — BP 148/86 | HR 56 | Temp 98.0°F | Ht 64.0 in | Wt 158.0 lb

## 2017-05-07 DIAGNOSIS — R269 Unspecified abnormalities of gait and mobility: Secondary | ICD-10-CM | POA: Diagnosis not present

## 2017-05-07 DIAGNOSIS — R600 Localized edema: Secondary | ICD-10-CM | POA: Diagnosis not present

## 2017-05-07 DIAGNOSIS — Z23 Encounter for immunization: Secondary | ICD-10-CM

## 2017-05-07 DIAGNOSIS — R202 Paresthesia of skin: Secondary | ICD-10-CM | POA: Diagnosis not present

## 2017-05-07 DIAGNOSIS — E559 Vitamin D deficiency, unspecified: Secondary | ICD-10-CM

## 2017-05-07 DIAGNOSIS — E034 Atrophy of thyroid (acquired): Secondary | ICD-10-CM

## 2017-05-07 DIAGNOSIS — M353 Polymyalgia rheumatica: Secondary | ICD-10-CM | POA: Diagnosis not present

## 2017-05-07 DIAGNOSIS — E7800 Pure hypercholesterolemia, unspecified: Secondary | ICD-10-CM | POA: Diagnosis not present

## 2017-05-07 DIAGNOSIS — G43809 Other migraine, not intractable, without status migrainosus: Secondary | ICD-10-CM

## 2017-05-07 LAB — BASIC METABOLIC PANEL
BUN: 31 mg/dL — AB (ref 6–23)
CALCIUM: 9.2 mg/dL (ref 8.4–10.5)
CHLORIDE: 104 meq/L (ref 96–112)
CO2: 25 mEq/L (ref 19–32)
CREATININE: 1.42 mg/dL — AB (ref 0.40–1.20)
GFR: 37.13 mL/min — ABNORMAL LOW (ref >60.00)
GLUCOSE: 98 mg/dL (ref 70–99)
Potassium: 4.5 mEq/L (ref 3.5–5.1)
Sodium: 136 mEq/L (ref 135–145)

## 2017-05-07 LAB — CBC WITH DIFFERENTIAL/PLATELET
Basophils Absolute: 0.1 10*3/uL (ref 0.0–0.1)
Basophils Relative: 0.9 % (ref 0.0–3.0)
EOS PCT: 3.5 % (ref 0.0–5.0)
Eosinophils Absolute: 0.2 10*3/uL (ref 0.0–0.7)
HEMATOCRIT: 36.6 % (ref 36.0–46.0)
Hemoglobin: 12.3 g/dL (ref 12.0–15.0)
LYMPHS PCT: 28.1 % (ref 12.0–46.0)
Lymphs Abs: 2 10*3/uL (ref 0.7–4.0)
MCHC: 33.5 g/dL (ref 30.0–36.0)
MCV: 89.4 fl (ref 78.0–100.0)
MONOS PCT: 10.1 % (ref 3.0–12.0)
Monocytes Absolute: 0.7 10*3/uL (ref 0.1–1.0)
Neutro Abs: 4 10*3/uL (ref 1.4–7.7)
Neutrophils Relative %: 57.4 % (ref 43.0–77.0)
Platelets: 280 10*3/uL (ref 150.0–400.0)
RBC: 4.09 Mil/uL (ref 3.87–5.11)
RDW: 13.7 % (ref 11.5–15.5)
WBC: 7 10*3/uL (ref 4.0–10.5)

## 2017-05-07 LAB — URINALYSIS, ROUTINE W REFLEX MICROSCOPIC
Bilirubin Urine: NEGATIVE
HGB URINE DIPSTICK: NEGATIVE
KETONES UR: NEGATIVE
Nitrite: NEGATIVE
RBC / HPF: NONE SEEN (ref 0–?)
SPECIFIC GRAVITY, URINE: 1.01 (ref 1.000–1.030)
Total Protein, Urine: NEGATIVE
UROBILINOGEN UA: 0.2 (ref 0.0–1.0)
Urine Glucose: NEGATIVE
pH: 7 (ref 5.0–8.0)

## 2017-05-07 LAB — HEPATIC FUNCTION PANEL
ALBUMIN: 3.8 g/dL (ref 3.5–5.2)
ALT: 9 U/L (ref 0–35)
AST: 16 U/L (ref 0–37)
Alkaline Phosphatase: 58 U/L (ref 39–117)
BILIRUBIN DIRECT: 0 mg/dL (ref 0.0–0.3)
TOTAL PROTEIN: 7.6 g/dL (ref 6.0–8.3)
Total Bilirubin: 0.3 mg/dL (ref 0.2–1.2)

## 2017-05-07 NOTE — Addendum Note (Signed)
Addended by: Karren Cobble on: 05/07/2017 05:07 PM   Modules accepted: Orders

## 2017-05-07 NOTE — Assessment & Plan Note (Signed)
Levothroid °Labs °

## 2017-05-07 NOTE — Progress Notes (Signed)
Subjective:  Patient ID: Anita Banks, female    DOB: 03-Jun-1927  Age: 84 y.o. MRN: [id number]  CC: No chief complaint on file.   HPI Anita Banks presents for HAs, depression, hypothyroidism f/u C/o knee pain In PT at home  Outpatient Medications Prior to Visit  Medication Sig Dispense Refill  . aspirin 81 MG tablet Take 81 mg by mouth daily.    . Cholecalciferol (VITAMIN D) 2000 units tablet Take 2,000 Units by mouth daily.    . Coenzyme Q10 (CO Q-10) 100 MG CAPS Take 1 tablet by mouth daily. 90 each 3  . escitalopram (LEXAPRO) 5 MG tablet TAKE 1 TABLET (5 MG TOTAL) BY MOUTH DAILY. NEED ANNUAL APPOINTMENT FOR FURTHER REFILLS 90 tablet 0  . estradiol (ESTRACE) 0.1 MG/GM vaginal cream Place 2 g vaginally 2 (two) times a week.    Marland Kitchen glucosamine-chondroitin 500-400 MG tablet Take 1 tablet by mouth 2 (two) times daily. 180 tablet 3  . levothyroxine (SYNTHROID, LEVOTHROID) 112 MCG tablet Take 1 tablet (112 mcg total) by mouth daily. 90 tablet 0  . lisinopril (PRINIVIL,ZESTRIL) 10 MG tablet TAKE 1 TABLET BY MOUTH IN THE MORNING AND 2 IN THE EVENING 270 tablet 2  . LORazepam (ATIVAN) 1 MG tablet TAKE 1 TABLET AT BEDTIME MAY REPEAT 1 TIME IF NEEDED 60 tablet 2  . mirtazapine (REMERON) 15 MG tablet TAKE 1 TABLET AT BEDTIME *NEEDS OFFICE VISIT BEFORE REFILLS WILL BE GIVEN* 90 tablet 3  . Multiple Vitamins-Minerals (CENTRUM SILVER ADULT 50+) TABS Take 1 tablet by mouth daily. 90 tablet 3  . Omega-3 Fatty Acids (FISH OIL) 1200 MG CAPS Take 1 capsule (1,200 mg total) by mouth daily. 90 capsule 3  . polyethylene glycol powder (GLYCOLAX/MIRALAX) powder MIX 1 SCOOP IN LIQUID AND TAKE ONCE DAILY. 527 g 2  . propranolol (INDERAL) 20 MG tablet Take 20 mg by mouth once.     . topiramate (TOPAMAX) 50 MG tablet Take 50 mg by mouth daily.    Marland Kitchen triamcinolone cream (KENALOG) 0.1 % Apply 1 application topically 2 (two) times daily. 160 g 0  . polyethylene glycol powder (GLYCOLAX/MIRALAX) powder  MIX 1 SCOOP IN LIQUID AND TAKE ONCE DAILY. (Patient not taking: Reported on 05/07/2017) 527 g 2  . butalbital-acetaminophen-caffeine (FIORICET, ESGIC) 50-325-40 MG tablet TAKE 1 TABLET BY MOUTH TWICE A DAY AS NEEDED FOR HEADACHE 60 tablet 2   No facility-administered medications prior to visit.     ROS Review of Systems  Constitutional: Positive for fatigue. Negative for activity change, appetite change, chills, diaphoresis and unexpected weight change.  HENT: Negative for congestion, mouth sores and sinus pressure.   Eyes: Negative for visual disturbance.  Respiratory: Negative for cough and chest tightness.   Gastrointestinal: Negative for abdominal pain and nausea.  Genitourinary: Negative for difficulty urinating, frequency and vaginal pain.  Musculoskeletal: Positive for gait problem. Negative for back pain.  Skin: Negative for pallor and rash.  Neurological: Positive for dizziness and weakness. Negative for tremors, numbness and headaches.  Psychiatric/Behavioral: Negative for confusion and sleep disturbance.    Objective:  BP (!) 148/86 (BP Location: Left Arm, Patient Position: Sitting, Cuff Size: Normal)   Pulse (!) 56   Temp 98 F (36.7 C) (Oral)   Ht 5\' 4"  (1.626 m)   Wt 158 lb (71.7 kg)   SpO2 98%   BMI 27.12 kg/m   BP Readings from Last 3 Encounters:  05/07/17 (!) 148/86  08/29/16 (!) 142/82  01/12/16 136/78  Wt Readings from Last 3 Encounters:  05/07/17 158 lb (71.7 kg)  08/29/16 160 lb (72.6 kg)  01/12/16 155 lb (70.3 kg)    Physical Exam  Constitutional: She appears well-developed. No distress.  HENT:  Head: Normocephalic.  Right Ear: External ear normal.  Left Ear: External ear normal.  Nose: Nose normal.  Mouth/Throat: Oropharynx is clear and moist.  Eyes: Pupils are equal, round, and reactive to light. Conjunctivae are normal. Right eye exhibits no discharge. Left eye exhibits no discharge.  Neck: Normal range of motion. Neck supple. No JVD  present. No tracheal deviation present. No thyromegaly present.  Cardiovascular: Normal rate, regular rhythm and normal heart sounds.  Pulmonary/Chest: No stridor. No respiratory distress. She has no wheezes.  Abdominal: Soft. Bowel sounds are normal. She exhibits no distension and no mass. There is no tenderness. There is no rebound and no guarding.  Musculoskeletal: She exhibits no edema or tenderness.  Lymphadenopathy:    She has no cervical adenopathy.  Neurological: She displays normal reflexes. No cranial nerve deficit. She exhibits normal muscle tone. Coordination abnormal.  Skin: No rash noted. No erythema.  Psychiatric: She has a normal mood and affect. Her behavior is normal. Judgment and thought content normal.  ataxic  Lab Results  Component Value Date   WBC 6.2 12/20/2015   HGB 11.8 (L) 12/20/2015   HCT 34.7 (L) 12/20/2015   PLT 311.0 12/20/2015   GLUCOSE 94 12/20/2015   CHOL 254 (H) 11/28/2011   TRIG 257 (H) 11/28/2011   HDL 50 11/28/2011   LDLDIRECT 186.4 02/20/2007   LDLCALC 153 (H) 11/28/2011   ALT 11 12/20/2015   AST 16 12/20/2015   NA 139 12/20/2015   K 4.4 12/20/2015   CL 108 12/20/2015   CREATININE 1.48 (H) 12/20/2015   BUN 26 (H) 12/20/2015   CO2 23 12/20/2015   TSH 0.63 12/20/2015    Dg Chest 2 View  Result Date: 01/13/2016 CLINICAL DATA:  Cough and congestion. EXAM: CHEST  2 VIEW COMPARISON:  05/08/2015. FINDINGS: Mediastinum and hilar structures are normal. Heart size normal. Mild bibasilar subsegmental atelectasis. No focal infiltrate. Previously identified small amount of fluid in the right minor fissure is cleared. No pleural effusion or pneumothorax. IMPRESSION: 1. Mild bibasilar subsegmental atelectasis. Previously identified small amount of fluid noted in the right minor fissure has cleared. 2.  Mild cardiomegaly.  No pulmonary venous congestion . Electronically Signed   By: Marcello Moores  Register   On: 01/13/2016 07:49    Assessment & Plan:   There  are no diagnoses linked to this encounter. I have discontinued Anita Banks's butalbital-acetaminophen-caffeine. I am also having her maintain her estradiol, CENTRUM SILVER ADULT 50+, glucosamine-chondroitin, Co Q-10, Fish Oil, aspirin, topiramate, Vitamin D, propranolol, lisinopril, polyethylene glycol powder, mirtazapine, levothyroxine, polyethylene glycol powder, triamcinolone cream, LORazepam, and escitalopram.  No orders of the defined types were placed in this encounter.    Follow-up: No follow-ups on file.  Walker Kehr, MD

## 2017-05-07 NOTE — Telephone Encounter (Signed)
Order faxed.

## 2017-05-07 NOTE — Assessment & Plan Note (Signed)
PT at home [facility]

## 2017-05-07 NOTE — Assessment & Plan Note (Signed)
Labs

## 2017-05-07 NOTE — Assessment & Plan Note (Signed)
Doing well 

## 2017-05-07 NOTE — Assessment & Plan Note (Signed)
F/u w/Dr Joretta Bachelor Topamax

## 2017-05-07 NOTE — Assessment & Plan Note (Signed)
Compression socks 

## 2017-05-08 ENCOUNTER — Encounter: Payer: Self-pay | Admitting: Internal Medicine

## 2017-05-08 ENCOUNTER — Telehealth: Payer: Self-pay

## 2017-05-08 ENCOUNTER — Other Ambulatory Visit: Payer: Self-pay | Admitting: Internal Medicine

## 2017-05-08 DIAGNOSIS — G43909 Migraine, unspecified, not intractable, without status migrainosus: Secondary | ICD-10-CM | POA: Diagnosis not present

## 2017-05-08 DIAGNOSIS — R2689 Other abnormalities of gait and mobility: Secondary | ICD-10-CM | POA: Diagnosis not present

## 2017-05-08 DIAGNOSIS — M1712 Unilateral primary osteoarthritis, left knee: Secondary | ICD-10-CM | POA: Diagnosis not present

## 2017-05-08 DIAGNOSIS — K589 Irritable bowel syndrome without diarrhea: Secondary | ICD-10-CM | POA: Diagnosis not present

## 2017-05-08 DIAGNOSIS — F419 Anxiety disorder, unspecified: Secondary | ICD-10-CM | POA: Diagnosis not present

## 2017-05-08 DIAGNOSIS — F329 Major depressive disorder, single episode, unspecified: Secondary | ICD-10-CM | POA: Diagnosis not present

## 2017-05-08 DIAGNOSIS — E559 Vitamin D deficiency, unspecified: Secondary | ICD-10-CM

## 2017-05-08 DIAGNOSIS — T452X1A Poisoning by vitamins, accidental (unintentional), initial encounter: Secondary | ICD-10-CM

## 2017-05-08 LAB — VITAMIN D 25 HYDROXY (VIT D DEFICIENCY, FRACTURES): VITD: 133.25 ng/mL — AB (ref 30.00–100.00)

## 2017-05-08 LAB — VITAMIN B12

## 2017-05-08 LAB — TSH: TSH: 0.44 u[IU]/mL (ref 0.35–4.50)

## 2017-05-08 NOTE — Telephone Encounter (Signed)
CRITICAL VALUE STICKER  CRITICAL VALUE: VIT D 133.25  RECEIVER (on-site recipient of call): [place] NOTIFIED: 8:48am  MESSENGER (representative from lab): Esperanza Richters  MD NOTIFIED:   TIME OF NOTIFICATION:  RESPONSE:

## 2017-05-09 NOTE — Telephone Encounter (Signed)
addressed

## 2017-05-16 DIAGNOSIS — F329 Major depressive disorder, single episode, unspecified: Secondary | ICD-10-CM | POA: Diagnosis not present

## 2017-05-16 DIAGNOSIS — K589 Irritable bowel syndrome without diarrhea: Secondary | ICD-10-CM | POA: Diagnosis not present

## 2017-05-16 DIAGNOSIS — F419 Anxiety disorder, unspecified: Secondary | ICD-10-CM | POA: Diagnosis not present

## 2017-05-16 DIAGNOSIS — M1712 Unilateral primary osteoarthritis, left knee: Secondary | ICD-10-CM | POA: Diagnosis not present

## 2017-05-16 DIAGNOSIS — R2689 Other abnormalities of gait and mobility: Secondary | ICD-10-CM | POA: Diagnosis not present

## 2017-05-16 DIAGNOSIS — G43909 Migraine, unspecified, not intractable, without status migrainosus: Secondary | ICD-10-CM | POA: Diagnosis not present

## 2017-05-24 DIAGNOSIS — F419 Anxiety disorder, unspecified: Secondary | ICD-10-CM | POA: Diagnosis not present

## 2017-05-24 DIAGNOSIS — K589 Irritable bowel syndrome without diarrhea: Secondary | ICD-10-CM | POA: Diagnosis not present

## 2017-05-24 DIAGNOSIS — R2689 Other abnormalities of gait and mobility: Secondary | ICD-10-CM | POA: Diagnosis not present

## 2017-05-24 DIAGNOSIS — M1712 Unilateral primary osteoarthritis, left knee: Secondary | ICD-10-CM | POA: Diagnosis not present

## 2017-05-24 DIAGNOSIS — F329 Major depressive disorder, single episode, unspecified: Secondary | ICD-10-CM | POA: Diagnosis not present

## 2017-05-24 DIAGNOSIS — G43909 Migraine, unspecified, not intractable, without status migrainosus: Secondary | ICD-10-CM | POA: Diagnosis not present

## 2017-06-14 ENCOUNTER — Encounter: Payer: Self-pay | Admitting: Internal Medicine

## 2017-06-14 ENCOUNTER — Other Ambulatory Visit: Payer: Self-pay | Admitting: Internal Medicine

## 2017-06-14 MED ORDER — PROPRANOLOL HCL 20 MG PO TABS
20.0000 mg | ORAL_TABLET | Freq: Two times a day (BID) | ORAL | 11 refills | Status: DC
Start: 2017-06-14 — End: 2017-09-07

## 2017-06-14 NOTE — Telephone Encounter (Signed)
I'm sorry about the dosage- she is taking 30 mg of propranolol.  thanks

## 2017-06-27 ENCOUNTER — Ambulatory Visit (INDEPENDENT_AMBULATORY_CARE_PROVIDER_SITE_OTHER): Payer: Medicare Other | Admitting: Sports Medicine

## 2017-06-27 ENCOUNTER — Encounter: Payer: Self-pay | Admitting: Sports Medicine

## 2017-06-27 ENCOUNTER — Ambulatory Visit: Payer: Self-pay

## 2017-06-27 VITALS — BP 134/90 | HR 60 | Ht 64.0 in | Wt 159.6 lb

## 2017-06-27 DIAGNOSIS — M19049 Primary osteoarthritis, unspecified hand: Secondary | ICD-10-CM

## 2017-06-27 DIAGNOSIS — M25562 Pain in left knee: Secondary | ICD-10-CM

## 2017-06-27 DIAGNOSIS — M1712 Unilateral primary osteoarthritis, left knee: Secondary | ICD-10-CM | POA: Diagnosis not present

## 2017-06-27 DIAGNOSIS — G8929 Other chronic pain: Secondary | ICD-10-CM

## 2017-06-27 MED ORDER — DICLOFENAC SODIUM 1 % TD GEL: TRANSDERMAL | 1 refills | Status: AC

## 2017-06-27 NOTE — Progress Notes (Signed)
[name]. Sukaina Toothaker, [place] at [facility] [phone]  Anita Banks - 84 y.o. female MRN [id number]  Date of birth: 1927-08-01  Visit Date: 06/27/2017  PCP: [name], MD   Referred by: [name], MD  Scribe for today's visit: Wendy Poet, LAT, ATC     SUBJECTIVE:  Anita Banks is here for Follow-up (L knee pain) .    Pt presents today in follow-up of osteoarthritis of the left knee. She had ultrasound guided aspiration and steroid injection 06/05/2016. She was also given home therapeutic exercises. Pt is interested in receiving Monovisc injection today.   Knee pain has improved some since her last visit. It is better than before but she is still having pain. She has pain when walking. She walks every morning for about 50 minutes and she feels weakness and fatigue so she has to stop about every 15 minutes. She does have mild redness and swelling in the legs. The legs are not warm to touch. She has compression socks that she wears during the day. The knee pain is just in the left knee but the leg pain is bilateral. She has pain when going up and down stairs but pain is worse when going down stairs. When she walks around in the house she walks without assistance but when she walks outside she always has someone there to help her incase she looses or balance.    06/27/2017: Compared to the last office visit on 08/29/16, her previously described L knee pain symptoms are worsening but overall better than when she first came in for this pain. Current symptoms are moderate & are radiating to L lower leg She has been wearing compression socks and wears a knee brace when she walks.  She continues to walk outside for approximately 35 min daily.  She would like to have another injection today if possible.  ROS Report night time disturbances. Denies fevers, chills, or night sweats. Denies unexplained weight  loss. Reports personal history of cancer - skin cancer 2015 Denies changes in bowel or bladder habits. Denies recent unreported falls. Denies new or worsening dyspnea or wheezing. Reports headaches or dizziness.  Reports numbness, tingling or weakness  In the extremities - in the L arm Denies dizziness or presyncopal episodes Reports lower extremity edema    HISTORY & PERTINENT PRIOR DATA:  Prior History reviewed and updated per electronic medical record.  Significant/pertinent history, findings, studies include:  reports that she has never smoked. She has never used smokeless tobacco. No results for input(s): HGBA1C, LABURIC, CREATINE in the last 8760 hours. No specialty comments available. No problems updated.  OBJECTIVE:  VS:  HT:5\' 4"  (162.6 cm)   WT:159 lb 9.6 oz (72.4 kg)  BMI:27.38    BP:134/90  HR:60bpm  TEMP: ( )  RESP:96 %   PHYSICAL EXAM: Constitutional: WDWN, Non-toxic appearing. Psychiatric: Alert & appropriately interactive.  Not depressed or anxious appearing. Respiratory: No increased work of breathing.  Trachea Midline Eyes: Pupils are equal.  EOM intact without nystagmus.  No scleral icterus  Vascular Exam: warm to touch no edema  lower extremity neuro exam: unremarkable  MSK Exam: Generalized osteoarthritic bossing of her left knee.  Moderate effusion. Hands have some degenerative bossing of the IP joints of both hands.  There is slightly tender with no significant erythema.  Pain with axial load and circumduction of the CMC joint.   ASSESSMENT & PLAN:   1. Chronic  pain of left knee   2. Primary osteoarthritis of left knee   3. Hand arthritis     PLAN: Repeat injection performed today.  We will get her preapproved for Zilretta injections at follow-up in 8 weeks.  Can consider Monovisc injections as an alternative I think given the synovitis and small effusions Zilretta injections will be more beneficial.    Voltaren gel for the hand  arthritis.  Follow-up: Return in about 8 weeks (around 08/22/2017).     Please see additional documentation for Objective, Assessment and Plan sections. Pertinent additional documentation may be included in corresponding procedure notes, imaging studies, problem based documentation and patient instructions. Please see these sections of the encounter for additional information regarding this visit.  CMA/ATC served as Education administrator during this visit. History, Physical, and Plan performed by medical provider. Documentation and orders reviewed and attested to.      Gerda Diss, [place] [facility] Physician

## 2017-06-27 NOTE — Patient Instructions (Signed)

## 2017-06-27 NOTE — Procedures (Signed)
PROCEDURE NOTE:  Ultrasound Guided: Injection: Left knee Images were obtained and interpreted by myself, [name], DO  Images have been saved and stored to PACS system. Images obtained on: GE S7 Ultrasound machine    ULTRASOUND FINDINGS:  small effusion - marked degenerative spurring  DESCRIPTION OF PROCEDURE:  The patient's clinical condition is marked by substantial pain and/or significant functional disability. Other conservative therapy has not provided relief, is contraindicated, or not appropriate. There is a reasonable likelihood that injection will significantly improve the patient's pain and/or functional impairment.   After discussing the risks, benefits and expected outcomes of the injection and all questions were reviewed and answered, the patient wished to undergo the above named procedure.  Verbal consent was obtained.  The ultrasound was used to identify the target structure and adjacent neurovascular structures. The skin was then prepped in sterile fashion and the target structure was injected under direct visualization using sterile technique as below:  PREP: Alcohol and Ethel Chloride APPROACH: superiolateral, single injection, 21g 2 in. INJECTATE: 3 cc 0.5% Marcaine and 1 cc 40mg /mL DepoMedrol ASPIRATE: None DRESSING: Band-Aid  Post procedural instructions including recommending icing and warning signs for infection were reviewed.    This procedure was well tolerated and there were no complications.   IMPRESSION: Succesful Ultrasound Guided: Injection

## 2017-06-28 ENCOUNTER — Other Ambulatory Visit: Payer: Self-pay | Admitting: Internal Medicine

## 2017-06-29 ENCOUNTER — Other Ambulatory Visit: Payer: Self-pay | Admitting: Internal Medicine

## 2017-07-08 ENCOUNTER — Encounter: Payer: Self-pay | Admitting: Sports Medicine

## 2017-07-17 ENCOUNTER — Other Ambulatory Visit: Payer: Self-pay | Admitting: Internal Medicine

## 2017-08-06 DIAGNOSIS — N8111 Cystocele, midline: Secondary | ICD-10-CM | POA: Diagnosis not present

## 2017-08-06 DIAGNOSIS — N816 Rectocele: Secondary | ICD-10-CM | POA: Diagnosis not present

## 2017-08-06 DIAGNOSIS — Z1231 Encounter for screening mammogram for malignant neoplasm of breast: Secondary | ICD-10-CM | POA: Diagnosis not present

## 2017-08-07 ENCOUNTER — Ambulatory Visit (INDEPENDENT_AMBULATORY_CARE_PROVIDER_SITE_OTHER)
Admission: RE | Admit: 2017-08-07 | Discharge: 2017-08-07 | Disposition: A | Discharge status: Home / Self Care | Payer: Medicare Other | Source: Ambulatory Visit | Attending: Internal Medicine | Admitting: Internal Medicine

## 2017-08-07 ENCOUNTER — Encounter: Payer: Self-pay | Admitting: Internal Medicine

## 2017-08-07 ENCOUNTER — Ambulatory Visit (INDEPENDENT_AMBULATORY_CARE_PROVIDER_SITE_OTHER): Payer: Medicare Other | Admitting: Internal Medicine

## 2017-08-07 VITALS — BP 138/90 | HR 67 | Temp 98.2°F | Ht 64.0 in | Wt 160.0 lb

## 2017-08-07 DIAGNOSIS — R05 Cough: Secondary | ICD-10-CM | POA: Diagnosis not present

## 2017-08-07 DIAGNOSIS — J069 Acute upper respiratory infection, unspecified: Secondary | ICD-10-CM

## 2017-08-07 DIAGNOSIS — R21 Rash and other nonspecific skin eruption: Secondary | ICD-10-CM

## 2017-08-07 DIAGNOSIS — T50905S Adverse effect of unspecified drugs, medicaments and biological substances, sequela: Secondary | ICD-10-CM | POA: Diagnosis not present

## 2017-08-07 DIAGNOSIS — T50905A Adverse effect of unspecified drugs, medicaments and biological substances, initial encounter: Secondary | ICD-10-CM | POA: Insufficient documentation

## 2017-08-07 DIAGNOSIS — R059 Cough, unspecified: Secondary | ICD-10-CM

## 2017-08-07 MED ORDER — PROMETHAZINE-CODEINE 6.25-10 MG/5ML PO SYRP: 5.0000 mL | ORAL_SOLUTION | ORAL | 0 refills | Status: DC | PRN

## 2017-08-07 NOTE — Progress Notes (Signed)
Subjective:  Patient ID: Anita Banks, female    DOB: 07-04-1927  Age: 84 y.o. MRN: [id number]  CC: No chief complaint on file.   HPI Anita Banks presents for URI sx's 2 weeks Low grade fever - high for the pt. We need to discuss allergies to abx - what can she take  Outpatient Medications Prior to Visit  Medication Sig Dispense Refill  . aspirin 81 MG tablet Take 81 mg by mouth daily.    . Cholecalciferol (VITAMIN D) 2000 units tablet Take 2,000 Units by mouth daily.    . Coenzyme Q10 (CO Q-10) 100 MG CAPS Take 1 tablet by mouth daily. 90 each 3  . diclofenac sodium (VOLTAREN) 1 % GEL Apply topically to affected area qid 100 g 1  . escitalopram (LEXAPRO) 5 MG tablet TAKE 1 TABLET (5 MG TOTAL) BY MOUTH DAILY. NEED ANNUAL APPOINTMENT FOR FURTHER REFILLS 90 tablet 1  . estradiol (ESTRACE) 0.1 MG/GM vaginal cream Place 2 g vaginally 2 (two) times a week.    . Fremanezumab-vfrm (AJOVY) 225 MG/1.5ML SOSY Inject 225 mg into the skin every 30 (thirty) days.    Marland Kitchen glucosamine-chondroitin 500-400 MG tablet Take 1 tablet by mouth 2 (two) times daily. 180 tablet 3  . levothyroxine (SYNTHROID, LEVOTHROID) 112 MCG tablet TAKE 1 TABLET BY MOUTH EVERY DAY 90 tablet 0  . lisinopril (PRINIVIL,ZESTRIL) 10 MG tablet TAKE 1 TABLET BY MOUTH IN THE MORNING AND 2 IN THE EVENING 270 tablet 2  . lisinopril (PRINIVIL,ZESTRIL) 20 MG tablet Take 20 mg by mouth daily.    Marland Kitchen LORazepam (ATIVAN) 1 MG tablet TAKE 1 TABLET AT BEDTIME MAY REPEAT 1 TIME IF NEEDED 60 tablet 2  . mirtazapine (REMERON) 15 MG tablet TAKE 1 TABLET AT BEDTIME *NEEDS OFFICE VISIT BEFORE REFILLS WILL BE GIVEN* 90 tablet 3  . Multiple Vitamins-Minerals (CENTRUM SILVER ADULT 50+) TABS Take 1 tablet by mouth daily. 90 tablet 3  . Omega-3 Fatty Acids (FISH OIL) 1200 MG CAPS Take 1 capsule (1,200 mg total) by mouth daily. 90 capsule 3  . polyethylene glycol powder (GLYCOLAX/MIRALAX) powder MIX 1 SCOOP IN LIQUID AND TAKE ONCE DAILY. 527 g  2  . propranolol (INDERAL) 20 MG tablet Take 1 tablet (20 mg total) by mouth 2 (two) times daily. 60 tablet 11  . topiramate (TOPAMAX) 50 MG tablet Take 50 mg by mouth daily.    Marland Kitchen triamcinolone cream (KENALOG) 0.1 % Apply 1 application topically 2 (two) times daily. 160 g 0  . polyethylene glycol powder (GLYCOLAX/MIRALAX) powder MIX 1 SCOOP IN LIQUID AND TAKE ONCE DAILY. (Patient not taking: Reported on 08/07/2017) 527 g 2   No facility-administered medications prior to visit.     ROS: Review of Systems  Constitutional: Positive for fever. Negative for activity change, appetite change, chills, fatigue and unexpected weight change.  HENT: Negative for congestion, mouth sores, sinus pressure and sore throat.   Eyes: Negative for visual disturbance.  Respiratory: Positive for cough. Negative for chest tightness, shortness of breath and wheezing.   Gastrointestinal: Negative for abdominal pain and nausea.  Genitourinary: Negative for difficulty urinating, frequency and vaginal pain.  Musculoskeletal: Positive for arthralgias. Negative for back pain and gait problem.  Skin: Negative for pallor and rash.  Neurological: Negative for dizziness, tremors, weakness, numbness and headaches.  Psychiatric/Behavioral: Negative for confusion and sleep disturbance.    Objective:  Ht 5\' 4"  (1.626 m)   Wt 160 lb (72.6 kg)   BMI 27.46 kg/m  BP Readings from Last 3 Encounters:  06/27/17 134/90  05/07/17 (!) 148/86  08/29/16 (!) 142/82    Wt Readings from Last 3 Encounters:  08/07/17 160 lb (72.6 kg)  06/27/17 159 lb 9.6 oz (72.4 kg)  05/07/17 158 lb (71.7 kg)    Physical Exam  Constitutional: She appears well-developed. No distress.  HENT:  Head: Normocephalic.  Right Ear: External ear normal.  Left Ear: External ear normal.  Nose: Nose normal.  Mouth/Throat: Oropharynx is clear and moist.  Eyes: Pupils are equal, round, and reactive to light. Conjunctivae are normal. Right eye exhibits no  discharge. Left eye exhibits no discharge.  Neck: Normal range of motion. Neck supple. No JVD present. No tracheal deviation present. No thyromegaly present.  Cardiovascular: Normal rate, regular rhythm and normal heart sounds.  Pulmonary/Chest: No stridor. No respiratory distress. She has no wheezes.  Abdominal: Soft. Bowel sounds are normal. She exhibits no distension and no mass. There is no tenderness. There is no rebound and no guarding.  Musculoskeletal: She exhibits no edema or tenderness.  Lymphadenopathy:    She has no cervical adenopathy.  Neurological: She displays normal reflexes. No cranial nerve deficit. She exhibits normal muscle tone. Coordination normal.  Skin: No rash noted. No erythema.  Psychiatric: She has a normal mood and affect. Her behavior is normal. Judgment and thought content normal.  eryth throat  Lab Results  Component Value Date   WBC 7.0 05/07/2017   HGB 12.3 05/07/2017   HCT 36.6 05/07/2017   PLT 280.0 05/07/2017   GLUCOSE 98 05/07/2017   CHOL 254 (H) 11/28/2011   TRIG 257 (H) 11/28/2011   HDL 50 11/28/2011   LDLDIRECT 186.4 02/20/2007   LDLCALC 153 (H) 11/28/2011   ALT 9 05/07/2017   AST 16 05/07/2017   NA 136 05/07/2017   K 4.5 05/07/2017   CL 104 05/07/2017   CREATININE 1.42 (H) 05/07/2017   BUN 31 (H) 05/07/2017   CO2 25 05/07/2017   TSH 0.44 05/07/2017    Dg Chest 2 View  Result Date: 01/13/2016 CLINICAL DATA:  Cough and congestion. EXAM: CHEST  2 VIEW COMPARISON:  05/08/2015. FINDINGS: Mediastinum and hilar structures are normal. Heart size normal. Mild bibasilar subsegmental atelectasis. No focal infiltrate. Previously identified small amount of fluid in the right minor fissure is cleared. No pleural effusion or pneumothorax. IMPRESSION: 1. Mild bibasilar subsegmental atelectasis. Previously identified small amount of fluid noted in the right minor fissure has cleared. 2.  Mild cardiomegaly.  No pulmonary venous congestion .  Electronically Signed   By: Marcello Moores  Register   On: 01/13/2016 07:49    Assessment & Plan:   There are no diagnoses linked to this encounter.   No orders of the defined types were placed in this encounter.    Follow-up: No follow-ups on file.  Walker Kehr, MD

## 2017-08-07 NOTE — Assessment & Plan Note (Signed)
May be able to use a Zpac Cephalosporins, Flagyl were tolerated in the past ok

## 2017-08-07 NOTE — Assessment & Plan Note (Addendum)
Per pt report - It is unclear if a little rash on the leg was related to Z pac or not. May be able to repeat Z pac if needed

## 2017-08-07 NOTE — Patient Instructions (Signed)
You can use over-the-counter  "cold" medicines  such as  " Delsym" or" Robitussin" cough syrup varietis for cough.  You can use plain "Tylenol" or "Advil" for fever, chills and achyness. Use Halls or Ricola cough drops.     Please, make an appointment if you are not better or if you're worse.  

## 2017-08-07 NOTE — Assessment & Plan Note (Signed)
CXR Prom-cod syr

## 2017-08-08 ENCOUNTER — Encounter: Payer: Self-pay | Admitting: Internal Medicine

## 2017-08-08 ENCOUNTER — Other Ambulatory Visit: Payer: Self-pay | Admitting: Internal Medicine

## 2017-08-08 MED ORDER — CEFUROXIME AXETIL 250 MG PO TABS: 250.0000 mg | ORAL_TABLET | Freq: Two times a day (BID) | ORAL | 0 refills | Status: AC

## 2017-08-23 ENCOUNTER — Ambulatory Visit (INDEPENDENT_AMBULATORY_CARE_PROVIDER_SITE_OTHER): Payer: Medicare Other | Admitting: Sports Medicine

## 2017-08-23 ENCOUNTER — Ambulatory Visit: Payer: Self-pay

## 2017-08-23 ENCOUNTER — Encounter: Payer: Self-pay | Admitting: Sports Medicine

## 2017-08-23 VITALS — BP 136/84 | HR 60 | Ht 64.0 in | Wt 161.0 lb

## 2017-08-23 DIAGNOSIS — M25562 Pain in left knee: Secondary | ICD-10-CM | POA: Diagnosis not present

## 2017-08-23 DIAGNOSIS — G8929 Other chronic pain: Secondary | ICD-10-CM

## 2017-08-23 DIAGNOSIS — M1712 Unilateral primary osteoarthritis, left knee: Secondary | ICD-10-CM

## 2017-08-23 NOTE — Progress Notes (Signed)
Anita Banks. Anita Banks, Anita Banks - 84 y.o. female MRN [id number]  Date of birth: 09-12-1927  Visit Date: 08/23/2017  PCP: Anita Anger, MD   Referred by: Anita Anger, MD  Scribe(s) for today's visit: Josepha Pigg, CMA  SUBJECTIVE:  Anita Banks is here for Follow-up (L knee)   08/29/2016:   Pt presents today in follow-up of osteoarthritis of the left knee. She had ultrasound guided aspiration and steroid injection 06/05/2016. She was also given home therapeutic exercises. Pt is interested in receiving Monovisc injection today.    Knee pain has improved some since her last visit. It is better than before but she is still having pain. She has pain when walking. She walks every morning for about 50 minutes and she feels weakness and fatigue so she has to stop about every 15 minutes. She does have mild redness and swelling in the legs. The legs are not warm to touch. She has compression socks that she wears during the day. The knee pain is just in the left knee but the leg pain is bilateral. She has pain when going up and down stairs but pain is worse when going down stairs. When she walks around in the house she walks without assistance but when she walks outside she always has someone there to help her incase she looses or balance.   06/27/2017: Compared to the last office visit on 08/29/16, her previously described L knee pain symptoms are worsening but overall better than when she first came in for this pain. Current symptoms are moderate & are radiating to L lower leg She has been wearing compression socks and wears a knee brace when she walks.  She continues to walk outside for approximately 35 min daily.  She would like to have another injection today if possible.  08/23/2017: Compared to the last office visit, her previously described symptoms are improving. She still has pain  when walking but pain is less severe.  Current symptoms are mild & are radiating to the medial lower L leg.  She has been wearing compression socks and knee brace when walking. She has been walking about 40 minutes in the morning, inside. She is not currently taking any medication for knee pain.   Last steroid inj: 06/27/2017, Monovisc inj 08/29/2016. Last XR L Knee 2003   REVIEW OF SYSTEMS: Denies night time disturbances. Denies fevers, chills, or night sweats. Denies unexplained weight loss. Reports personal history of cancer. Denies changes in bowel or bladder habits. Denies recent unreported falls. Denies new or worsening dyspnea or wheezing. Reports headaches and dizziness - improving.  Denies numbness, tingling or weakness  In the extremities.  Reports dizziness or presyncopal episodes Reports lower extremity edema    HISTORY & PERTINENT PRIOR DATA:  Prior History reviewed and updated per electronic medical record.  Significant/pertinent history, findings, studies include:  reports that she has never smoked. She has never used smokeless tobacco. No results for input(s): HGBA1C, LABURIC, CREATINE in the last 8760 hours. No specialty comments available. No problems updated.  OBJECTIVE:  VS:  HT:5\' 4"  (162.6 cm)   WT:161 lb (73 kg)  BMI:27.62    BP:136/84  HR:60bpm  TEMP: ( )  RESP:97 %   PHYSICAL EXAM: Constitutional: WDWN, Non-toxic appearing. Psychiatric: Alert & appropriately interactive.  Not depressed or anxious appearing. Respiratory: No increased work of breathing.  Trachea Midline Eyes: Pupils are equal.  EOM intact without nystagmus.  No scleral icterus  Vascular Exam: warm to touch no edema  lower extremity neuro exam: unremarkable  MSK Exam: Generalized osteophytic bossing of the left knee.  Small joint effusion.  Ligamentously stable.  Moderate synovitis.   ASSESSMENT & PLAN:   1. Chronic pain of left knee   2. Primary osteoarthritis of left knee      PLAN: Zilretta injection performed today.  Follow-up in 12 to 13 weeks and can consider Monovisc injection at that time.  Follow-up: Return in about 3 months (around 11/22/2017).      Please see additional documentation for Objective, Assessment and Plan sections. Pertinent additional documentation may be included in corresponding procedure notes, imaging studies, problem based documentation and patient instructions. Please see these sections of the encounter for additional information regarding this visit.  CMA/ATC served as Education administrator during this visit. History, Physical, and Plan performed by medical provider. Documentation and orders reviewed and attested to.      Gerda Diss, [facility] Physician

## 2017-08-23 NOTE — Procedures (Signed)
PROCEDURE NOTE:  Ultrasound Guided: Injection: Left knee Images were obtained and interpreted by myself, [name], DO  Images have been saved and stored to PACS system. Images obtained on: GE S7 Ultrasound machine    ULTRASOUND FINDINGS:  minimal effusion, marked degenerative changes  DESCRIPTION OF PROCEDURE:  The patient's clinical condition is marked by substantial pain and/or significant functional disability. Other conservative therapy has not provided relief, is contraindicated, or not appropriate. There is a reasonable likelihood that injection will significantly improve the patient's pain and/or functional impairment.   After discussing the risks, benefits and expected outcomes of the injection and all questions were reviewed and answered, the patient wished to undergo the above named procedure.  Verbal consent was obtained.  The ultrasound was used to identify the target structure and adjacent neurovascular structures. The skin was then prepped in sterile fashion and the target structure was injected under direct visualization using sterile technique as below:  Single injection performed as below: PREP: Alcohol and Ethel Chloride APPROACH:superiolateral, single injection, 21g 2 in. INJECTATE: 5 cc Zilretta (32mg  triamcinolone sustained release) ASPIRATE: None DRESSING: Band-Aid  Post procedural instructions including recommending icing and warning signs for infection were reviewed.    This procedure was well tolerated and there were no complications.   IMPRESSION: Succesful Ultrasound Guided: Injection

## 2017-08-23 NOTE — Patient Instructions (Signed)

## 2017-09-06 DIAGNOSIS — N8111 Cystocele, midline: Secondary | ICD-10-CM | POA: Diagnosis not present

## 2017-09-06 DIAGNOSIS — N816 Rectocele: Secondary | ICD-10-CM | POA: Diagnosis not present

## 2017-09-06 DIAGNOSIS — R300 Dysuria: Secondary | ICD-10-CM | POA: Diagnosis not present

## 2017-09-06 DIAGNOSIS — N898 Other specified noninflammatory disorders of vagina: Secondary | ICD-10-CM | POA: Diagnosis not present

## 2017-09-07 ENCOUNTER — Encounter: Payer: Self-pay | Admitting: Internal Medicine

## 2017-09-07 MED ORDER — PROPRANOLOL HCL 20 MG PO TABS: 20.0000 mg | ORAL_TABLET | Freq: Two times a day (BID) | ORAL | 11 refills | Status: DC

## 2017-09-17 DIAGNOSIS — N8111 Cystocele, midline: Secondary | ICD-10-CM | POA: Diagnosis not present

## 2017-09-17 DIAGNOSIS — N816 Rectocele: Secondary | ICD-10-CM | POA: Diagnosis not present

## 2017-09-24 ENCOUNTER — Other Ambulatory Visit: Payer: Self-pay

## 2017-09-24 ENCOUNTER — Other Ambulatory Visit: Payer: Self-pay | Admitting: Internal Medicine

## 2017-09-25 MED ORDER — LEVOTHYROXINE SODIUM 112 MCG PO TABS: 112.0000 ug | ORAL_TABLET | Freq: Every day | ORAL | 0 refills | Status: DC

## 2017-10-18 DIAGNOSIS — I10 Essential (primary) hypertension: Secondary | ICD-10-CM | POA: Diagnosis not present

## 2017-10-18 DIAGNOSIS — G43009 Migraine without aura, not intractable, without status migrainosus: Secondary | ICD-10-CM | POA: Diagnosis not present

## 2017-10-29 ENCOUNTER — Encounter (HOSPITAL_COMMUNITY): Payer: Self-pay | Admitting: Emergency Medicine

## 2017-10-29 ENCOUNTER — Ambulatory Visit (HOSPITAL_COMMUNITY)
Admission: EM | Admit: 2017-10-29 | Discharge: 2017-10-29 | Disposition: A | Discharge status: Home / Self Care | Payer: Medicare Other | Attending: Family Medicine | Admitting: Family Medicine

## 2017-10-29 DIAGNOSIS — K529 Noninfective gastroenteritis and colitis, unspecified: Secondary | ICD-10-CM

## 2017-10-29 MED ORDER — SUCRALFATE 1 GM/10ML PO SUSP: 1.0000 g | Freq: Three times a day (TID) | ORAL | 0 refills | Status: DC

## 2017-10-29 NOTE — ED Triage Notes (Signed)
Per family member, pt had a party with friends Saturday and ate some bad food, pt states shes been vomiting since Saturday night, diarrhea Sunday. Pt stopped vomiting last night. Pt c/o weakness, family member states shes been someone dizzy. Pt states shes been drinking a lot of liquids today. Feels dehydrated.

## 2017-10-29 NOTE — ED Provider Notes (Signed)
[facility]    CSN: [id number] Arrival date & time: 10/29/17  1303     History   Chief Complaint Chief Complaint  Patient presents with  . Emesis  . Diarrhea    HPI Anita Banks is a 84 y.o. female.   Per daughter, pt had a party with friends Saturday and ate some bad food, pt states shes been vomiting since Saturday night, diarrhea Sunday. Pt stopped vomiting last night. Pt c/o weakness, family member states shes been someone dizzy. Pt states shes been drinking a lot of liquids today. Feels dehydrated.   Patient has been quite weak for couple days but she is tolerating fluids well.  She is no longer nauseated and the diarrhea has subsided as well.  Patient still has some mild epigastric muscular soreness from vomiting, some difficulty swallowing following the vomiting, and some upper back pain.  There is been no fever, blood in emesis, or blood in diarrhea.     Past Medical History:  Diagnosis Date  . Anxiety   . Arthritis   . Hypertension   . Migraine     Patient Active Problem List   Diagnosis Date Noted  . Polymyalgia rheumatica ([place]) 04/20/2011    Priority: High  . Depression with anxiety 02/20/2007    Priority: High  . Drug reaction 08/07/2017  . Lower extremity edema 08/29/2016  . Cough 01/12/2016  . Wheezing 01/12/2016  . Occipital headache 12/24/2015  . Chest pain, atypical 09/13/2015  . Acute upper respiratory infection 04/29/2015  . Pruritic condition 01/18/2015  . Actinic keratoses 09/13/2014  . Gait disorder 09/04/2014  . Rash and nonspecific skin eruption 09/04/2014  . INSOMNIA, PERSISTENT 06/26/2007  . HYPERCHOLESTEROLEMIA 02/20/2007  . Hypothyroidism due to acquired atrophy of thyroid 12/03/2006  . Osteoarthritis of left knee 12/03/2006  . OSTEOPENIA 12/03/2006  . Migraine variant 11/26/2006  . IRRITABLE BOWEL SYNDROME 11/26/2006    Past Surgical History:  Procedure Laterality Date  . ABDOMINAL HYSTERECTOMY    .  hemhorroid  2003    OB History   None      Home Medications    Prior to Admission medications   Medication Sig Start Date End Date Taking? Authorizing Provider  aspirin 81 MG tablet Take 81 mg by mouth daily.    [provider]  Cholecalciferol (VITAMIN D) 2000 units tablet Take 2,000 Units by mouth daily.    [provider]  Coenzyme Q10 (CO Q-10) 100 MG CAPS Take 1 tablet by mouth daily. 01/09/13   Robyn Haber, MD  diclofenac sodium (VOLTAREN) 1 % GEL Apply topically to affected area qid 06/27/17   Gerda Diss, DO  escitalopram (LEXAPRO) 5 MG tablet TAKE 1 TABLET (5 MG TOTAL) BY MOUTH DAILY. NEED ANNUAL APPOINTMENT FOR FURTHER REFILLS 07/18/17   Plotnikov, Evie Lacks, MD  estradiol (ESTRACE) 0.1 MG/GM vaginal cream Place 2 g vaginally 2 (two) times a week.    [provider]  Fremanezumab-vfrm (AJOVY) 225 MG/1.5ML SOSY Inject 225 mg into the skin every 30 (thirty) days.    [provider]  glucosamine-chondroitin 500-400 MG tablet Take 1 tablet by mouth 2 (two) times daily. 01/09/13   Robyn Haber, MD  levothyroxine (SYNTHROID, LEVOTHROID) 112 MCG tablet Take 1 tablet (112 mcg total) by mouth daily. 09/25/17   Plotnikov, Evie Lacks, MD  lisinopril (PRINIVIL,ZESTRIL) 10 MG tablet TAKE 1 TABLET BY MOUTH IN THE MORNING AND 2 IN THE EVENING 06/28/17   Plotnikov, Evie Lacks, MD  lisinopril (  PRINIVIL,ZESTRIL) 20 MG tablet Take 20 mg by mouth daily.    [provider]  LORazepam (ATIVAN) 1 MG tablet TAKE 1 TABLET AT BEDTIME MAY REPEAT 1 TIME IF NEEDED 04/23/17   Plotnikov, Evie Lacks, MD  mirtazapine (REMERON) 15 MG tablet TAKE 1 TABLET AT BEDTIME *NEEDS OFFICE VISIT BEFORE REFILLS WILL BE GIVEN* 01/29/17   Plotnikov, Evie Lacks, MD  Multiple Vitamins-Minerals (CENTRUM SILVER ADULT 50+) TABS Take 1 tablet by mouth daily. 01/09/13   Robyn Haber, MD  Omega-3 Fatty Acids (FISH OIL) 1200 MG CAPS Take 1 capsule (1,200 mg total) by mouth daily.  01/09/13   Robyn Haber, MD  polyethylene glycol powder (GLYCOLAX/MIRALAX) powder MIX 1 SCOOP IN LIQUID AND TAKE ONCE DAILY. 10/10/16   Plotnikov, Evie Lacks, MD  propranolol (INDERAL) 20 MG tablet Take 1 tablet (20 mg total) by mouth 2 (two) times daily. 09/07/17   Plotnikov, Evie Lacks, MD  sucralfate (CARAFATE) 1 GM/10ML suspension Take 10 mLs (1 g total) by mouth 4 (four) times daily -  with meals and at bedtime. 10/29/17   Robyn Haber, MD  topiramate (TOPAMAX) 50 MG tablet Take 50 mg by mouth daily.    [provider]  triamcinolone cream (KENALOG) 0.1 % Apply 1 application topically 2 (two) times daily. 03/08/17   Plotnikov, Evie Lacks, MD    Family History History reviewed. No pertinent family history.  Social History Social History   Tobacco Use  . Smoking status: Never Smoker  . Smokeless tobacco: Never Used  Substance Use Topics  . Alcohol use: Not on file  . Drug use: No     Allergies   Amitriptyline hcl; Azithromycin; Doxycycline; Erythromycin; Metronidazole; Penicillins; Rizatriptan benzoate; Rosuvastatin; and Tetracyclines & related   Review of Systems Review of Systems  Gastrointestinal: Positive for diarrhea and vomiting. Negative for abdominal pain.     Physical Exam Triage Vital Signs ED Triage Vitals [10/29/17 1312]  Enc Vitals Group     BP (!) 155/85     Pulse Rate 60     Resp 18     Temp 97.9 F (36.6 C)     Temp Source Oral     SpO2 100 %     Weight      Height      Head Circumference      Peak Flow      Pain Score      Pain Loc      Pain Edu?      Excl. in [place]?    No data found.  Updated Vital Signs BP (!) 155/85   Pulse 60   Temp 97.9 F (36.6 C) (Oral)   Resp 18   SpO2 100%   Visual Acuity Right Eye Distance:   Left Eye Distance:   Bilateral Distance:    Right Eye Near:   Left Eye Near:    Bilateral Near:     Physical Exam  Constitutional: She appears well-developed and well-nourished. No distress.  HENT:    Right Ear: External ear normal.  Left Ear: External ear normal.  Mouth/Throat: Oropharynx is clear and moist.  Eyes: Pupils are equal, round, and reactive to light. Conjunctivae are normal.  Neck: Normal range of motion. Neck supple.  Cardiovascular: Regular rhythm and normal heart sounds.  Bradycardic  Pulmonary/Chest: Effort normal and breath sounds normal.  Abdominal: Soft. Bowel sounds are normal. There is no tenderness.  Musculoskeletal: Normal range of motion.  Neurological:  Slow gait, assisted by daughter  Skin:  Skin is warm and dry.  Nursing note and vitals reviewed.    UC Treatments / Results  Labs (all labs ordered are listed, but only abnormal results are displayed) Labs Reviewed - No data to display  EKG None  Radiology No results found.  Procedures Procedures (including critical care time)  Medications Ordered in UC Medications - No data to display  Initial Impression / Assessment and Plan / UC Course  I have reviewed the triage vital signs and the nursing notes.  Pertinent labs & imaging results that were available during my care of the patient were reviewed by me and considered in my medical decision making (see chart for details).    Final Clinical Impressions(s) / UC Diagnoses   Final diagnoses:  Gastroenteritis   Discharge Instructions   None    ED Prescriptions    Medication Sig Dispense Auth. Provider   sucralfate (CARAFATE) 1 GM/10ML suspension Take 10 mLs (1 g total) by mouth 4 (four) times daily -  with meals and at bedtime. 420 mL Robyn Haber, MD     Controlled Substance Prescriptions [place] Controlled Substance Registry consulted? Not Applicable   Robyn Haber, MD 10/29/17 1521

## 2017-11-05 NOTE — Telephone Encounter (Signed)
LVM for daughter to call our office and set up an appointment for the patient.

## 2017-11-17 DIAGNOSIS — Z23 Encounter for immunization: Secondary | ICD-10-CM | POA: Diagnosis not present

## 2017-11-19 ENCOUNTER — Encounter: Payer: Self-pay | Admitting: Sports Medicine

## 2017-11-19 ENCOUNTER — Ambulatory Visit: Payer: Self-pay

## 2017-11-19 ENCOUNTER — Ambulatory Visit (INDEPENDENT_AMBULATORY_CARE_PROVIDER_SITE_OTHER): Payer: Medicare Other | Admitting: Sports Medicine

## 2017-11-19 VITALS — BP 130/82 | HR 61 | Ht 64.0 in | Wt 160.0 lb

## 2017-11-19 DIAGNOSIS — G8929 Other chronic pain: Secondary | ICD-10-CM | POA: Diagnosis not present

## 2017-11-19 DIAGNOSIS — M1712 Unilateral primary osteoarthritis, left knee: Secondary | ICD-10-CM

## 2017-11-19 DIAGNOSIS — M25562 Pain in left knee: Secondary | ICD-10-CM | POA: Diagnosis not present

## 2017-11-19 NOTE — Progress Notes (Signed)
Anita Banks. Anita Banks, [name] - 84 y.o. female MRN [id number]  Date of birth: 07/31/1927  Visit Date: 11/19/2017  PCP: [name], MD   Referred by: [name], MD  Scribe(s) for today's visit: Wendy Poet, LAT, ATC  SUBJECTIVE:  Anita Banks is here for Follow-up (L knee pain)   08/29/2016:   Pt presents today in follow-up of osteoarthritis of the left knee. She had ultrasound guided aspiration and steroid injection 06/05/2016. She was also given home therapeutic exercises. Pt is interested in receiving Monovisc injection today.    Knee pain has improved some since her last visit. It is better than before but she is still having pain. She has pain when walking. She walks every morning for about 50 minutes and she feels weakness and fatigue so she has to stop about every 15 minutes. She does have mild redness and swelling in the legs. The legs are not warm to touch. She has compression socks that she wears during the day. The knee pain is just in the left knee but the leg pain is bilateral. She has pain when going up and down stairs but pain is worse when going down stairs. When she walks around in the house she walks without assistance but when she walks outside she always has someone there to help her incase she looses or balance.   06/27/2017: Compared to the last office visit on 08/29/16, her previously described L knee pain symptoms are worsening but overall better than when she first came in for this pain. Current symptoms are moderate & are radiating to L lower leg She has been wearing compression socks and wears a knee brace when she walks.  She continues to walk outside for approximately 35 min daily.  She would like to have another injection today if possible.  08/23/2017: Compared to the last office visit, her previously described symptoms are improving. She still  has pain when walking but pain is less severe.  Current symptoms are mild & are radiating to the medial lower L leg.  She has been wearing compression socks and knee brace when walking. She has been walking about 40 minutes in the morning, inside. She is not currently taking any medication for knee pain.   Last steroid inj: 06/27/2017, Monovisc inj 08/29/2016. Last XR L Knee 2003  11/19/2017: Compared to the last office visit on 08/23/17, her previously described L knee symptoms are worsening.  Daughter states that she feels the increased symptoms may be due to a lot of stair climbing starting at the end of August 2019.  Daughter states that she doesn't feel that her mother got as much relief from the Zilretta injection as she did from some of the other injections. Current symptoms are severe & are nonradiating She has been wearing a knee brace and compression socks.  She most recently had a L knee Zilretta injection on 08/23/17 and prior to that had a regular steroid injection on 06/27/17 and a Monovisc injection on 08/29/16.  L knee XR - 7/1/12003  REVIEW OF SYSTEMS: Denies night time disturbances. Denies fevers, chills, or night sweats. Denies unexplained weight loss. Reports personal history of cancer. Denies changes in bowel or bladder habits. Denies recent unreported falls. Denies new or worsening dyspnea or wheezing. Denies headaches and dizziness - improving.  Denies numbness, tingling or weakness  In the extremities.  Reports dizziness or presyncopal  episodes Reports lower extremity edema    HISTORY & PERTINENT PRIOR DATA:  Prior History reviewed and updated per electronic medical record.  Significant/pertinent history, findings, studies include:  reports that she has never smoked. She has never used smokeless tobacco. No results for input(s): HGBA1C, LABURIC, CREATINE in the last 8760 hours. No specialty comments available. No problems updated.  OBJECTIVE:  VS:  HT:5\' 4"   (162.6 cm)   WT:160 lb (72.6 kg)  BMI:27.45    BP:130/82  HR:61bpm  TEMP: ( )  RESP:96 %   PHYSICAL EXAM: Constitutional: WDWN, Non-toxic appearing. Psychiatric: Alert & appropriately interactive.  Not depressed or anxious appearing. Respiratory: No increased work of breathing.  Trachea Midline Eyes: Pupils are equal.  EOM intact without nystagmus.  No scleral icterus  Vascular Exam: warm to touch no edema  lower extremity neuro exam: unremarkable  MSK Exam: Generalized osteophytic bossing with approximately 85 degrees flexion contracture extensor mechanism strength is intact.  Ligamentously stable to varus and valgus straining.  No significant effusion   ASSESSMENT & PLAN:   1. Chronic pain of left knee   2. Primary osteoarthritis of left knee     PLAN: Monovisc injection today.  Continue activity modifications and be cautious with exercising on steps.  I did discuss potentially going to adjust up the steps and then taking the elevator back down as opposed to going up and down steps   Follow-up: Return in about 12 weeks (around 02/11/2018) for consideration of repeat injections.      Please see additional documentation for Objective, Assessment and Plan sections. Pertinent additional documentation may be included in corresponding procedure notes, imaging studies, problem based documentation and patient instructions. Please see these sections of the encounter for additional information regarding this visit.  CMA/ATC served as Education administrator during this visit. History, Physical, and Plan performed by medical provider. Documentation and orders reviewed and attested to.      Gerda Diss, [facility] Physician

## 2017-11-19 NOTE — Procedures (Signed)
PROCEDURE NOTE:  Ultrasound Guided: Injection: Left knee Images were obtained and interpreted by myself, [name], DO  Images have been saved and stored to PACS system. Images obtained on: GE S7 Ultrasound machine    ULTRASOUND FINDINGS:  Marked tricompartmental degenerative changes.  Moderate synovitis.  DESCRIPTION OF PROCEDURE:  The patient's clinical condition is marked by substantial pain and/or significant functional disability. Other conservative therapy has not provided relief, is contraindicated, or not appropriate. There is a reasonable likelihood that injection will significantly improve the patient's pain and/or functional impairment.   After discussing the risks, benefits and expected outcomes of the injection and all questions were reviewed and answered, the patient wished to undergo the above named procedure.  Verbal consent was obtained.  The ultrasound was used to identify the target structure and adjacent neurovascular structures. The skin was then prepped in sterile fashion and the target structure was injected under direct visualization using sterile technique as below:  Single injection performed as below: PREP: Alcohol and Ethel Chloride APPROACH:superiolateral, single injection, 21g 2 in. INJECTATE: 4cc MonoVisc ASPIRATE: None DRESSING: Band-Aid  Post procedural instructions including recommending icing and warning signs for infection were reviewed.    This procedure was well tolerated and there were no complications.   IMPRESSION: Succesful Ultrasound Guided: Injection

## 2017-11-19 NOTE — Patient Instructions (Signed)

## 2017-12-18 ENCOUNTER — Ambulatory Visit: Payer: Medicare Other | Admitting: Internal Medicine

## 2017-12-19 DIAGNOSIS — N8111 Cystocele, midline: Secondary | ICD-10-CM | POA: Diagnosis not present

## 2017-12-19 DIAGNOSIS — N816 Rectocele: Secondary | ICD-10-CM | POA: Diagnosis not present

## 2017-12-20 ENCOUNTER — Other Ambulatory Visit (INDEPENDENT_AMBULATORY_CARE_PROVIDER_SITE_OTHER): Payer: Medicare Other

## 2017-12-20 ENCOUNTER — Ambulatory Visit (INDEPENDENT_AMBULATORY_CARE_PROVIDER_SITE_OTHER): Payer: Medicare Other | Admitting: Internal Medicine

## 2017-12-20 ENCOUNTER — Encounter

## 2017-12-20 ENCOUNTER — Encounter: Payer: Self-pay | Admitting: Internal Medicine

## 2017-12-20 VITALS — BP 146/86 | HR 61 | Temp 97.6°F | Ht 64.0 in | Wt 161.0 lb

## 2017-12-20 DIAGNOSIS — L570 Actinic keratosis: Secondary | ICD-10-CM | POA: Diagnosis not present

## 2017-12-20 DIAGNOSIS — E034 Atrophy of thyroid (acquired): Secondary | ICD-10-CM | POA: Diagnosis not present

## 2017-12-20 DIAGNOSIS — R269 Unspecified abnormalities of gait and mobility: Secondary | ICD-10-CM | POA: Diagnosis not present

## 2017-12-20 DIAGNOSIS — G43809 Other migraine, not intractable, without status migrainosus: Secondary | ICD-10-CM | POA: Diagnosis not present

## 2017-12-20 DIAGNOSIS — E538 Deficiency of other specified B group vitamins: Secondary | ICD-10-CM

## 2017-12-20 DIAGNOSIS — E559 Vitamin D deficiency, unspecified: Secondary | ICD-10-CM

## 2017-12-20 DIAGNOSIS — F418 Other specified anxiety disorders: Secondary | ICD-10-CM | POA: Diagnosis not present

## 2017-12-20 LAB — BASIC METABOLIC PANEL
BUN: 37 mg/dL — ABNORMAL HIGH (ref 6–23)
CALCIUM: 9 mg/dL (ref 8.4–10.5)
CO2: 22 mEq/L (ref 19–32)
CREATININE: 1.5 mg/dL — AB (ref 0.40–1.20)
Chloride: 108 mEq/L (ref 96–112)
GFR: 34.8 mL/min — AB (ref >60.00)
Glucose, Bld: 90 mg/dL (ref 70–99)
Potassium: 4.3 mEq/L (ref 3.5–5.1)
Sodium: 137 mEq/L (ref 135–145)

## 2017-12-20 LAB — CBC WITH DIFFERENTIAL/PLATELET
Basophils Absolute: 0.1 K/uL (ref 0.0–0.1)
Basophils Relative: 1 % (ref 0.0–3.0)
Eosinophils Absolute: 0.3 K/uL (ref 0.0–0.7)
Eosinophils Relative: 3.1 % (ref 0.0–5.0)
HCT: 36.5 % (ref 36.0–46.0)
Hemoglobin: 12.2 g/dL (ref 12.0–15.0)
Lymphocytes Relative: 18.5 % (ref 12.0–46.0)
Lymphs Abs: 1.6 K/uL (ref 0.7–4.0)
MCHC: 33.5 g/dL (ref 30.0–36.0)
MCV: 91.3 fl (ref 78.0–100.0)
Monocytes Absolute: 0.7 K/uL (ref 0.1–1.0)
Monocytes Relative: 7.5 % (ref 3.0–12.0)
Neutro Abs: 6.2 K/uL (ref 1.4–7.7)
Neutrophils Relative %: 69.9 % (ref 43.0–77.0)
Platelets: 257 K/uL (ref 150.0–400.0)
RBC: 4 Mil/uL (ref 3.87–5.11)
RDW: 14.4 % (ref 11.5–15.5)
WBC: 8.8 K/uL (ref 4.0–10.5)

## 2017-12-20 LAB — HEPATIC FUNCTION PANEL
ALT: 8 U/L (ref 0–35)
AST: 13 U/L (ref 0–37)
Albumin: 4 g/dL (ref 3.5–5.2)
Alkaline Phosphatase: 57 U/L (ref 39–117)
BILIRUBIN TOTAL: 0.4 mg/dL (ref 0.2–1.2)
Bilirubin, Direct: 0.1 mg/dL (ref 0.0–0.3)
Total Protein: 7.7 g/dL (ref 6.0–8.3)

## 2017-12-20 LAB — VITAMIN B12: Vitamin B-12: 1355 pg/mL — ABNORMAL HIGH (ref 211–911)

## 2017-12-20 LAB — TSH: TSH: 2.06 u[IU]/mL (ref 0.35–4.50)

## 2017-12-20 LAB — VITAMIN D 25 HYDROXY (VIT D DEFICIENCY, FRACTURES): VITD: 46.48 ng/mL (ref 30.00–100.00)

## 2017-12-20 MED ORDER — PROPRANOLOL HCL 10 MG PO TABS: 10.0000 mg | ORAL_TABLET | ORAL | 3 refills | Status: DC

## 2017-12-20 MED ORDER — ESCITALOPRAM OXALATE 5 MG PO TABS: 5.0000 mg | ORAL_TABLET | Freq: Every day | ORAL | 3 refills | Status: DC

## 2017-12-20 MED ORDER — PROPRANOLOL HCL 20 MG PO TABS: 20.0000 mg | ORAL_TABLET | Freq: Two times a day (BID) | ORAL | 3 refills | Status: DC

## 2017-12-20 MED ORDER — LORAZEPAM 1 MG PO TABS: ORAL_TABLET | ORAL | 1 refills | Status: DC

## 2017-12-20 MED ORDER — MIRTAZAPINE 15 MG PO TABS: 15.0000 mg | ORAL_TABLET | Freq: Every day | ORAL | 3 refills | Status: DC

## 2017-12-20 NOTE — Assessment & Plan Note (Signed)
  On Mirtazipine  Lexapro Lorazepam prn

## 2017-12-20 NOTE — Assessment & Plan Note (Signed)
On Levothroid 

## 2017-12-20 NOTE — Assessment & Plan Note (Signed)
On Topamax Propranolol Amjovi

## 2017-12-20 NOTE — Assessment & Plan Note (Addendum)
Cryo L cheek If not resolved - bx

## 2017-12-20 NOTE — Assessment & Plan Note (Signed)
Doing well 

## 2017-12-20 NOTE — Patient Instructions (Signed)
   Postprocedure instructions :     Keep the wounds clean. You can wash them with liquid soap and water. Pat dry with gauze or a Kleenex tissue  Before applying antibiotic ointment and a Band-Aid.   You need to report immediately  if  any signs of infection develop.    

## 2017-12-20 NOTE — Assessment & Plan Note (Signed)
labs

## 2017-12-20 NOTE — Progress Notes (Signed)
Subjective:  Patient ID: Anita Banks, female    DOB: [birth year]  Age: 84 y.o. MRN: [id number]  CC: No chief complaint on file.   HPI Zamiyah KYNDLE SCHLENDER presents for HAs - on Enjovi, Propranolol F/u HTN, hypothyroidism  Outpatient Medications Prior to Visit  Medication Sig Dispense Refill  . aspirin 81 MG tablet Take 81 mg by mouth daily.    . Coenzyme Q10 (CO Q-10) 100 MG CAPS Take 1 tablet by mouth daily. 90 each 3  . diclofenac sodium (VOLTAREN) 1 % GEL Apply topically to affected area qid 100 g 1  . escitalopram (LEXAPRO) 5 MG tablet TAKE 1 TABLET (5 MG TOTAL) BY MOUTH DAILY. NEED ANNUAL APPOINTMENT FOR FURTHER REFILLS 90 tablet 1  . estradiol (ESTRACE) 0.1 MG/GM vaginal cream Place 2 g vaginally 2 (two) times a week.    . Fremanezumab-vfrm (AJOVY) 225 MG/1.5ML SOSY Inject 225 mg into the skin every 30 (thirty) days.    Marland Kitchen glucosamine-chondroitin 500-400 MG tablet Take 1 tablet by mouth 2 (two) times daily. 180 tablet 3  . levothyroxine (SYNTHROID, LEVOTHROID) 112 MCG tablet Take 1 tablet (112 mcg total) by mouth daily. 90 tablet 0  . lisinopril (PRINIVIL,ZESTRIL) 10 MG tablet TAKE 1 TABLET BY MOUTH IN THE MORNING AND 2 IN THE EVENING 270 tablet 2  . LORazepam (ATIVAN) 1 MG tablet TAKE 1 TABLET AT BEDTIME MAY REPEAT 1 TIME IF NEEDED 60 tablet 2  . mirtazapine (REMERON) 15 MG tablet TAKE 1 TABLET AT BEDTIME *NEEDS OFFICE VISIT BEFORE REFILLS WILL BE GIVEN* 90 tablet 3  . Multiple Vitamins-Minerals (CENTRUM SILVER ADULT 50+) TABS Take 1 tablet by mouth daily. 90 tablet 3  . Omega-3 Fatty Acids (FISH OIL) 1200 MG CAPS Take 1 capsule (1,200 mg total) by mouth daily. 90 capsule 3  . polyethylene glycol powder (GLYCOLAX/MIRALAX) powder MIX 1 SCOOP IN LIQUID AND TAKE ONCE DAILY. 527 g 2  . propranolol ER (INDERAL LA) 60 MG 24 hr capsule Take by mouth.    . topiramate (TOPAMAX) 50 MG tablet Take 50 mg by mouth daily.    Marland Kitchen triamcinolone cream (KENALOG) 0.1 % Apply 1 application  topically 2 (two) times daily. 160 g 0  . propranolol (INDERAL) 20 MG tablet Take 1 tablet (20 mg total) by mouth 2 (two) times daily. (Patient taking differently: Take 20 mg by mouth 2 (two) times daily. Taking differently - Taking 30 mg in the morning and 20 mg in the evening) 60 tablet 11  . sucralfate (CARAFATE) 1 GM/10ML suspension Take 10 mLs (1 g total) by mouth 4 (four) times daily -  with meals and at bedtime. 420 mL 0   No facility-administered medications prior to visit.     ROS: Review of Systems  Constitutional: Positive for fatigue. Negative for activity change, appetite change, chills and unexpected weight change.  HENT: Negative for congestion, mouth sores and sinus pressure.   Eyes: Negative for visual disturbance.  Respiratory: Negative for cough and chest tightness.   Gastrointestinal: Negative for abdominal pain and nausea.  Genitourinary: Negative for difficulty urinating, frequency and vaginal pain.  Musculoskeletal: Negative for back pain and gait problem.  Skin: Negative for pallor and rash.  Neurological: Positive for headaches. Negative for dizziness, tremors, weakness and numbness.  Psychiatric/Behavioral: Negative for confusion, sleep disturbance and suicidal ideas.    Objective:  BP (!) 146/86 (BP Location: Right Arm, Patient Position: Sitting, Cuff Size: Normal)   Pulse 61   Temp 97.6 F (36.4  C) (Oral)   Ht 5\' 4"  (1.626 m)   Wt 161 lb (73 kg)   SpO2 96%   BMI 27.64 kg/m   BP Readings from Last 3 Encounters:  12/20/17 (!) 146/86  11/19/17 130/82  10/29/17 (!) 155/85    Wt Readings from Last 3 Encounters:  12/20/17 161 lb (73 kg)  11/19/17 160 lb (72.6 kg)  08/23/17 161 lb (73 kg)    Physical Exam  Constitutional: She appears well-developed. No distress.  HENT:  Head: Normocephalic.  Right Ear: External ear normal.  Left Ear: External ear normal.  Nose: Nose normal.  Mouth/Throat: Oropharynx is clear and moist.  Eyes: Pupils are equal,  round, and reactive to light. Conjunctivae are normal. Right eye exhibits no discharge. Left eye exhibits no discharge.  Neck: Normal range of motion. Neck supple. No JVD present. No tracheal deviation present. No thyromegaly present.  Cardiovascular: Normal rate, regular rhythm and normal heart sounds.  Pulmonary/Chest: No stridor. No respiratory distress. She has no wheezes.  Abdominal: Soft. Bowel sounds are normal. She exhibits no distension and no mass. There is no tenderness. There is no rebound and no guarding.  Musculoskeletal: She exhibits no edema or tenderness.  Lymphadenopathy:    She has no cervical adenopathy.  Neurological: She displays normal reflexes. No cranial nerve deficit. She exhibits normal muscle tone. Coordination abnormal.  Skin: No rash noted. No erythema.  Psychiatric: She has a normal mood and affect. Her behavior is normal. Judgment and thought content normal.  AK face   Procedure Note :     Procedure : Cryosurgery   Indication: Actinic keratosis(es)   Risks including unsuccessful procedure , bleeding, infection, bruising, scar, a need for a repeat  procedure and others were explained to the patient in detail as well as the benefits. Informed consent was obtained verbally.    1 lesion(s)  on L face   was/were treated with liquid nitrogen on a Q-tip in a usual fasion . Band-Aid was applied and antibiotic ointment was given for a later use.   Tolerated well. Complications none.     Lab Results  Component Value Date   WBC 7.0 05/07/2017   HGB 12.3 05/07/2017   HCT 36.6 05/07/2017   PLT 280.0 05/07/2017   GLUCOSE 98 05/07/2017   CHOL 254 (H) 11/28/2011   TRIG 257 (H) 11/28/2011   HDL 50 11/28/2011   LDLDIRECT 186.4 02/20/2007   LDLCALC 153 (H) 11/28/2011   ALT 9 05/07/2017   AST 16 05/07/2017   NA 136 05/07/2017   K 4.5 05/07/2017   CL 104 05/07/2017   CREATININE 1.42 (H) 05/07/2017   BUN 31 (H) 05/07/2017   CO2 25 05/07/2017   TSH 0.44  05/07/2017    No results found.  Assessment & Plan:   There are no diagnoses linked to this encounter.   No orders of the defined types were placed in this encounter.    Follow-up: No follow-ups on file.  Walker Kehr, MD

## 2017-12-29 ENCOUNTER — Other Ambulatory Visit: Payer: Self-pay | Admitting: Internal Medicine

## 2017-12-31 ENCOUNTER — Other Ambulatory Visit: Payer: Self-pay

## 2017-12-31 MED ORDER — LEVOTHYROXINE SODIUM 112 MCG PO TABS: 112.0000 ug | ORAL_TABLET | Freq: Every day | ORAL | 1 refills | Status: DC

## 2018-01-06 IMAGING — DX DG CHEST 2V
2 series · 2 of 2 positions shown · non-contrast
Comparison: 05/08/2015.

CLINICAL DATA: Cough and congestion.

EXAM:
CHEST  2 VIEW

[chest pa]
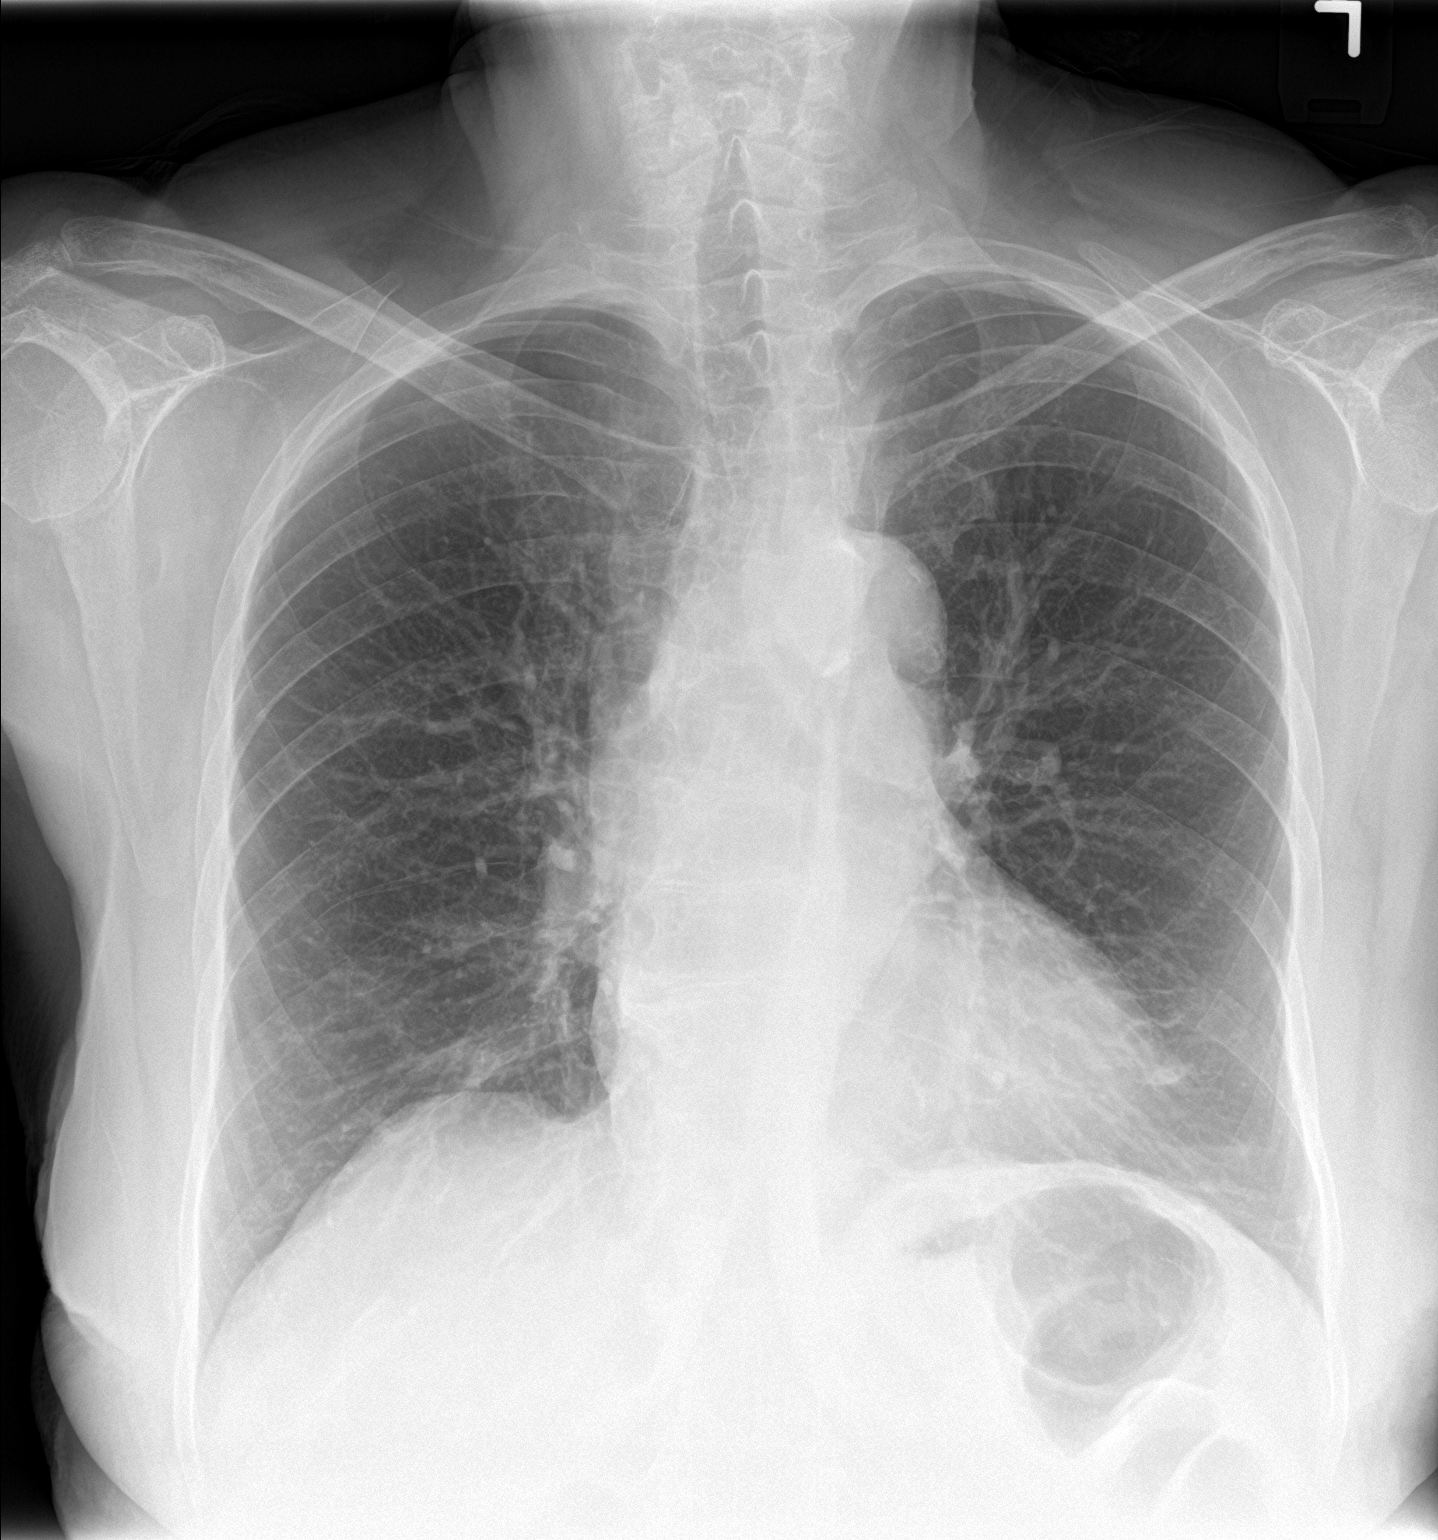

[chest lat]
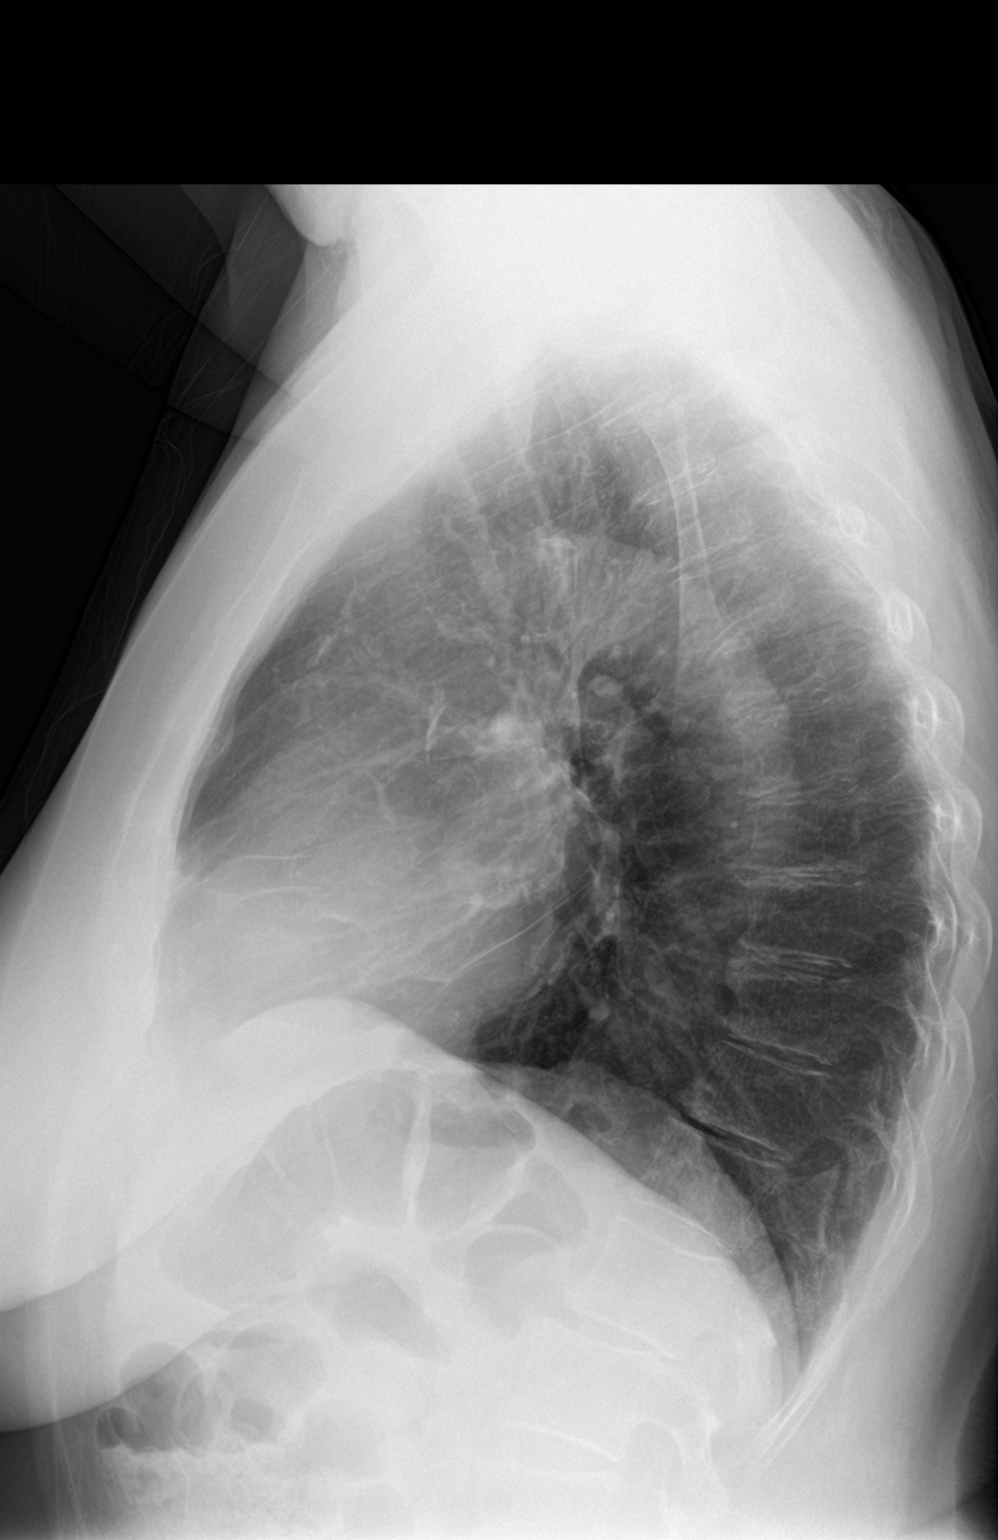

[2 of 2 positions shown; findings below may reference images not displayed]

FINDINGS: Mediastinum and hilar structures are normal. Heart size normal. Mild
bibasilar subsegmental atelectasis. No focal infiltrate. Previously
identified small amount of fluid in the right minor fissure is
cleared. No pleural effusion or pneumothorax.
IMPRESSION: 1. Mild bibasilar subsegmental atelectasis. Previously identified
small amount of fluid noted in the right minor fissure has cleared.

2.  Mild cardiomegaly.  No pulmonary venous congestion .

## 2018-02-19 ENCOUNTER — Ambulatory Visit (INDEPENDENT_AMBULATORY_CARE_PROVIDER_SITE_OTHER): Payer: Medicare Other | Admitting: Sports Medicine

## 2018-02-19 ENCOUNTER — Ambulatory Visit: Payer: Medicare Other | Admitting: Sports Medicine

## 2018-02-19 ENCOUNTER — Encounter: Payer: Self-pay | Admitting: Sports Medicine

## 2018-02-19 VITALS — BP 150/86 | HR 53 | Ht 64.0 in | Wt 160.4 lb

## 2018-02-19 DIAGNOSIS — G8929 Other chronic pain: Secondary | ICD-10-CM

## 2018-02-19 DIAGNOSIS — M1712 Unilateral primary osteoarthritis, left knee: Secondary | ICD-10-CM

## 2018-02-19 DIAGNOSIS — M25562 Pain in left knee: Secondary | ICD-10-CM | POA: Diagnosis not present

## 2018-02-19 NOTE — Progress Notes (Signed)
Anita Banks. Anita Banks, [facility] - 85 y.o. female MRN [id number]  Date of birth: 1927/10/20  Visit Date: February 19, 2018  PCP: [name], MD   Referred by: [name], MD  SUBJECTIVE:  Chief Complaint  Patient presents with  . f/u L knee    Steroid joint inj 06/27/2017, Zilretta inj 08/29/16 and 08/23/17, Monovisc inj 11/19/2017. Xr L knee 08/06/2001. DME - knee brace, compression sock. Voltaren gel.     HPI: Patient is here for 45-month follow-up after last injections.  She has had good improvement and denies any significant giving way.  She has significant mechanical pain and knows that she is a candidate for total knee arthroplasty but is not interested in this at this time.  REVIEW OF SYSTEMS: Per HPI  HISTORY:  Prior history reviewed and updated per electronic medical record.  Social History   Occupational History  . Not on file  Tobacco Use  . Smoking status: Never Smoker  . Smokeless tobacco: Never Used  Substance and Sexual Activity  . Alcohol use: Not on file  . Drug use: No  . Sexual activity: Not on file   Social History   Social History Narrative  . Not on file     DATA OBTAINED & REVIEWED:  Recent Labs    05/07/17 1714 12/20/17 1033  CALCIUM 9.2 9.0  AST 16 13  ALT 9 8  TSH 0.44 2.06   No problems updated. No specialty comments available. Adult female.  No acute distress.  She walks with a slightly antalgic bent knee gait but this is minimal.  Her extensor mechanism strength is intact.  Ligamentously she is stable to varus and valgus strain.  Her daughter is here today and serves as her interpreter.  OBJECTIVE:  VS:  HT:5\' 4"  (162.6 cm)   WT:160 lb 6.4 oz (72.8 kg)  BMI:27.52    BP: (!) 150/86  HR:(!) 53bpm  TEMP: ( )  RESP:96 %   PHYSICAL EXAM:   ASSESSMENT  1. Chronic pain of left knee   2. Primary osteoarthritis of left knee       PROCEDURES:  None  PLAN:  Pertinent additional documentation may be included in corresponding procedure notes, imaging studies, problem based documentation and patient instructions.  No problem-specific Assessment & Plan notes found for this encounter. Symptoms are minimal at this time given the extensive degenerative changes that she has.  Overall she is responded the best to Monovisc.  Is more than 3 months since his last injection was performed we discussed the options with corticosteroid injections versus Zilretta versus deferring repeat injections.  At this time her symptoms are minimal and we will hold off until the 85-month mark.  If she has any acute flareups in the interim we can consider Pete corticosteroid versus repeat Zilretta injections  Activity modifications and the importance of avoiding exacerbating activities (limiting pain to no more than a 4 / 10 during or following activity) recommended and discussed. Discussed red flag symptoms that warrant earlier emergent evaluation and patient voices understanding. No orders of the defined types were placed in this encounter. Lab Orders  No laboratory test(s) ordered today   Imaging Orders  No imaging studies ordered today   Referral Orders  No referral(s) requested today    At follow up will plan : Visco-supplementation With Monovisc Return in about 14 weeks (around 05/28/2018).  Gerda Diss, [facility] Physician

## 2018-03-24 ENCOUNTER — Other Ambulatory Visit: Payer: Self-pay | Admitting: Internal Medicine

## 2018-04-22 MED ORDER — ESCITALOPRAM OXALATE 5 MG PO TABS: 5.0000 mg | ORAL_TABLET | Freq: Every day | ORAL | 3 refills | Status: AC

## 2018-04-22 MED ORDER — LORAZEPAM 1 MG PO TABS: ORAL_TABLET | ORAL | 1 refills | Status: AC

## 2018-04-22 MED ORDER — PROPRANOLOL HCL 20 MG PO TABS: 20.0000 mg | ORAL_TABLET | Freq: Two times a day (BID) | ORAL | 3 refills | Status: AC

## 2018-04-22 MED ORDER — LEVOTHYROXINE SODIUM 112 MCG PO TABS: 112.0000 ug | ORAL_TABLET | Freq: Every day | ORAL | 1 refills | Status: AC

## 2018-04-22 MED ORDER — PROPRANOLOL HCL 10 MG PO TABS: 10.0000 mg | ORAL_TABLET | ORAL | 3 refills | Status: AC

## 2018-04-22 MED ORDER — MIRTAZAPINE 15 MG PO TABS: 15.0000 mg | ORAL_TABLET | Freq: Every day | ORAL | 3 refills | Status: AC

## 2018-07-29 ENCOUNTER — Ambulatory Visit: Payer: Self-pay

## 2018-07-29 ENCOUNTER — Emergency Department (HOSPITAL_COMMUNITY)
Admission: EM | Admit: 2018-07-29 | Discharge: 2018-07-30 | Disposition: A | Discharge status: Home / Self Care | Payer: Medicare Other | Attending: Emergency Medicine | Admitting: Emergency Medicine

## 2018-07-29 ENCOUNTER — Emergency Department (HOSPITAL_COMMUNITY): Payer: Medicare Other

## 2018-07-29 ENCOUNTER — Ambulatory Visit (HOSPITAL_COMMUNITY)
Admission: EM | Admit: 2018-07-29 | Discharge: 2018-07-29 | Disposition: A | Discharge status: Other Acute Inpatient Hospital | Payer: Medicare Other | Source: Home / Self Care

## 2018-07-29 ENCOUNTER — Encounter (HOSPITAL_COMMUNITY): Payer: Self-pay

## 2018-07-29 ENCOUNTER — Other Ambulatory Visit: Payer: Self-pay

## 2018-07-29 DIAGNOSIS — E034 Atrophy of thyroid (acquired): Secondary | ICD-10-CM | POA: Insufficient documentation

## 2018-07-29 DIAGNOSIS — Z20828 Contact with and (suspected) exposure to other viral communicable diseases: Secondary | ICD-10-CM | POA: Insufficient documentation

## 2018-07-29 DIAGNOSIS — Z79899 Other long term (current) drug therapy: Secondary | ICD-10-CM | POA: Diagnosis not present

## 2018-07-29 DIAGNOSIS — K5732 Diverticulitis of large intestine without perforation or abscess without bleeding: Secondary | ICD-10-CM | POA: Diagnosis not present

## 2018-07-29 DIAGNOSIS — N816 Rectocele: Secondary | ICD-10-CM | POA: Diagnosis not present

## 2018-07-29 DIAGNOSIS — Z7982 Long term (current) use of aspirin: Secondary | ICD-10-CM | POA: Diagnosis not present

## 2018-07-29 DIAGNOSIS — I10 Essential (primary) hypertension: Secondary | ICD-10-CM | POA: Diagnosis not present

## 2018-07-29 DIAGNOSIS — R509 Fever, unspecified: Secondary | ICD-10-CM | POA: Diagnosis present

## 2018-07-29 DIAGNOSIS — N390 Urinary tract infection, site not specified: Secondary | ICD-10-CM

## 2018-07-29 DIAGNOSIS — N8111 Cystocele, midline: Secondary | ICD-10-CM | POA: Diagnosis not present

## 2018-07-29 LAB — CBC
HCT: 37.1 % (ref 36.0–46.0)
Hemoglobin: 11.9 g/dL — ABNORMAL LOW (ref 12.0–15.0)
MCH: 29.8 pg (ref 26.0–34.0)
MCHC: 32.1 g/dL (ref 30.0–36.0)
MCV: 92.8 fL (ref 80.0–100.0)
Platelets: 264 10*3/uL (ref 150–400)
RBC: 4 MIL/uL (ref 3.87–5.11)
RDW: 13 % (ref 11.5–15.5)
WBC: 13.2 10*3/uL — ABNORMAL HIGH (ref 4.0–10.5)
nRBC: 0 % (ref 0.0–0.2)

## 2018-07-29 LAB — URINALYSIS, ROUTINE W REFLEX MICROSCOPIC
Bilirubin Urine: NEGATIVE
Glucose, UA: NEGATIVE mg/dL
Ketones, ur: 5 mg/dL — AB
Nitrite: NEGATIVE
Protein, ur: 30 mg/dL — AB
Specific Gravity, Urine: 1.011 (ref 1.005–1.030)
WBC, UA: >50 WBC/hpf — ABNORMAL HIGH (ref 0–5)
pH: 7 (ref 5.0–8.0)

## 2018-07-29 LAB — COMPREHENSIVE METABOLIC PANEL
ALT: 12 U/L (ref 0–44)
AST: 19 U/L (ref 15–41)
Albumin: 3.6 g/dL (ref 3.5–5.0)
Alkaline Phosphatase: 68 U/L (ref 38–126)
Anion gap: 8 (ref 5–15)
BUN: 26 mg/dL — ABNORMAL HIGH (ref 8–23)
CO2: 21 mmol/L — ABNORMAL LOW (ref 22–32)
Calcium: 9.1 mg/dL (ref 8.9–10.3)
Chloride: 104 mmol/L (ref 98–111)
Creatinine, Ser: 1.51 mg/dL — ABNORMAL HIGH (ref 0.44–1.00)
GFR calc Af Amer: 35 mL/min — ABNORMAL LOW (ref >60)
GFR calc non Af Amer: 31 mL/min — ABNORMAL LOW (ref >60)
Glucose, Bld: 117 mg/dL — ABNORMAL HIGH (ref 70–99)
Potassium: 4.3 mmol/L (ref 3.5–5.1)
Sodium: 133 mmol/L — ABNORMAL LOW (ref 135–145)
Total Bilirubin: 0.5 mg/dL (ref 0.3–1.2)
Total Protein: 8 g/dL (ref 6.5–8.1)

## 2018-07-29 LAB — LIPASE, BLOOD: Lipase: 67 U/L — ABNORMAL HIGH (ref 11–51)

## 2018-07-29 MED ORDER — SODIUM CHLORIDE 0.9% FLUSH: 3.0000 mL | Freq: Once | INTRAVENOUS | Status: DC

## 2018-07-29 MED ORDER — SODIUM CHLORIDE 0.9 % IV SOLN
1.0000 g | Freq: Once | INTRAVENOUS | Status: AC
  Administered 2018-07-29: 1 g via INTRAVENOUS
  Filled 2018-07-29: qty 10

## 2018-07-29 MED ORDER — ACETAMINOPHEN 500 MG PO TABS
1000.0000 mg | ORAL_TABLET | Freq: Once | ORAL | Status: DC
  Filled 2018-07-29: qty 2

## 2018-07-29 NOTE — ED Notes (Signed)
ED Provider at bedside. 

## 2018-07-29 NOTE — ED Notes (Signed)
Patient transported to CT 

## 2018-07-29 NOTE — Telephone Encounter (Signed)
Incoming call from Patients family with a complaint of patient having a temperature .  With a sudden onset.  Patient had a headache earlier.  Had not  Treated the fever with any thing. Reports that the Patient is appearing weak.Family states that she will  Take her to Urgent care.     Reason for Disposition . [1] Headache AND [2] stiff neck (can't touch chin to chest)  Answer Assessment - Initial Assessment Questions 1. TEMPERATURE: "What is the most recent temperature?"  "How was it measured?"    99.2 2. ONSET: "When did the fever start?"     One hour ago 3. SYMPTOMS: "Do you have any other symptoms besides the fever?"  (e.g., colds, headache, sore throat, earache, cough, rash, diarrhea, vomiting, abdominal pain)     Denies 4. CAUSE: If there are no symptoms, ask: "What do you think is causing the fever?"      *No Answer* 5. CONTACTS: "Does anyone else in the family have an infection?"     unknown 6. TREATMENT: "What have you done so far to treat this fever?" (e.g., medications)     7. IMMUNOCOMPROMISE: "Do you have of the following: diabetes, HIV positive, splenectomy, cancer chemotherapy, chronic steroid treatment, transplant patient, etc."     denies 8. PREGNANCY: "Is there any chance you are pregnant?" "When was your last menstrual period?"     na9. TRAVEL: "Have you traveled out of the country in the last month?" (e.g., travel history, exposures)     na  Protocols used: FEVER-A-AH

## 2018-07-29 NOTE — ED Triage Notes (Signed)
Per SN pt to go to ED for further eval

## 2018-07-29 NOTE — ED Triage Notes (Signed)
Pt states that approximately 3 hours ago pt began to experience fever, nausea, vomiting. States she 2 emesis episodes. Pt is non-english speaking, russian only. Patient is with family.

## 2018-07-29 NOTE — Telephone Encounter (Signed)
FYI

## 2018-07-29 NOTE — ED Notes (Signed)
[place] video interpreter at the bedside used for RN assessment. Visitor policy explained for family. Pt is alert and oriented. Stand by assist ambulated to the restroom. Pt has not had any covid symptoms and has not had any recent travel. Spoke with lab, they will add on urine culture.

## 2018-07-30 MED ORDER — ONDANSETRON 4 MG PO TBDP: 4.0000 mg | ORAL_TABLET | Freq: Three times a day (TID) | ORAL | 0 refills | Status: AC | PRN

## 2018-07-30 MED ORDER — METRONIDAZOLE 500 MG PO TABS: 500.0000 mg | ORAL_TABLET | Freq: Two times a day (BID) | ORAL | 0 refills | Status: AC

## 2018-07-30 MED ORDER — CEFDINIR 300 MG PO CAPS: 300.0000 mg | ORAL_CAPSULE | Freq: Two times a day (BID) | ORAL | 0 refills | Status: DC

## 2018-07-30 NOTE — ED Provider Notes (Addendum)
[facility] EMERGENCY DEPARTMENT Provider Note   CSN: [id number] Arrival date & time: 07/29/18  1533     History   Chief Complaint Chief Complaint  Patient presents with  . Fever    HPI Anita Banks is a 85 y.o. female.     Patient presents to the emergency department with a chief complaint of fever.  She is brought in by her daughter.  Daughter reports that the patient had some chills and fever at home today.  Denies any other associated symptoms.  Patient's daughter is concerned about coronavirus, and reports that they have had potential exposure.  Patient denies any cough, chest pain, shortness of breath.  No treatments tried prior to arrival.  The history is provided by the patient and a relative. The history is limited by a language barrier. A language interpreter was used.    Past Medical History:  Diagnosis Date  . Anxiety   . Arthritis   . Hypertension   . Migraine     Patient Active Problem List   Diagnosis Date Noted  . Vitamin D deficiency 12/20/2017  . Drug reaction 08/07/2017  . Lower extremity edema 08/29/2016  . Cough 01/12/2016  . Wheezing 01/12/2016  . Occipital headache 12/24/2015  . Chest pain, atypical 09/13/2015  . Acute upper respiratory infection 04/29/2015  . Pruritic condition 01/18/2015  . Actinic keratoses 09/13/2014  . Gait disorder 09/04/2014  . Rash and nonspecific skin eruption 09/04/2014  . Polymyalgia rheumatica ([place]) 04/20/2011  . INSOMNIA, PERSISTENT 06/26/2007  . HYPERCHOLESTEROLEMIA 02/20/2007  . Depression with anxiety 02/20/2007  . Hypothyroidism due to acquired atrophy of thyroid 12/03/2006  . Osteoarthritis of left knee 12/03/2006  . OSTEOPENIA 12/03/2006  . Migraine variant 11/26/2006  . IRRITABLE BOWEL SYNDROME 11/26/2006    Past Surgical History:  Procedure Laterality Date  . ABDOMINAL HYSTERECTOMY    . hemhorroid  2003     OB History   No obstetric history on file.      Home  Medications    Prior to Admission medications   Medication Sig Start Date End Date Taking? Authorizing Provider  aspirin 81 MG tablet Take 81 mg by mouth daily.    [provider]  cefdinir (OMNICEF) 300 MG capsule Take 1 capsule (300 mg total) by mouth 2 (two) times daily. 07/30/18   Montine Circle, PA-C  Coenzyme Q10 (CO Q-10) 100 MG CAPS Take 1 tablet by mouth daily. 01/09/13   Robyn Haber, MD  diclofenac sodium (VOLTAREN) 1 % GEL Apply topically to affected area qid 06/27/17   Gerda Diss, DO  escitalopram (LEXAPRO) 5 MG tablet Take 1 tablet (5 mg total) by mouth daily. 04/22/18   Plotnikov, Evie Lacks, MD  estradiol (ESTRACE) 0.1 MG/GM vaginal cream Place 2 g vaginally 2 (two) times a week.    [provider]  Fremanezumab-vfrm (AJOVY) 225 MG/1.5ML SOSY Inject 225 mg into the skin every 30 (thirty) days.    [provider]  glucosamine-chondroitin 500-400 MG tablet Take 1 tablet by mouth 2 (two) times daily. 01/09/13   Robyn Haber, MD  levothyroxine (SYNTHROID, LEVOTHROID) 112 MCG tablet Take 1 tablet (112 mcg total) by mouth daily. 04/22/18   Plotnikov, Evie Lacks, MD  lisinopril (PRINIVIL,ZESTRIL) 10 MG tablet TAKE 1 TABLET BY MOUTH IN THE MORNING AND 2 IN THE EVENING 03/25/18   Plotnikov, Evie Lacks, MD  LORazepam (ATIVAN) 1 MG tablet TAKE 1 TABLET AT BEDTIME MAY REPEAT 1 TIME IF NEEDED 04/22/18  Plotnikov, Evie Lacks, MD  metroNIDAZOLE (FLAGYL) 500 MG tablet Take 1 tablet (500 mg total) by mouth 2 (two) times daily. 07/30/18   Montine Circle, PA-C  mirtazapine (REMERON) 15 MG tablet Take 1 tablet (15 mg total) by mouth at bedtime. 04/22/18   Plotnikov, Evie Lacks, MD  Multiple Vitamins-Minerals (CENTRUM SILVER ADULT 50+) TABS Take 1 tablet by mouth daily. 01/09/13   Robyn Haber, MD  Omega-3 Fatty Acids (FISH OIL) 1200 MG CAPS Take 1 capsule (1,200 mg total) by mouth daily. 01/09/13   Robyn Haber, MD  polyethylene glycol powder (GLYCOLAX/MIRALAX)  powder MIX 1 SCOOP IN LIQUID AND TAKE ONCE DAILY. 10/10/16   Plotnikov, Evie Lacks, MD  propranolol (INDERAL) 10 MG tablet Take 1 tablet (10 mg total) by mouth every morning. 04/22/18   Plotnikov, Evie Lacks, MD  propranolol (INDERAL) 20 MG tablet Take 1 tablet (20 mg total) by mouth 2 (two) times daily. 04/22/18   Plotnikov, Evie Lacks, MD  topiramate (TOPAMAX) 50 MG tablet Take 50 mg by mouth daily.    [provider]  triamcinolone cream (KENALOG) 0.1 % Apply 1 application topically 2 (two) times daily. 03/08/17   Plotnikov, Evie Lacks, MD    Family History History reviewed. No pertinent family history.  Social History Social History   Tobacco Use  . Smoking status: Never Smoker  . Smokeless tobacco: Never Used  Substance Use Topics  . Alcohol use: Not on file  . Drug use: No     Allergies   Amitriptyline hcl, Azithromycin, Doxycycline, Erythromycin, Metronidazole, Penicillins, Rizatriptan benzoate, Rosuvastatin, and Tetracyclines & related   Review of Systems Review of Systems  All other systems reviewed and are negative.    Physical Exam Updated Vital Signs BP (!) 157/74   Pulse (!) 58   Temp 98.2 F (36.8 C) (Oral)   Resp (!) 21   SpO2 97%   Physical Exam Vitals signs and nursing note reviewed.  Constitutional:      General: She is not in acute distress.    Appearance: She is well-developed.  HENT:     Head: Normocephalic and atraumatic.  Eyes:     Conjunctiva/sclera: Conjunctivae normal.  Neck:     Musculoskeletal: Neck supple.  Cardiovascular:     Rate and Rhythm: Normal rate and regular rhythm.     Heart sounds: No murmur.  Pulmonary:     Effort: Pulmonary effort is normal. No respiratory distress.     Breath sounds: Normal breath sounds.  Abdominal:     Palpations: Abdomen is soft.     Tenderness: There is no abdominal tenderness.     Comments: No focal abdominal tenderness, no RLQ tenderness or pain at McBurney's point, no RUQ tenderness or  Murphy's sign, no left-sided abdominal tenderness, no fluid wave, or signs of peritonitis   Musculoskeletal: Normal range of motion.  Skin:    General: Skin is warm and dry.  Neurological:     Mental Status: She is alert and oriented to person, place, and time.  Psychiatric:        Mood and Affect: Mood normal.        Behavior: Behavior normal.        Thought Content: Thought content normal.        Judgment: Judgment normal.      ED Treatments / Results  Labs (all labs ordered are listed, but only abnormal results are displayed) Labs Reviewed  LIPASE, BLOOD - Abnormal; Notable for the following components:  Result Value   Lipase 67 (*)    All other components within normal limits  COMPREHENSIVE METABOLIC PANEL - Abnormal; Notable for the following components:   Sodium 133 (*)    CO2 21 (*)    Glucose, Bld 117 (*)    BUN 26 (*)    Creatinine, Ser 1.51 (*)    GFR calc non Af Amer 31 (*)    GFR calc Af Amer 35 (*)    All other components within normal limits  CBC - Abnormal; Notable for the following components:   WBC 13.2 (*)    Hemoglobin 11.9 (*)    All other components within normal limits  URINALYSIS, ROUTINE W REFLEX MICROSCOPIC - Abnormal; Notable for the following components:   Color, Urine STRAW (*)    APPearance HAZY (*)    Hgb urine dipstick SMALL (*)    Ketones, ur 5 (*)    Protein, ur 30 (*)    Leukocytes,Ua LARGE (*)    WBC, UA >50 (*)    Bacteria, UA RARE (*)    All other components within normal limits  NOVEL CORONAVIRUS, NAA (HOSPITAL ORDER, SEND-OUT TO REF LAB)  URINE CULTURE    EKG    Radiology Ct Renal Stone Study  Result Date: 07/29/2018 CLINICAL DATA:  Flank pain, stone disease suspected. Fever, nausea and vomiting. EXAM: CT ABDOMEN AND PELVIS WITHOUT CONTRAST TECHNIQUE: Multidetector CT imaging of the abdomen and pelvis was performed following the standard protocol without IV contrast. COMPARISON:  CT 05/19/2008 FINDINGS: Lower chest:  Mild atelectasis. Mild cardiomegaly with coronary artery calcifications. Hepatobiliary: Mild motion artifact. No focal liver abnormality is seen. No gallstones, gallbladder wall thickening, or biliary dilatation. Pancreas: No ductal dilatation or inflammation. Spleen: Normal in size without focal abnormality. Adrenals/Urinary Tract: Normal adrenal glands. Bilateral renal pelvis prominence without ureteral dilatation. No urolithiasis. Limited assessment for perinephric edema due to motion. Indentation of the posterior bladder from pessary. No bladder wall thickening. Stomach/Bowel: Small hiatal hernia. Stomach is nondistended. Appendix appears normal. No evidence of small bowel wall thickening, distention, or inflammatory changes. Sigmoid colonic diverticulosis, mild fat stranding about the mid sigmoid diverticulum. No perforation or abscess. Vascular/Lymphatic: Aortic atherosclerosis without aneurysm. Small retroperitoneal nodes not enlarged by size criteria. Reproductive: Uterus and bilateral adnexa are unremarkable. Pessary in place. Other: No free air, free fluid, or intra-abdominal fluid collection. Small fat containing umbilical hernia. Musculoskeletal: Grade 1 anterolisthesis of L4 on L5 likely facet mediated. Multilevel degenerative change throughout spine. There are no acute or suspicious osseous abnormalities. IMPRESSION: 1. Mild uncomplicated sigmoid diverticulitis. 2. Bilateral renal pelvis prominence without ureteral dilatation or urolithiasis, likely extrarenal pelvis configuration. 3. Aortic Atherosclerosis (ICD10-I70.0). Coronary artery calcifications. Electronically Signed   By: Keith Rake M.D.   On: 07/29/2018 23:36    Procedures Procedures (including critical care time)  Medications Ordered in ED Medications  sodium chloride flush (NS) 0.9 % injection 3 mL (has no administration in time range)  acetaminophen (TYLENOL) tablet 1,000 mg (1,000 mg Oral Refused 07/29/18 2356)  cefTRIAXone  (ROCEPHIN) 1 g in sodium chloride 0.9 % 100 mL IVPB (0 g Intravenous Stopped 07/29/18 2356)     Initial Impression / Assessment and Plan / ED Course  I have reviewed the triage vital signs and the nursing notes.  Pertinent labs & imaging results that were available during my care of the patient were reviewed by me and considered in my medical decision making (see chart for details).  Patient brought to emergency department by daughter for fevers at home.  Temperature is 100.2 in triage.  Laboratory work-up notable for elevated white blood cell count and urinalysis consistent with UTI.  Will check CT to rule out stone.  CT shows mild sigmoid diverticulitis.  Patient given Rocephin in the emergency department.  She was offered admission, but declines.  Her daughter will stay with her at home.  They have good follow-up with her primary care doctor.  Will discharge home on cefdinir and Flagyl, due to the patient's numerous allergies.  Seen by and discussed with Dr. Randal Buba.  Final Clinical Impressions(s) / ED Diagnoses   Final diagnoses:  Urinary tract infection without hematuria, site unspecified  Sigmoid diverticulitis    ED Discharge Orders         Ordered    metroNIDAZOLE (FLAGYL) 500 MG tablet  2 times daily     07/30/18 0008    cefdinir (OMNICEF) 300 MG capsule  2 times daily     07/30/18 0008           Montine Circle, PA-C 07/30/18 0024    Montine Circle, PA-C 07/30/18 Fredricka Bonine, April, MD 07/30/18 0139

## 2018-07-30 NOTE — ED Notes (Signed)
Discharge instructions reviewed via [place] Research officer, political party. Pt daughter was present during d/c instructions. Further questions answered.

## 2018-07-31 LAB — URINE CULTURE: Culture: >=10000 — AB

## 2018-07-31 LAB — NOVEL CORONAVIRUS, NAA (HOSP ORDER, SEND-OUT TO REF LAB; TAT 18-24 HRS): SARS-CoV-2, NAA: NOT DETECTED

## 2018-08-12 ENCOUNTER — Other Ambulatory Visit: Payer: Self-pay | Admitting: Internal Medicine

## 2018-08-12 MED ORDER — FLUCONAZOLE 150 MG PO TABS: 150.0000 mg | ORAL_TABLET | Freq: Once | ORAL | 1 refills | Status: AC

## 2018-08-13 ENCOUNTER — Encounter: Payer: Self-pay | Admitting: Internal Medicine

## 2018-08-13 ENCOUNTER — Other Ambulatory Visit (INDEPENDENT_AMBULATORY_CARE_PROVIDER_SITE_OTHER): Payer: Medicare Other

## 2018-08-13 ENCOUNTER — Other Ambulatory Visit: Payer: Self-pay

## 2018-08-13 ENCOUNTER — Ambulatory Visit (INDEPENDENT_AMBULATORY_CARE_PROVIDER_SITE_OTHER): Payer: Medicare Other | Admitting: Internal Medicine

## 2018-08-13 DIAGNOSIS — R197 Diarrhea, unspecified: Secondary | ICD-10-CM

## 2018-08-13 DIAGNOSIS — L304 Erythema intertrigo: Secondary | ICD-10-CM

## 2018-08-13 DIAGNOSIS — I10 Essential (primary) hypertension: Secondary | ICD-10-CM

## 2018-08-13 DIAGNOSIS — K581 Irritable bowel syndrome with constipation: Secondary | ICD-10-CM

## 2018-08-13 LAB — BASIC METABOLIC PANEL
BUN: 32 mg/dL — ABNORMAL HIGH (ref 6–23)
CO2: 20 mEq/L (ref 19–32)
Calcium: 9.1 mg/dL (ref 8.4–10.5)
Chloride: 105 mEq/L (ref 96–112)
Creatinine, Ser: 1.42 mg/dL — ABNORMAL HIGH (ref 0.40–1.20)
GFR: 34.83 mL/min — ABNORMAL LOW (ref >60.00)
Glucose, Bld: 99 mg/dL (ref 70–99)
Potassium: 4 mEq/L (ref 3.5–5.1)
Sodium: 134 mEq/L — ABNORMAL LOW (ref 135–145)

## 2018-08-13 MED ORDER — CLOTRIMAZOLE-BETAMETHASONE 1-0.05 % EX CREA: 1.0000 "application " | TOPICAL_CREAM | Freq: Two times a day (BID) | CUTANEOUS | 1 refills | Status: AC

## 2018-08-13 MED ORDER — CLOTRIMAZOLE-BETAMETHASONE 1-0.05 % EX CREA: 1.0000 "application " | TOPICAL_CREAM | Freq: Two times a day (BID) | CUTANEOUS | 1 refills | Status: DC

## 2018-08-13 NOTE — Progress Notes (Signed)
Subjective:  Patient ID: Anita Banks, female    DOB: 13-Feb-1927  Age: 85 y.o. MRN: [id number]  CC: No chief complaint on file.   HPI Anita Banks presents for diverticulitis  - treated C/o rectal d/c - mucus x 2 days C/o yeast infection - rash  Outpatient Medications Prior to Visit  Medication Sig Dispense Refill   aspirin 81 MG tablet Take 81 mg by mouth daily.     Coenzyme Q10 (CO Q-10) 100 MG CAPS Take 1 tablet by mouth daily. 90 each 3   diclofenac sodium (VOLTAREN) 1 % GEL Apply topically to affected area qid 100 g 1   escitalopram (LEXAPRO) 5 MG tablet Take 1 tablet (5 mg total) by mouth daily. 90 tablet 3   estradiol (ESTRACE) 0.1 MG/GM vaginal cream Place 2 g vaginally 2 (two) times a week.     Fremanezumab-vfrm (AJOVY) 225 MG/1.5ML SOSY Inject 225 mg into the skin every 30 (thirty) days.     glucosamine-chondroitin 500-400 MG tablet Take 1 tablet by mouth 2 (two) times daily. 180 tablet 3   levothyroxine (SYNTHROID, LEVOTHROID) 112 MCG tablet Take 1 tablet (112 mcg total) by mouth daily. 90 tablet 1   lisinopril (PRINIVIL,ZESTRIL) 10 MG tablet TAKE 1 TABLET BY MOUTH IN THE MORNING AND 2 IN THE EVENING 270 tablet 2   LORazepam (ATIVAN) 1 MG tablet TAKE 1 TABLET AT BEDTIME MAY REPEAT 1 TIME IF NEEDED 180 tablet 1   metroNIDAZOLE (FLAGYL) 500 MG tablet Take 1 tablet (500 mg total) by mouth 2 (two) times daily. 20 tablet 0   mirtazapine (REMERON) 15 MG tablet Take 1 tablet (15 mg total) by mouth at bedtime. 90 tablet 3   Multiple Vitamins-Minerals (CENTRUM SILVER ADULT 50+) TABS Take 1 tablet by mouth daily. 90 tablet 3   Omega-3 Fatty Acids (FISH OIL) 1200 MG CAPS Take 1 capsule (1,200 mg total) by mouth daily. 90 capsule 3   ondansetron (ZOFRAN ODT) 4 MG disintegrating tablet Take 1 tablet (4 mg total) by mouth every 8 (eight) hours as needed for nausea or vomiting. 10 tablet 0   polyethylene glycol powder (GLYCOLAX/MIRALAX) powder MIX 1 SCOOP IN  LIQUID AND TAKE ONCE DAILY. 527 g 2   propranolol (INDERAL) 10 MG tablet Take 1 tablet (10 mg total) by mouth every morning. 90 tablet 3   propranolol (INDERAL) 20 MG tablet Take 1 tablet (20 mg total) by mouth 2 (two) times daily. 180 tablet 3   propranolol ER (INDERAL LA) 60 MG 24 hr capsule Take 1 each by mouth daily.     topiramate (TOPAMAX) 50 MG tablet Take 50 mg by mouth daily.     triamcinolone cream (KENALOG) 0.1 % Apply 1 application topically 2 (two) times daily. 160 g 0   cefdinir (OMNICEF) 300 MG capsule Take 1 capsule (300 mg total) by mouth 2 (two) times daily. (Patient not taking: Reported on 08/13/2018) 20 capsule 0   No facility-administered medications prior to visit.     ROS: Review of Systems  Constitutional: Positive for fatigue. Negative for activity change, appetite change, chills and unexpected weight change.  HENT: Negative for congestion, mouth sores and sinus pressure.   Eyes: Negative for visual disturbance.  Respiratory: Negative for cough, chest tightness and shortness of breath.   Gastrointestinal: Positive for diarrhea. Negative for abdominal pain and nausea.  Genitourinary: Negative for difficulty urinating, frequency and vaginal pain.  Musculoskeletal: Positive for arthralgias and back pain. Negative for gait problem.  Skin: Positive for rash. Negative for pallor.  Neurological: Negative for dizziness, tremors, weakness, numbness and headaches.  Psychiatric/Behavioral: Negative for confusion, sleep disturbance and suicidal ideas. The patient is nervous/anxious.     Objective:  BP (!) 180/100 (BP Location: Left Arm, Patient Position: Sitting, Cuff Size: Normal)    Pulse 62    Temp (!) 97.4 F (36.3 C) (Oral)    Ht 5\' 4"  (1.626 m)    Wt 157 lb (71.2 kg)    SpO2 97%    BMI 26.95 kg/m   BP Readings from Last 3 Encounters:  08/13/18 (!) 180/100  07/29/18 (!) 157/74  02/19/18 (!) 150/86    Wt Readings from Last 3 Encounters:  08/13/18 157 lb (71.2  kg)  02/19/18 160 lb 6.4 oz (72.8 kg)  12/20/17 161 lb (73 kg)    Physical Exam Constitutional:      General: She is not in acute distress.    Appearance: She is well-developed.  HENT:     Head: Normocephalic.     Right Ear: External ear normal.     Left Ear: External ear normal.     Nose: Nose normal.  Eyes:     General:        Right eye: No discharge.        Left eye: No discharge.     Conjunctiva/sclera: Conjunctivae normal.     Pupils: Pupils are equal, round, and reactive to light.  Neck:     Musculoskeletal: Normal range of motion and neck supple.     Thyroid: No thyromegaly.     Vascular: No JVD.     Trachea: No tracheal deviation.  Cardiovascular:     Rate and Rhythm: Normal rate and regular rhythm.     Heart sounds: Normal heart sounds.  Pulmonary:     Effort: No respiratory distress.     Breath sounds: No stridor. No wheezing.  Abdominal:     General: Bowel sounds are normal. There is no distension.     Palpations: Abdomen is soft. There is no mass.     Tenderness: There is no abdominal tenderness. There is no guarding or rebound.  Musculoskeletal:        General: No tenderness.  Lymphadenopathy:     Cervical: No cervical adenopathy.  Skin:    Findings: Rash present. No erythema.  Neurological:     Cranial Nerves: No cranial nerve deficit.     Motor: No abnormal muscle tone.     Coordination: Coordination normal.     Deep Tendon Reflexes: Reflexes normal.  Psychiatric:        Behavior: Behavior normal.        Thought Content: Thought content normal.        Judgment: Judgment normal.    NAD Moving slowly Rash - per dtr  Lab Results  Component Value Date   WBC 13.2 (H) 07/29/2018   HGB 11.9 (L) 07/29/2018   HCT 37.1 07/29/2018   PLT 264 07/29/2018   GLUCOSE 117 (H) 07/29/2018   CHOL 254 (H) 11/28/2011   TRIG 257 (H) 11/28/2011   HDL 50 11/28/2011   LDLDIRECT 186.4 02/20/2007   LDLCALC 153 (H) 11/28/2011   ALT 12 07/29/2018   AST 19  07/29/2018   NA 133 (L) 07/29/2018   K 4.3 07/29/2018   CL 104 07/29/2018   CREATININE 1.51 (H) 07/29/2018   BUN 26 (H) 07/29/2018   CO2 21 (L) 07/29/2018   TSH 2.06 12/20/2017  Ct Renal Stone Study  Result Date: 07/29/2018 CLINICAL DATA:  Flank pain, stone disease suspected. Fever, nausea and vomiting. EXAM: CT ABDOMEN AND PELVIS WITHOUT CONTRAST TECHNIQUE: Multidetector CT imaging of the abdomen and pelvis was performed following the standard protocol without IV contrast. COMPARISON:  CT 05/19/2008 FINDINGS: Lower chest: Mild atelectasis. Mild cardiomegaly with coronary artery calcifications. Hepatobiliary: Mild motion artifact. No focal liver abnormality is seen. No gallstones, gallbladder wall thickening, or biliary dilatation. Pancreas: No ductal dilatation or inflammation. Spleen: Normal in size without focal abnormality. Adrenals/Urinary Tract: Normal adrenal glands. Bilateral renal pelvis prominence without ureteral dilatation. No urolithiasis. Limited assessment for perinephric edema due to motion. Indentation of the posterior bladder from pessary. No bladder wall thickening. Stomach/Bowel: Small hiatal hernia. Stomach is nondistended. Appendix appears normal. No evidence of small bowel wall thickening, distention, or inflammatory changes. Sigmoid colonic diverticulosis, mild fat stranding about the mid sigmoid diverticulum. No perforation or abscess. Vascular/Lymphatic: Aortic atherosclerosis without aneurysm. Small retroperitoneal nodes not enlarged by size criteria. Reproductive: Uterus and bilateral adnexa are unremarkable. Pessary in place. Other: No free air, free fluid, or intra-abdominal fluid collection. Small fat containing umbilical hernia. Musculoskeletal: Grade 1 anterolisthesis of L4 on L5 likely facet mediated. Multilevel degenerative change throughout spine. There are no acute or suspicious osseous abnormalities. IMPRESSION: 1. Mild uncomplicated sigmoid diverticulitis. 2.  Bilateral renal pelvis prominence without ureteral dilatation or urolithiasis, likely extrarenal pelvis configuration. 3. Aortic Atherosclerosis (ICD10-I70.0). Coronary artery calcifications. Electronically Signed   By: Keith Rake M.D.   On: 07/29/2018 23:36    Assessment & Plan:   There are no diagnoses linked to this encounter.   No orders of the defined types were placed in this encounter.    Follow-up: No follow-ups on file.  Walker Kehr, MD

## 2018-08-13 NOTE — Assessment & Plan Note (Signed)
Align Lotrisone x 2 weeks Diflucan po

## 2018-08-13 NOTE — Addendum Note (Signed)
Addended by: Boris Lown B on: 08/13/2018 01:54 PM   Modules accepted: Orders

## 2018-08-13 NOTE — Assessment & Plan Note (Signed)
LOC later

## 2018-08-13 NOTE — Assessment & Plan Note (Signed)
Lisinopril Monitor BP at home Labs

## 2018-08-13 NOTE — Assessment & Plan Note (Signed)
C diff test Imodium prn

## 2018-08-14 ENCOUNTER — Telehealth: Payer: Self-pay | Admitting: Internal Medicine

## 2018-08-14 ENCOUNTER — Other Ambulatory Visit: Payer: Medicare Other

## 2018-08-14 DIAGNOSIS — R197 Diarrhea, unspecified: Secondary | ICD-10-CM | POA: Diagnosis not present

## 2018-08-14 NOTE — Telephone Encounter (Signed)
Pts daughter notified that the test an take a few days to run

## 2018-08-14 NOTE — Telephone Encounter (Signed)
Patient's daughter calling to check status of lab results. Specimen was dropped of this morning (08/14/2018). She is requesting call back from CMA to discuss.

## 2018-08-15 ENCOUNTER — Telehealth: Payer: Self-pay | Admitting: Internal Medicine

## 2018-08-15 ENCOUNTER — Inpatient Hospital Stay (HOSPITAL_COMMUNITY)
Admission: EM | Admit: 2018-08-15 | Discharge: 2018-09-07 | DRG: 871 | Disposition: E | Payer: Medicare Other | Attending: Family Medicine | Admitting: Family Medicine

## 2018-08-15 ENCOUNTER — Other Ambulatory Visit: Payer: Self-pay

## 2018-08-15 ENCOUNTER — Emergency Department (HOSPITAL_COMMUNITY): Payer: Medicare Other

## 2018-08-15 DIAGNOSIS — D72823 Leukemoid reaction: Secondary | ICD-10-CM | POA: Diagnosis present

## 2018-08-15 DIAGNOSIS — Z8719 Personal history of other diseases of the digestive system: Secondary | ICD-10-CM | POA: Diagnosis not present

## 2018-08-15 DIAGNOSIS — Z7982 Long term (current) use of aspirin: Secondary | ICD-10-CM

## 2018-08-15 DIAGNOSIS — K449 Diaphragmatic hernia without obstruction or gangrene: Secondary | ICD-10-CM | POA: Diagnosis present

## 2018-08-15 DIAGNOSIS — A414 Sepsis due to anaerobes: Principal | ICD-10-CM | POA: Diagnosis present

## 2018-08-15 DIAGNOSIS — E034 Atrophy of thyroid (acquired): Secondary | ICD-10-CM | POA: Diagnosis present

## 2018-08-15 DIAGNOSIS — Z66 Do not resuscitate: Secondary | ICD-10-CM | POA: Diagnosis not present

## 2018-08-15 DIAGNOSIS — I2510 Atherosclerotic heart disease of native coronary artery without angina pectoris: Secondary | ICD-10-CM | POA: Diagnosis present

## 2018-08-15 DIAGNOSIS — Z888 Allergy status to other drugs, medicaments and biological substances status: Secondary | ICD-10-CM

## 2018-08-15 DIAGNOSIS — R197 Diarrhea, unspecified: Secondary | ICD-10-CM | POA: Diagnosis not present

## 2018-08-15 DIAGNOSIS — G4719 Other hypersomnia: Secondary | ICD-10-CM | POA: Diagnosis not present

## 2018-08-15 DIAGNOSIS — I700 Atherosclerosis of aorta: Secondary | ICD-10-CM | POA: Diagnosis present

## 2018-08-15 DIAGNOSIS — Z20828 Contact with and (suspected) exposure to other viral communicable diseases: Secondary | ICD-10-CM | POA: Diagnosis present

## 2018-08-15 DIAGNOSIS — M8580 Other specified disorders of bone density and structure, unspecified site: Secondary | ICD-10-CM | POA: Diagnosis present

## 2018-08-15 DIAGNOSIS — N183 Chronic kidney disease, stage 3 unspecified: Secondary | ICD-10-CM | POA: Diagnosis present

## 2018-08-15 DIAGNOSIS — E872 Acidosis: Secondary | ICD-10-CM | POA: Diagnosis present

## 2018-08-15 DIAGNOSIS — I10 Essential (primary) hypertension: Secondary | ICD-10-CM | POA: Diagnosis present

## 2018-08-15 DIAGNOSIS — M353 Polymyalgia rheumatica: Secondary | ICD-10-CM | POA: Diagnosis present

## 2018-08-15 DIAGNOSIS — R0689 Other abnormalities of breathing: Secondary | ICD-10-CM | POA: Diagnosis not present

## 2018-08-15 DIAGNOSIS — E871 Hypo-osmolality and hyponatremia: Secondary | ICD-10-CM | POA: Diagnosis present

## 2018-08-15 DIAGNOSIS — E876 Hypokalemia: Secondary | ICD-10-CM | POA: Diagnosis not present

## 2018-08-15 DIAGNOSIS — E43 Unspecified severe protein-calorie malnutrition: Secondary | ICD-10-CM | POA: Diagnosis present

## 2018-08-15 DIAGNOSIS — Z79890 Hormone replacement therapy (postmenopausal): Secondary | ICD-10-CM

## 2018-08-15 DIAGNOSIS — A0472 Enterocolitis due to Clostridium difficile, not specified as recurrent: Secondary | ICD-10-CM | POA: Diagnosis not present

## 2018-08-15 DIAGNOSIS — R188 Other ascites: Secondary | ICD-10-CM | POA: Diagnosis present

## 2018-08-15 DIAGNOSIS — R062 Wheezing: Secondary | ICD-10-CM | POA: Diagnosis not present

## 2018-08-15 DIAGNOSIS — G4709 Other insomnia: Secondary | ICD-10-CM | POA: Diagnosis present

## 2018-08-15 DIAGNOSIS — N179 Acute kidney failure, unspecified: Secondary | ICD-10-CM | POA: Diagnosis present

## 2018-08-15 DIAGNOSIS — E7800 Pure hypercholesterolemia, unspecified: Secondary | ICD-10-CM | POA: Diagnosis present

## 2018-08-15 DIAGNOSIS — Z880 Allergy status to penicillin: Secondary | ICD-10-CM

## 2018-08-15 DIAGNOSIS — G43909 Migraine, unspecified, not intractable, without status migrainosus: Secondary | ICD-10-CM | POA: Diagnosis present

## 2018-08-15 DIAGNOSIS — F418 Other specified anxiety disorders: Secondary | ICD-10-CM | POA: Diagnosis present

## 2018-08-15 DIAGNOSIS — R627 Adult failure to thrive: Secondary | ICD-10-CM | POA: Diagnosis present

## 2018-08-15 DIAGNOSIS — Z79899 Other long term (current) drug therapy: Secondary | ICD-10-CM

## 2018-08-15 DIAGNOSIS — Z7189 Other specified counseling: Secondary | ICD-10-CM | POA: Diagnosis not present

## 2018-08-15 DIAGNOSIS — K5100 Ulcerative (chronic) pancolitis without complications: Secondary | ICD-10-CM | POA: Diagnosis not present

## 2018-08-15 DIAGNOSIS — E860 Dehydration: Secondary | ICD-10-CM | POA: Diagnosis not present

## 2018-08-15 DIAGNOSIS — R410 Disorientation, unspecified: Secondary | ICD-10-CM | POA: Diagnosis not present

## 2018-08-15 DIAGNOSIS — Z6830 Body mass index (BMI) 30.0-30.9, adult: Secondary | ICD-10-CM

## 2018-08-15 DIAGNOSIS — Z791 Long term (current) use of non-steroidal anti-inflammatories (NSAID): Secondary | ICD-10-CM

## 2018-08-15 DIAGNOSIS — Z515 Encounter for palliative care: Secondary | ICD-10-CM | POA: Diagnosis not present

## 2018-08-15 DIAGNOSIS — M1712 Unilateral primary osteoarthritis, left knee: Secondary | ICD-10-CM | POA: Diagnosis present

## 2018-08-15 DIAGNOSIS — R52 Pain, unspecified: Secondary | ICD-10-CM

## 2018-08-15 DIAGNOSIS — Z881 Allergy status to other antibiotic agents status: Secondary | ICD-10-CM

## 2018-08-15 DIAGNOSIS — M1990 Unspecified osteoarthritis, unspecified site: Secondary | ICD-10-CM | POA: Diagnosis present

## 2018-08-15 DIAGNOSIS — I129 Hypertensive chronic kidney disease with stage 1 through stage 4 chronic kidney disease, or unspecified chronic kidney disease: Secondary | ICD-10-CM | POA: Diagnosis present

## 2018-08-15 DIAGNOSIS — Z87440 Personal history of urinary (tract) infections: Secondary | ICD-10-CM | POA: Diagnosis not present

## 2018-08-15 DIAGNOSIS — D72829 Elevated white blood cell count, unspecified: Secondary | ICD-10-CM | POA: Diagnosis not present

## 2018-08-15 DIAGNOSIS — R000 Tachycardia, unspecified: Secondary | ICD-10-CM | POA: Diagnosis not present

## 2018-08-15 LAB — C DIFFICILE QUICK SCREEN W PCR REFLEX
C Diff antigen: POSITIVE — AB
C Diff interpretation: DETECTED
C Diff toxin: POSITIVE — AB

## 2018-08-15 LAB — COMPREHENSIVE METABOLIC PANEL
ALT: 16 U/L (ref 0–44)
AST: 17 U/L (ref 15–41)
Albumin: 3 g/dL — ABNORMAL LOW (ref 3.5–5.0)
Alkaline Phosphatase: 56 U/L (ref 38–126)
Anion gap: 10 (ref 5–15)
BUN: 24 mg/dL — ABNORMAL HIGH (ref 8–23)
CO2: 16 mmol/L — ABNORMAL LOW (ref 22–32)
Calcium: 7.6 mg/dL — ABNORMAL LOW (ref 8.9–10.3)
Chloride: 97 mmol/L — ABNORMAL LOW (ref 98–111)
Creatinine, Ser: 1.35 mg/dL — ABNORMAL HIGH (ref 0.44–1.00)
GFR calc Af Amer: 40 mL/min — ABNORMAL LOW (ref >60)
GFR calc non Af Amer: 35 mL/min — ABNORMAL LOW (ref >60)
Glucose, Bld: 120 mg/dL — ABNORMAL HIGH (ref 70–99)
Potassium: 3.8 mmol/L (ref 3.5–5.1)
Sodium: 123 mmol/L — ABNORMAL LOW (ref 135–145)
Total Bilirubin: 0.5 mg/dL (ref 0.3–1.2)
Total Protein: 6.4 g/dL — ABNORMAL LOW (ref 6.5–8.1)

## 2018-08-15 LAB — CBC WITH DIFFERENTIAL/PLATELET
Abs Immature Granulocytes: 0.24 10*3/uL — ABNORMAL HIGH (ref 0.00–0.07)
Basophils Absolute: 0.1 10*3/uL (ref 0.0–0.1)
Basophils Relative: 0 %
Eosinophils Absolute: 0 10*3/uL (ref 0.0–0.5)
Eosinophils Relative: 0 %
HCT: 40.9 % (ref 36.0–46.0)
Hemoglobin: 13.3 g/dL (ref 12.0–15.0)
Immature Granulocytes: 1 %
Lymphocytes Relative: 4 %
Lymphs Abs: 0.8 10*3/uL (ref 0.7–4.0)
MCH: 29.4 pg (ref 26.0–34.0)
MCHC: 32.5 g/dL (ref 30.0–36.0)
MCV: 90.3 fL (ref 80.0–100.0)
Monocytes Absolute: 1.8 10*3/uL — ABNORMAL HIGH (ref 0.1–1.0)
Monocytes Relative: 8 %
Neutro Abs: 18.8 10*3/uL — ABNORMAL HIGH (ref 1.7–7.7)
Neutrophils Relative %: 87 %
Platelets: 365 10*3/uL (ref 150–400)
RBC: 4.53 MIL/uL (ref 3.87–5.11)
RDW: 12.7 % (ref 11.5–15.5)
WBC: 21.7 10*3/uL — ABNORMAL HIGH (ref 4.0–10.5)
nRBC: 0 % (ref 0.0–0.2)

## 2018-08-15 LAB — MAGNESIUM: Magnesium: 1.4 mg/dL — ABNORMAL LOW (ref 1.7–2.4)

## 2018-08-15 LAB — SARS CORONAVIRUS 2 BY RT PCR (HOSPITAL ORDER, PERFORMED IN ~~LOC~~ HOSPITAL LAB): SARS Coronavirus 2: NEGATIVE

## 2018-08-15 LAB — LIPASE, BLOOD: Lipase: 30 U/L (ref 11–51)

## 2018-08-15 MED ORDER — VANCOMYCIN 50 MG/ML ORAL SOLUTION
125.0000 mg | Freq: Four times a day (QID) | ORAL | Status: DC
  Administered 2018-08-16 (×3): 125 mg via ORAL
  Filled 2018-08-15 (×4): qty 2.5

## 2018-08-15 MED ORDER — IOHEXOL 300 MG/ML  SOLN
100.0000 mL | Freq: Once | INTRAMUSCULAR | Status: AC | PRN
  Administered 2018-08-15: 75 mL via INTRAVENOUS

## 2018-08-15 MED ORDER — ACETAMINOPHEN 325 MG PO TABS
650.0000 mg | ORAL_TABLET | Freq: Four times a day (QID) | ORAL | Status: DC | PRN
  Administered 2018-08-16 – 2018-08-17 (×2): 650 mg via ORAL
  Filled 2018-08-15 (×2): qty 2

## 2018-08-15 MED ORDER — SODIUM CHLORIDE (PF) 0.9 % IJ SOLN
INTRAMUSCULAR | Status: AC
  Filled 2018-08-15: qty 50

## 2018-08-15 MED ORDER — ENOXAPARIN SODIUM 40 MG/0.4ML ~~LOC~~ SOLN
40.0000 mg | Freq: Every day | SUBCUTANEOUS | Status: DC
  Administered 2018-08-17: 40 mg via SUBCUTANEOUS
  Filled 2018-08-15 (×2): qty 0.4

## 2018-08-15 MED ORDER — SODIUM BICARBONATE 8.4 % IV SOLN
INTRAVENOUS | Status: AC
  Administered 2018-08-16: 01:00:00 via INTRAVENOUS
  Filled 2018-08-15 (×2): qty 150

## 2018-08-15 MED ORDER — SODIUM CHLORIDE 0.9 % IV BOLUS
1000.0000 mL | Freq: Once | INTRAVENOUS | Status: AC
  Administered 2018-08-15: 1000 mL via INTRAVENOUS

## 2018-08-15 MED ORDER — MAGNESIUM OXIDE 400 (241.3 MG) MG PO TABS
800.0000 mg | ORAL_TABLET | Freq: Once | ORAL | Status: AC
  Administered 2018-08-16: 800 mg via ORAL
  Filled 2018-08-15: qty 2

## 2018-08-15 MED ORDER — SODIUM CHLORIDE 0.9 % IV SOLN: INTRAVENOUS | Status: DC

## 2018-08-15 MED ORDER — SODIUM CHLORIDE 0.9 % IV BOLUS
1000.0000 mL | Freq: Once | INTRAVENOUS | Status: AC
  Administered 2018-08-15: 17:00:00 1000 mL via INTRAVENOUS

## 2018-08-15 MED ORDER — ONDANSETRON HCL 4 MG/2ML IJ SOLN
4.0000 mg | Freq: Four times a day (QID) | INTRAMUSCULAR | Status: DC | PRN
  Administered 2018-08-16 – 2018-08-19 (×6): 4 mg via INTRAVENOUS
  Filled 2018-08-15 (×7): qty 2

## 2018-08-15 MED ORDER — SODIUM CHLORIDE 0.9 % IV SOLN: Freq: Once | INTRAVENOUS | Status: DC

## 2018-08-15 MED ORDER — ACETAMINOPHEN 650 MG RE SUPP: 650.0000 mg | Freq: Four times a day (QID) | RECTAL | Status: DC | PRN

## 2018-08-15 NOTE — ED Triage Notes (Signed)
Pt BIB EMS from home. Pt speaks [place] only. Per pt family, pt is lethargic, increased weakness, diarrhea x 3 days. Per pt family, pt on antibiotics for various infections.   167/86 CBG 132 Temp 97.2

## 2018-08-15 NOTE — ED Notes (Signed)
Report given to Vicente Males, [place]

## 2018-08-15 NOTE — ED Notes (Signed)
Patient transported to CT 

## 2018-08-15 NOTE — Telephone Encounter (Signed)
Patient's daughter would like a call back to discuss treatment for the patient's diarrhea.  She is going to the bathroom every 2-3hrs.

## 2018-08-15 NOTE — ED Provider Notes (Signed)
[facility] DEPT Provider Note   CSN: [id number] Arrival date & time: 08/23/2018  1605    History   Chief Complaint Chief Complaint  Patient presents with   Fatigue   Weakness   Diarrhea    HPI Anita Banks is a 85 y.o. female.     85 yo F with a chief complaints of diarrhea.  Going on for about 3 to 4 days.  The patient is felt very fatigued and thirsty at home.  History is somewhat limited as the patient speaks [place] and is through a [place] interpreter service.  She has had some lower crampy abdominal pain with this.  No fevers no cough.  No blood in her stool or dark stool.  The history is provided by the patient.  Weakness Severity:  Moderate Onset quality:  Gradual Duration:  4 days Timing:  Constant Progression:  Worsening Chronicity:  New Relieved by:  Nothing Worsened by:  Nothing Ineffective treatments:  None tried Associated symptoms: abdominal pain and diarrhea   Associated symptoms: no arthralgias, no chest pain, no dizziness, no dysuria, no fever, no headaches, no myalgias, no nausea, no shortness of breath, no urgency and no vomiting   Diarrhea Associated symptoms: abdominal pain   Associated symptoms: no arthralgias, no chills, no fever, no headaches, no myalgias and no vomiting     Past Medical History:  Diagnosis Date   Anxiety    Arthritis    Hypertension    Migraine     Patient Active Problem List   Diagnosis Date Noted   C. difficile colitis 08/23/2018   Diarrhea 08/13/2018   Intertrigo 08/13/2018   HTN (hypertension) 08/13/2018   Vitamin D deficiency 12/20/2017   Drug reaction 08/07/2017   Lower extremity edema 08/29/2016   Cough 01/12/2016   Wheezing 01/12/2016   Occipital headache 12/24/2015   Chest pain, atypical 09/13/2015   Acute upper respiratory infection 04/29/2015   Pruritic condition 01/18/2015   Actinic keratoses 09/13/2014   Gait disorder 09/04/2014   Rash  and nonspecific skin eruption 09/04/2014   Polymyalgia rheumatica ([place]) 04/20/2011   INSOMNIA, PERSISTENT 06/26/2007   HYPERCHOLESTEROLEMIA 02/20/2007   Depression with anxiety 02/20/2007   Hypothyroidism due to acquired atrophy of thyroid 12/03/2006   Osteoarthritis of left knee 12/03/2006   OSTEOPENIA 12/03/2006   Migraine variant 11/26/2006   Irritable bowel syndrome 11/26/2006    Past Surgical History:  Procedure Laterality Date   ABDOMINAL HYSTERECTOMY     hemhorroid  2003     OB History   No obstetric history on file.      Home Medications    Prior to Admission medications   Medication Sig Start Date End Date Taking? Authorizing Provider  aspirin 81 MG tablet Take 81 mg by mouth daily.   Yes [provider]  clotrimazole-betamethasone (LOTRISONE) cream Apply 1 application topically 2 (two) times daily. 08/13/18 08/13/19 Yes Plotnikov, Evie Lacks, MD  estradiol (ESTRACE) 0.1 MG/GM vaginal cream Place 2 g vaginally See admin instructions. Every 2 days   Yes [provider]  Fremanezumab-vfrm (AJOVY) 225 MG/1.5ML SOSY Inject 225 mg into the skin every 30 (thirty) days.   Yes [provider]  glucosamine-chondroitin 500-400 MG tablet Take 1 tablet by mouth 2 (two) times daily. Patient taking differently: Take 1 tablet by mouth daily.  01/09/13  Yes Robyn Haber, MD  levothyroxine (SYNTHROID, LEVOTHROID) 112 MCG tablet Take 1 tablet (112 mcg total) by mouth daily. 04/22/18  Yes Plotnikov, Evie Lacks,  MD  lisinopril (PRINIVIL,ZESTRIL) 10 MG tablet TAKE 1 TABLET BY MOUTH IN THE MORNING AND 2 IN THE EVENING Patient taking differently: Take 10-20 mg by mouth See admin instructions. Take 20mg  by mouth in the morning and 10mg  by mouth in the evening 03/25/18  Yes Plotnikov, Evie Lacks, MD  LORazepam (ATIVAN) 1 MG tablet TAKE 1 TABLET AT BEDTIME MAY REPEAT 1 TIME IF NEEDED Patient taking differently: Take 1 mg by mouth at bedtime as needed for sleep.   04/22/18  Yes Plotnikov, Evie Lacks, MD  Omega-3 Fatty Acids (FISH OIL) 1200 MG CAPS Take 1 capsule (1,200 mg total) by mouth daily. 01/09/13  Yes Robyn Haber, MD  polyethylene glycol powder (GLYCOLAX/MIRALAX) powder MIX 1 SCOOP IN LIQUID AND TAKE ONCE DAILY. 10/10/16  Yes Plotnikov, Evie Lacks, MD  propranolol (INDERAL) 10 MG tablet Take 1 tablet (10 mg total) by mouth every morning. 04/22/18  Yes Plotnikov, Evie Lacks, MD  propranolol (INDERAL) 20 MG tablet Take 1 tablet (20 mg total) by mouth 2 (two) times daily. 04/22/18  Yes Plotnikov, Evie Lacks, MD  propranolol (INDERAL) 60 MG tablet Take 60 mg by mouth daily.   Yes [provider]  propranolol ER (INDERAL LA) 60 MG 24 hr capsule Take 1 each by mouth daily. 07/14/18  Yes [provider]  topiramate (TOPAMAX) 50 MG tablet Take 50 mg by mouth daily.   Yes [provider]  Coenzyme Q10 (CO Q-10) 100 MG CAPS Take 1 tablet by mouth daily. Patient not taking: Reported on 08/17/2018 01/09/13   Robyn Haber, MD  diclofenac sodium (VOLTAREN) 1 % GEL Apply topically to affected area qid Patient not taking: Reported on  06/27/17   Gerda Diss, DO  escitalopram (LEXAPRO) 5 MG tablet Take 1 tablet (5 mg total) by mouth daily. Patient not taking: Reported on 08/29/2018 04/22/18   Plotnikov, Evie Lacks, MD  metroNIDAZOLE (FLAGYL) 500 MG tablet Take 1 tablet (500 mg total) by mouth 2 (two) times daily. Patient not taking: Reported on 08/07/2018 07/30/18   Montine Circle, PA-C  mirtazapine (REMERON) 15 MG tablet Take 1 tablet (15 mg total) by mouth at bedtime. Patient not taking: Reported on 09/03/2018 04/22/18   Plotnikov, Evie Lacks, MD  Multiple Vitamins-Minerals (CENTRUM SILVER ADULT 50+) TABS Take 1 tablet by mouth daily. Patient not taking: Reported on 08/17/2018 01/09/13   Robyn Haber, MD  ondansetron (ZOFRAN ODT) 4 MG disintegrating tablet Take 1 tablet (4 mg total) by mouth every 8 (eight) hours as needed for nausea or  vomiting. Patient not taking: Reported on 09/01/2018 07/30/18   Montine Circle, PA-C  triamcinolone cream (KENALOG) 0.1 % Apply 1 application topically 2 (two) times daily. Patient not taking: Reported on 09/06/2018 03/08/17   Plotnikov, Evie Lacks, MD    Family History No family history on file.  Social History Social History   Tobacco Use   Smoking status: Never Smoker   Smokeless tobacco: Never Used  Substance Use Topics   Alcohol use: Not on file   Drug use: No     Allergies   Amitriptyline hcl, Azithromycin, Doxycycline, Erythromycin, Metronidazole, Penicillins, Rizatriptan benzoate, and Tetracyclines & related   Review of Systems Review of Systems  Constitutional: Negative for chills and fever.  HENT: Negative for congestion and rhinorrhea.   Eyes: Negative for redness and visual disturbance.  Respiratory: Negative for shortness of breath and wheezing.   Cardiovascular: Negative for chest pain and palpitations.  Gastrointestinal: Positive for abdominal pain and diarrhea. Negative  for nausea and vomiting.  Genitourinary: Negative for dysuria and urgency.  Musculoskeletal: Negative for arthralgias and myalgias.  Skin: Negative for pallor and wound.  Neurological: Positive for weakness. Negative for dizziness and headaches.     Physical Exam Updated Vital Signs BP (!) 141/81    Pulse 73    Temp 98.2 F (36.8 C) (Oral)    Resp 20    SpO2 98%   Physical Exam Vitals signs and nursing note reviewed.  Constitutional:      General: She is not in acute distress.    Appearance: She is well-developed. She is not diaphoretic.  HENT:     Head: Normocephalic and atraumatic.  Eyes:     Pupils: Pupils are equal, round, and reactive to light.  Neck:     Musculoskeletal: Normal range of motion and neck supple.  Cardiovascular:     Rate and Rhythm: Normal rate and regular rhythm.     Heart sounds: No murmur. No friction rub. No gallop.   Pulmonary:     Effort: Pulmonary  effort is normal.     Breath sounds: No wheezing or rales.  Abdominal:     General: There is no distension.     Palpations: Abdomen is soft.     Tenderness: There is abdominal tenderness.     Comments: Diffuse lower abdominal pain   Musculoskeletal:        General: No tenderness.  Skin:    General: Skin is warm and dry.  Neurological:     Mental Status: She is alert and oriented to person, place, and time.  Psychiatric:        Behavior: Behavior normal.      ED Treatments / Results  Labs (all labs ordered are listed, but only abnormal results are displayed) Labs Reviewed  C DIFFICILE QUICK SCREEN W PCR REFLEX - Abnormal; Notable for the following components:      Result Value   C Diff antigen POSITIVE (*)    C Diff toxin POSITIVE (*)    All other components within normal limits  CBC WITH DIFFERENTIAL/PLATELET - Abnormal; Notable for the following components:   WBC 21.7 (*)    Neutro Abs 18.8 (*)    Monocytes Absolute 1.8 (*)    Abs Immature Granulocytes 0.24 (*)    All other components within normal limits  COMPREHENSIVE METABOLIC PANEL - Abnormal; Notable for the following components:   Sodium 123 (*)    Chloride 97 (*)    CO2 16 (*)    Glucose, Bld 120 (*)    BUN 24 (*)    Creatinine, Ser 1.35 (*)    Calcium 7.6 (*)    Total Protein 6.4 (*)    Albumin 3.0 (*)    GFR calc non Af Amer 35 (*)    GFR calc Af Amer 40 (*)    All other components within normal limits  MAGNESIUM - Abnormal; Notable for the following components:   Magnesium 1.4 (*)    All other components within normal limits  GASTROINTESTINAL PANEL BY PCR, STOOL (REPLACES STOOL CULTURE)  SARS CORONAVIRUS 2 (HOSPITAL ORDER, [facility])  LIPASE, BLOOD  COMPREHENSIVE METABOLIC PANEL  CBC    EKG None  Radiology Ct Abdomen Pelvis W Contrast  Result Date: 09/06/2018 CLINICAL DATA:  Pt BIB EMS from home. Pt speaks [place] only. Per pt family, pt is lethargic, increased  weakness, diarrhea x 3 days. Per pt family, pt on antibiotics for various  infections. EXAM: CT ABDOMEN AND PELVIS WITH CONTRAST TECHNIQUE: Multidetector CT imaging of the abdomen and pelvis was performed using the standard protocol following bolus administration of intravenous contrast. CONTRAST:  164mL OMNIPAQUE IOHEXOL 300 MG/ML  SOLN COMPARISON:  CT of the abdomen and pelvis on 07/29/2018 FINDINGS: Lower chest: The heart is mildly enlarged. There is atherosclerotic calcification of the coronary vessels. Minimal atelectasis identified at the lung bases. Hepatobiliary: No focal liver abnormality is seen. No radiopaque gallstones, biliary dilatation, or pericholecystic inflammatory changes. Pancreas: Unremarkable. No pancreatic ductal dilatation or surrounding inflammatory changes. Spleen: Normal in size without focal abnormality. Adrenals/Urinary Tract: The adrenal glands are normal in appearance. Small sub centimeter cysts are identified within the kidneys. No suspicious renal mass. No hydronephrosis. The ureters are unremarkable. Urinary bladder is unremarkable. Stomach/Bowel: Small hiatal hernia. Stomach is otherwise unremarkable. Small bowel loops are unremarkable. There is marked edema of the colon, throughout its course. There is pericolonic fluid. There is associated mesenteric edema in the findings are consistent with acute colitis. Vascular/Lymphatic: There is atherosclerotic calcification of the abdominal aorta. No associated aneurysm. No retroperitoneal or mesenteric adenopathy. Reproductive: The uterus is present. A pessary is in place. No adnexal mass. Other: There is a small amount of ascites, extending into and RIGHT inguinal hernia. Musculoskeletal: Moderate degenerative changes. There is anterolisthesis of L4 on L5. There is retrolisthesis of L1 on L2. IMPRESSION: 1. Marked edema of the colon throughout its course with pericolonic fluid and mesenteric edema. Findings are consistent with acute  pancolitis, most likely infectious. 2. Small amount of ascites, extending into and RIGHT inguinal hernia. 3. Small hiatal hernia. 4.  Aortic atherosclerosis.  (ICD10-I70.0) 5. Coronary artery disease. Electronically Signed   By: Nolon Nations M.D.   On: 08/19/2018 19:21    Procedures Procedures (including critical care time)  Medications Ordered in ED Medications  sodium chloride (PF) 0.9 % injection (has no administration in time range)  magnesium oxide (MAG-OX) tablet 800 mg (has no administration in time range)  vancomycin (VANCOCIN) 50 mg/mL oral solution 125 mg (has no administration in time range)  enoxaparin (LOVENOX) injection 40 mg (has no administration in time range)  acetaminophen (TYLENOL) tablet 650 mg (has no administration in time range)    Or  acetaminophen (TYLENOL) suppository 650 mg (has no administration in time range)  sodium bicarbonate 150 mEq in dextrose 5 % 1,000 mL infusion (has no administration in time range)  sodium chloride 0.9 % bolus 1,000 mL (0 mLs Intravenous Stopped 08/29/2018 1836)  iohexol (OMNIPAQUE) 300 MG/ML solution 100 mL (75 mLs Intravenous Contrast Given 08/23/2018 1902)  sodium chloride 0.9 % bolus 1,000 mL (0 mLs Intravenous Stopped 09/06/2018 2143)     Initial Impression / Assessment and Plan / ED Course  I have reviewed the triage vital signs and the nursing notes.  Pertinent labs & imaging results that were available during my care of the patient were reviewed by me and considered in my medical decision making (see chart for details).        85 yo F with a chief complaint of diarrhea and crampy lower abdominal pain.  Came in for weakness.  Patient has a leukocytosis of 21,000, acidosis without anion gap.  Will obtain a CT scan to further evaluate.  CT with pancolitis.  Hx concerning for cdiff.  Lab finally resulted positive.  Start on oral vanc.  Hospitalist admit.   The patients results and plan were reviewed and discussed.   Any x-rays  performed  were independently reviewed by myself.   Differential diagnosis were considered with the presenting HPI.  Medications  sodium chloride (PF) 0.9 % injection (has no administration in time range)  magnesium oxide (MAG-OX) tablet 800 mg (has no administration in time range)  vancomycin (VANCOCIN) 50 mg/mL oral solution 125 mg (has no administration in time range)  enoxaparin (LOVENOX) injection 40 mg (has no administration in time range)  acetaminophen (TYLENOL) tablet 650 mg (has no administration in time range)    Or  acetaminophen (TYLENOL) suppository 650 mg (has no administration in time range)  sodium bicarbonate 150 mEq in dextrose 5 % 1,000 mL infusion (has no administration in time range)  sodium chloride 0.9 % bolus 1,000 mL (0 mLs Intravenous Stopped 09/01/2018 1836)  iohexol (OMNIPAQUE) 300 MG/ML solution 100 mL (75 mLs Intravenous Contrast Given 08/23/2018 1902)  sodium chloride 0.9 % bolus 1,000 mL (0 mLs Intravenous Stopped 08/23/2018 2143)    Vitals:   08/10/2018 2031 08/27/2018 2100 09/03/2018 2130 08/10/2018 2200  BP: 136/80 128/74 (!) 122/92 (!) 141/81  Pulse: 73 74 70 73  Resp: 18 18 20 20   Temp:      TempSrc:      SpO2: 100% 100% 99% 98%    Final diagnoses:  C. difficile colitis  Dehydration    Admission/ observation were discussed with the admitting physician, patient and/or family and they are comfortable with the plan.    Final Clinical Impressions(s) / ED Diagnoses   Final diagnoses:  C. difficile colitis  Dehydration    ED Discharge Orders    None       Deno Etienne, DO 08/31/2018 2232

## 2018-08-16 ENCOUNTER — Encounter (HOSPITAL_COMMUNITY): Payer: Self-pay | Admitting: Internal Medicine

## 2018-08-16 DIAGNOSIS — N183 Chronic kidney disease, stage 3 unspecified: Secondary | ICD-10-CM | POA: Diagnosis present

## 2018-08-16 DIAGNOSIS — E871 Hypo-osmolality and hyponatremia: Secondary | ICD-10-CM

## 2018-08-16 DIAGNOSIS — I10 Essential (primary) hypertension: Secondary | ICD-10-CM

## 2018-08-16 DIAGNOSIS — E034 Atrophy of thyroid (acquired): Secondary | ICD-10-CM

## 2018-08-16 DIAGNOSIS — E860 Dehydration: Secondary | ICD-10-CM | POA: Diagnosis present

## 2018-08-16 DIAGNOSIS — N179 Acute kidney failure, unspecified: Secondary | ICD-10-CM | POA: Diagnosis present

## 2018-08-16 LAB — COMPREHENSIVE METABOLIC PANEL
ALT: 16 U/L (ref 0–44)
AST: 19 U/L (ref 15–41)
Albumin: 2.8 g/dL — ABNORMAL LOW (ref 3.5–5.0)
Alkaline Phosphatase: 62 U/L (ref 38–126)
Anion gap: 13 (ref 5–15)
BUN: 17 mg/dL (ref 8–23)
CO2: 15 mmol/L — ABNORMAL LOW (ref 22–32)
Calcium: 7.7 mg/dL — ABNORMAL LOW (ref 8.9–10.3)
Chloride: 100 mmol/L (ref 98–111)
Creatinine, Ser: 1.15 mg/dL — ABNORMAL HIGH (ref 0.44–1.00)
GFR calc Af Amer: 49 mL/min — ABNORMAL LOW (ref >60)
GFR calc non Af Amer: 42 mL/min — ABNORMAL LOW (ref >60)
Glucose, Bld: 123 mg/dL — ABNORMAL HIGH (ref 70–99)
Potassium: 3.8 mmol/L (ref 3.5–5.1)
Sodium: 128 mmol/L — ABNORMAL LOW (ref 135–145)
Total Bilirubin: 0.5 mg/dL (ref 0.3–1.2)
Total Protein: 6.2 g/dL — ABNORMAL LOW (ref 6.5–8.1)

## 2018-08-16 LAB — GASTROINTESTINAL PANEL BY PCR, STOOL (REPLACES STOOL CULTURE)

## 2018-08-16 LAB — CBC
HCT: 41.8 % (ref 36.0–46.0)
Hemoglobin: 13.8 g/dL (ref 12.0–15.0)
MCH: 29.2 pg (ref 26.0–34.0)
MCHC: 33 g/dL (ref 30.0–36.0)
MCV: 88.6 fL (ref 80.0–100.0)
Platelets: 306 10*3/uL (ref 150–400)
RBC: 4.72 MIL/uL (ref 3.87–5.11)
RDW: 12.8 % (ref 11.5–15.5)
WBC: 30.2 10*3/uL — ABNORMAL HIGH (ref 4.0–10.5)
nRBC: 0 % (ref 0.0–0.2)

## 2018-08-16 LAB — GLUCOSE, CAPILLARY: Glucose-Capillary: 117 mg/dL — ABNORMAL HIGH (ref 70–99)

## 2018-08-16 LAB — SPECIMEN STATUS REPORT

## 2018-08-16 LAB — CLOSTRIDIUM DIFFICILE EIA: C difficile Toxins A+B, EIA: POSITIVE — AB

## 2018-08-16 MED ORDER — TOPIRAMATE 25 MG PO TABS
50.0000 mg | ORAL_TABLET | Freq: Every day | ORAL | Status: DC
  Administered 2018-08-16 – 2018-08-17 (×2): 50 mg via ORAL
  Filled 2018-08-16 (×2): qty 2

## 2018-08-16 MED ORDER — SODIUM CHLORIDE 0.9 % IV BOLUS
1000.0000 mL | Freq: Once | INTRAVENOUS | Status: AC
  Administered 2018-08-16: 1000 mL via INTRAVENOUS

## 2018-08-16 MED ORDER — LISINOPRIL 20 MG PO TABS: 20.0000 mg | ORAL_TABLET | Freq: Every day | ORAL | Status: DC

## 2018-08-16 MED ORDER — LISINOPRIL 10 MG PO TABS: 10.0000 mg | ORAL_TABLET | Freq: Every day | ORAL | Status: DC

## 2018-08-16 MED ORDER — LORAZEPAM 1 MG PO TABS
1.0000 mg | ORAL_TABLET | Freq: Every evening | ORAL | Status: DC | PRN
  Administered 2018-08-17 – 2018-08-18 (×2): 1 mg via ORAL
  Filled 2018-08-16 (×2): qty 1

## 2018-08-16 MED ORDER — VANCOMYCIN 50 MG/ML ORAL SOLUTION
500.0000 mg | Freq: Four times a day (QID) | ORAL | Status: DC
  Administered 2018-08-16 – 2018-08-17 (×3): 500 mg via ORAL
  Filled 2018-08-16 (×6): qty 10

## 2018-08-16 MED ORDER — METRONIDAZOLE IN NACL 5-0.79 MG/ML-% IV SOLN
500.0000 mg | Freq: Three times a day (TID) | INTRAVENOUS | Status: DC
  Administered 2018-08-16 – 2018-08-19 (×9): 500 mg via INTRAVENOUS
  Filled 2018-08-16 (×9): qty 100

## 2018-08-16 MED ORDER — PROPRANOLOL HCL ER 60 MG PO CP24
60.0000 mg | ORAL_CAPSULE | Freq: Every day | ORAL | Status: DC
  Administered 2018-08-16 – 2018-08-19 (×4): 60 mg via ORAL
  Filled 2018-08-16 (×5): qty 1

## 2018-08-16 MED ORDER — ESTRADIOL 0.1 MG/GM VA CREA
2.0000 g | TOPICAL_CREAM | VAGINAL | Status: DC
  Filled 2018-08-16: qty 42.5

## 2018-08-16 MED ORDER — LISINOPRIL 10 MG PO TABS: 10.0000 mg | ORAL_TABLET | ORAL | Status: DC

## 2018-08-16 MED ORDER — DEXTROSE IN LACTATED RINGERS 5 % IV SOLN
INTRAVENOUS | Status: DC
  Administered 2018-08-16 (×2): via INTRAVENOUS

## 2018-08-16 MED ORDER — LORAZEPAM 1 MG PO TABS
1.0000 mg | ORAL_TABLET | Freq: Once | ORAL | Status: AC
  Administered 2018-08-16: 1 mg via ORAL
  Filled 2018-08-16: qty 1

## 2018-08-16 MED ORDER — PRO-STAT SUGAR FREE PO LIQD
30.0000 mL | Freq: Two times a day (BID) | ORAL | Status: DC
  Administered 2018-08-16 – 2018-08-19 (×6): 30 mL via ORAL
  Filled 2018-08-16 (×7): qty 30

## 2018-08-16 MED ORDER — LEVOTHYROXINE SODIUM 112 MCG PO TABS
112.0000 ug | ORAL_TABLET | Freq: Every day | ORAL | Status: DC
  Administered 2018-08-17 – 2018-08-19 (×3): 112 ug via ORAL
  Filled 2018-08-16 (×3): qty 1

## 2018-08-16 NOTE — Progress Notes (Addendum)
PROGRESS NOTE  Anita Banks XLK:[id number] DOB: 01/08/1928 DOA: 08/12/2018 PCP: [name], MD  HPI/Recap of past 24 hours:  Needs translator for communication She reports being weak, feeling nauseous, she denies ab pain, no active vomiting  Assessment/Plan: Principal Problem:   C. difficile colitis Active Problems:   Hypothyroidism due to acquired atrophy of thyroid   HYPERCHOLESTEROLEMIA   HTN (hypertension)   Dehydration  Cdiff colitis/spesis presented on admission, with fever, leukocytosis, tacypnea -she was recently on abx for diverticulitis which was diagnosed at [facility] ED on 6/22 -CT ab "1. Marked edema of the colon throughout its course with pericolonic fluid and mesenteric edema. Findings are consistent with acute pancolitis, most likely infectious.", will consult GI -she is started on oral vanc, ivf -Clears as tolerated  Addendum: Case discussed with [facility] Dr Percell Miller who recommend increase oral vanc to 500mg  QID and add on iv flagyl due to severity of the c diff Dr Percell Miller will formally consult.   AKI on CKDIII -cr 1.4 on presentation, cr improved to 1.15 after hydration -continue hydration, d/c lisinopril -repeat bmp in am  Hyponatremia -likely from dehydration -continue ivf  Metabolic acidosis: likely from diarrhea -on bicarb drip  HTN:  Continue propranolol, hold lisinopril   Hypothyroidism: Continue synthroid  Baseline independent per daughter  Code Status: full  Family Communication: patient with translator, daughter over the phone  Disposition Plan: not ready to discharge, severe c diff colitis at risk of decompensation    Consultants:  GI  Procedures:  none  Antibiotics:  Oral vanc   Objective: BP (!) 125/59 (BP Location: Left Arm)   Pulse 77   Temp 98 F (36.7 C) (Oral)   Resp 20   Ht 5\' 3"  (1.6 m)   Wt 74.5 kg   SpO2 100%   BMI 29.09 kg/m   Intake/Output Summary (Last 24 hours) at 08/16/2018 1218  Last data filed at 08/16/2018 3664 Gross per 24 hour  Intake 576.66 ml  Output 4 ml  Net 572.66 ml   Filed Weights   08/16/18 0033 08/16/18 0500  Weight: 73.6 kg 74.5 kg    Exam: Patient is examined daily including today on 08/16/2018, exams remain the same as of yesterday except that has changed    General:  Lethargic and weak, but oriented x3  Cardiovascular: RRR  Respiratory: CTABL  Abdomen: Soft/ND/NT, positive BS  Musculoskeletal: No Edema  Neuro: Lethargic and weak, but oriented x3  Data Reviewed: Basic Metabolic Panel: Recent Labs  Lab 08/13/18 1355 08/28/2018 1633 08/16/18 0759  NA 134* 123* 128*  K 4.0 3.8 3.8  CL 105 97* 100  CO2 20 16* 15*  GLUCOSE 99 120* 123*  BUN 32* 24* 17  CREATININE 1.42* 1.35* 1.15*  CALCIUM 9.1 7.6* 7.7*  MG  --  1.4*  --    Liver Function Tests: Recent Labs  Lab 08/30/2018 1633 08/16/18 0759  AST 17 19  ALT 16 16  ALKPHOS 56 62  BILITOT 0.5 0.5  PROT 6.4* 6.2*  ALBUMIN 3.0* 2.8*   Recent Labs  Lab 08/21/2018 1633  LIPASE 30   No results for input(s): AMMONIA in the last 168 hours. CBC: Recent Labs  Lab 09/05/2018 1633 08/16/18 0535  WBC 21.7* 30.2*  NEUTROABS 18.8*  --   HGB 13.3 13.8  HCT 40.9 41.8  MCV 90.3 88.6  PLT 365 306   Cardiac Enzymes:   No results for input(s): CKTOTAL, CKMB, CKMBINDEX, TROPONINI in the last 168  hours. BNP (last 3 results) No results for input(s): BNP in the last 8760 hours.  ProBNP (last 3 results) No results for input(s): PROBNP in the last 8760 hours.  CBG: Recent Labs  Lab 08/16/18 0730  GLUCAP 117*    Recent Results (from the past 240 hour(s))  Clostridium difficile EIA     Status: Abnormal   Collection Time: 08/14/18 12:00 AM   Specimen: Stool   STOOL  Result Value Ref Range Status   C difficile Toxins A+B, EIA Positive (A) Negative Final  Gastrointestinal Panel by PCR , Stool     Status: None   Collection Time: 08/22/2018  8:31 PM   Specimen: Stool  Result  Value Ref Range Status   Campylobacter species NOT DETECTED NOT DETECTED Final   Plesimonas shigelloides NOT DETECTED NOT DETECTED Final   Salmonella species NOT DETECTED NOT DETECTED Final   Yersinia enterocolitica NOT DETECTED NOT DETECTED Final   Vibrio species NOT DETECTED NOT DETECTED Final   Vibrio cholerae NOT DETECTED NOT DETECTED Final   Enteroaggregative E coli (EAEC) NOT DETECTED NOT DETECTED Final   Enteropathogenic E coli (EPEC) NOT DETECTED NOT DETECTED Final   Enterotoxigenic E coli (ETEC) NOT DETECTED NOT DETECTED Final   Shiga like toxin producing E coli (STEC) NOT DETECTED NOT DETECTED Final   Shigella/Enteroinvasive E coli (EIEC) NOT DETECTED NOT DETECTED Final   Cryptosporidium NOT DETECTED NOT DETECTED Final   Cyclospora cayetanensis NOT DETECTED NOT DETECTED Final   Entamoeba histolytica NOT DETECTED NOT DETECTED Final   Giardia lamblia NOT DETECTED NOT DETECTED Final   Adenovirus F40/41 NOT DETECTED NOT DETECTED Final   Astrovirus NOT DETECTED NOT DETECTED Final   Norovirus GI/GII NOT DETECTED NOT DETECTED Final   Rotavirus A NOT DETECTED NOT DETECTED Final   Sapovirus (I, II, IV, and V) NOT DETECTED NOT DETECTED Final    Comment: Performed at [facility], [address] [place], [place] [address]  C Difficile Quick Screen w PCR reflex     Status: Abnormal   Collection Time: 08/30/2018  8:31 PM   Specimen: Stool  Result Value Ref Range Status   C Diff antigen POSITIVE (A) NEGATIVE Final   C Diff toxin POSITIVE (A) NEGATIVE Final   C Diff interpretation Toxin producing C. difficile detected.  Final    Comment: CRITICAL RESULT CALLED TO, READ BACK BY AND VERIFIED WITHAlfonso Patten GROVES RN 2124 08/29/2018 A NAVARRO Performed at [facility], [place] [address] [place], [place] [address]   SARS Coronavirus 2 (CEPHEID - Performed in [place] hospital lab), Hosp Order     Status: None   Collection Time: 08/16/2018  9:23 PM   Specimen:  Nasopharyngeal Swab  Result Value Ref Range Status   SARS Coronavirus 2 NEGATIVE NEGATIVE Final    Comment: (NOTE) If result is NEGATIVE SARS-CoV-2 target nucleic acids are NOT DETECTED. The SARS-CoV-2 RNA is generally detectable in upper and lower  respiratory specimens during the acute phase of infection. The lowest  concentration of SARS-CoV-2 viral copies this assay can detect is 250  copies / mL. A negative result does not preclude SARS-CoV-2 infection  and should not be used as the sole basis for treatment or other  patient management decisions.  A negative result may occur with  improper specimen collection / handling, submission of specimen other  than nasopharyngeal swab, presence of viral mutation(s) within the  areas targeted by this assay, and inadequate number of viral copies  (<250  copies / mL). A negative result must be combined with clinical  observations, patient history, and epidemiological information. If result is POSITIVE SARS-CoV-2 target nucleic acids are DETECTED. The SARS-CoV-2 RNA is generally detectable in upper and lower  respiratory specimens dur ing the acute phase of infection.  Positive  results are indicative of active infection with SARS-CoV-2.  Clinical  correlation with patient history and other diagnostic information is  necessary to determine patient infection status.  Positive results do  not rule out bacterial infection or co-infection with other viruses. If result is PRESUMPTIVE POSTIVE SARS-CoV-2 nucleic acids MAY BE PRESENT.   A presumptive positive result was obtained on the submitted specimen  and confirmed on repeat testing.  While 2019 novel coronavirus  (SARS-CoV-2) nucleic acids may be present in the submitted sample  additional confirmatory testing may be necessary for epidemiological  and / or clinical management purposes  to differentiate between  SARS-CoV-2 and other Sarbecovirus currently known to infect humans.  If clinically  indicated additional testing with an alternate test  methodology [phone]) is advised. The SARS-CoV-2 RNA is generally  detectable in upper and lower respiratory sp ecimens during the acute  phase of infection. The expected result is Negative. Fact Sheet for Patients:  StrictlyIdeas.no Fact Sheet for Healthcare Providers: BankingDealers.co.za This test is not yet approved or cleared by the [place] FDA and has been authorized for detection and/or diagnosis of SARS-CoV-2 by FDA under an Emergency Use Authorization (EUA).  This EUA will remain in effect (meaning this test can be used) for the duration of the COVID-19 declaration under Section 564(b)(1) of the Act, 21 U.S.C. section 360bbb-3(b)(1), unless the authorization is terminated or revoked sooner. Performed at [facility], [place] [address], [place], [place] [address]      Studies: Ct Abdomen Pelvis W Contrast  Result Date: 08/07/2018 CLINICAL DATA:  Pt BIB EMS from home. Pt speaks [place] only. Per pt family, pt is lethargic, increased weakness, diarrhea x 3 days. Per pt family, pt on antibiotics for various infections. EXAM: CT ABDOMEN AND PELVIS WITH CONTRAST TECHNIQUE: Multidetector CT imaging of the abdomen and pelvis was performed using the standard protocol following bolus administration of intravenous contrast. CONTRAST:  112mL OMNIPAQUE IOHEXOL 300 MG/ML  SOLN COMPARISON:  CT of the abdomen and pelvis on 07/29/2018 FINDINGS: Lower chest: The heart is mildly enlarged. There is atherosclerotic calcification of the coronary vessels. Minimal atelectasis identified at the lung bases. Hepatobiliary: No focal liver abnormality is seen. No radiopaque gallstones, biliary dilatation, or pericholecystic inflammatory changes. Pancreas: Unremarkable. No pancreatic ductal dilatation or surrounding inflammatory changes. Spleen: Normal in size without focal abnormality.  Adrenals/Urinary Tract: The adrenal glands are normal in appearance. Small sub centimeter cysts are identified within the kidneys. No suspicious renal mass. No hydronephrosis. The ureters are unremarkable. Urinary bladder is unremarkable. Stomach/Bowel: Small hiatal hernia. Stomach is otherwise unremarkable. Small bowel loops are unremarkable. There is marked edema of the colon, throughout its course. There is pericolonic fluid. There is associated mesenteric edema in the findings are consistent with acute colitis. Vascular/Lymphatic: There is atherosclerotic calcification of the abdominal aorta. No associated aneurysm. No retroperitoneal or mesenteric adenopathy. Reproductive: The uterus is present. A pessary is in place. No adnexal mass. Other: There is a small amount of ascites, extending into and RIGHT inguinal hernia. Musculoskeletal: Moderate degenerative changes. There is anterolisthesis of L4 on L5. There is retrolisthesis of L1 on L2. IMPRESSION: 1. Marked edema of the colon throughout its course  with pericolonic fluid and mesenteric edema. Findings are consistent with acute pancolitis, most likely infectious. 2. Small amount of ascites, extending into and RIGHT inguinal hernia. 3. Small hiatal hernia. 4.  Aortic atherosclerosis.  (ICD10-I70.0) 5. Coronary artery disease. Electronically Signed   By: Nolon Nations M.D.   On: 08/10/2018 19:21    Scheduled Meds: . enoxaparin (LOVENOX) injection  40 mg Subcutaneous Daily  . [START ON 08/17/2018] estradiol  2 g Vaginal Q48H  . feeding supplement (PRO-STAT SUGAR FREE 64)  30 mL Oral BID  . [START ON 08/17/2018] levothyroxine  112 mcg Oral Daily  . propranolol ER  60 mg Oral Daily  . topiramate  50 mg Oral Daily  . vancomycin  125 mg Oral QID    Continuous Infusions: . dextrose 5% lactated ringers    .  sodium bicarbonate  infusion 1000 mL 75 mL/hr at 08/16/18 0700     Time spent: 78mins, case discussed with GI I have personally reviewed and  interpreted on  08/16/2018 daily labs, tele strips, imagings as discussed above under date review session and assessment and plans.  I reviewed all nursing notes, pharmacy notes, consultant notes,  vitals, pertinent old records  I have discussed plan of care as described above with RN , patient and family on 08/16/2018   Florencia Reasons MD, PhD  [facility] Pager [phone]. If 7PM-7AM, please contact night-coverage at [email or web address], password [facility] 08/16/2018, 12:18 PM  LOS: 1 day

## 2018-08-16 NOTE — Progress Notes (Signed)
Pt oriented to room and educated about current medications via phone interpreting service. Pt states she does not want another antibiotic and wants to talk to daughter, York Ram, about medication. Pt's daughter called and verbalizes that she believes her mother is more confused and she desires for her mother to get food and rest. Pt spoke to daughter on phone. Pt's daughter asked for RN to see if her mother would take the medication. Daughter given call back number and this RN asked pt again if she would take medication via interpreter. Pt states she would. Pt given food, denies pain, and no other requests. Pt now resting comfortably.

## 2018-08-16 NOTE — H&P (Signed)
TRH H&P    Patient Demographics:    Anita Banks, is a 85 y.o. female  MRN: [id number]  DOB - 1928/01/20  Admit Date - 08/13/2018  Referring MD/NP/PA: Deno Etienne  Outpatient Primary MD for the patient is Plotnikov, Evie Lacks, MD  Patient coming from:  home  Chief complaint-  diarrhea   HPI:    Anita Banks  is a 85 y.o. female, w hypertension, anxiety, migraines, apparently presents with c/o diarrhea for the past 3 days.  Unable to get clear history from patient.  Pt denies abd pain, n/v, brbpr.   In ED T 98.2  P 80  R 16, Bp 141/75  Pox 100% on RA  CT abd/ pelvis IMPRESSION: 1. Marked edema of the colon throughout its course with pericolonic fluid and mesenteric edema. Findings are consistent with acute pancolitis, most likely infectious. 2. Small amount of ascites, extending into and RIGHT inguinal hernia. 3. Small hiatal hernia. 4.  Aortic atherosclerosis.  (ICD10-I70.0) 5. Coronary artery disease.  Wbc 21.7, hgb 13.3, Plt 365 Na 123, K 3.8, Bun 24, Creatinine 1.35 Glucose 120 Hco3 16 AG 10 Alb 3.0 Ast 17, Alt 16  Pt started on oral vancomycin in the ED for C. Diff colitis.  Pt will be admitted for C. Diff colitis      Review of systems:    In addition to the HPI above,  No Fever-chills, No Headache, No changes with Vision or hearing, No problems swallowing food or Liquids, No Chest pain, Cough or Shortness of Breath, No Abdominal pain, No Nausea or Vomiting,  No Blood in stool or Urine, No dysuria, No new skin rashes or bruises, No new joints pains-aches,  No new weakness, tingling, numbness in any extremity, No recent weight gain or loss, No polyuria, polydypsia or polyphagia, No significant Mental Stressors.  All other systems reviewed and are negative.    Past History of the following :    Past Medical History:  Diagnosis Date   Anxiety     Arthritis    Hypertension    Migraine       Past Surgical History:  Procedure Laterality Date   ABDOMINAL HYSTERECTOMY     hemhorroid  2003      Social History:      Social History   Tobacco Use   Smoking status: Never Smoker   Smokeless tobacco: Never Used  Substance Use Topics   Alcohol use: Not on file       Family History :    History reviewed. No pertinent family history. No family hx of C. Diff colitis   Home Medications:   Prior to Admission medications   Medication Sig Start Date End Date Taking? Authorizing Provider  aspirin 81 MG tablet Take 81 mg by mouth daily.   Yes [provider]  clotrimazole-betamethasone (LOTRISONE) cream Apply 1 application topically 2 (two) times daily. 08/13/18 08/13/19 Yes Plotnikov, Evie Lacks, MD  estradiol (ESTRACE) 0.1 MG/GM vaginal cream Place 2 g vaginally See admin instructions. Every 2 days   Yes  [provider]  Fremanezumab-vfrm (AJOVY) 225 MG/1.5ML SOSY Inject 225 mg into the skin every 30 (thirty) days.   Yes [provider]  glucosamine-chondroitin 500-400 MG tablet Take 1 tablet by mouth 2 (two) times daily. Patient taking differently: Take 1 tablet by mouth daily.  01/09/13  Yes Robyn Haber, MD  levothyroxine (SYNTHROID, LEVOTHROID) 112 MCG tablet Take 1 tablet (112 mcg total) by mouth daily. 04/22/18  Yes Plotnikov, Evie Lacks, MD  lisinopril (PRINIVIL,ZESTRIL) 10 MG tablet TAKE 1 TABLET BY MOUTH IN THE MORNING AND 2 IN THE EVENING Patient taking differently: Take 10-20 mg by mouth See admin instructions. Take 20mg  by mouth in the morning and 10mg  by mouth in the evening 03/25/18  Yes Plotnikov, Evie Lacks, MD  LORazepam (ATIVAN) 1 MG tablet TAKE 1 TABLET AT BEDTIME MAY REPEAT 1 TIME IF NEEDED Patient taking differently: Take 1 mg by mouth at bedtime as needed for sleep.  04/22/18  Yes Plotnikov, Evie Lacks, MD  Omega-3 Fatty Acids (FISH OIL) 1200 MG CAPS Take 1 capsule (1,200 mg total) by  mouth daily. 01/09/13  Yes Robyn Haber, MD  polyethylene glycol powder (GLYCOLAX/MIRALAX) powder MIX 1 SCOOP IN LIQUID AND TAKE ONCE DAILY. 10/10/16  Yes Plotnikov, Evie Lacks, MD  propranolol (INDERAL) 10 MG tablet Take 1 tablet (10 mg total) by mouth every morning. 04/22/18  Yes Plotnikov, Evie Lacks, MD  propranolol (INDERAL) 20 MG tablet Take 1 tablet (20 mg total) by mouth 2 (two) times daily. 04/22/18  Yes Plotnikov, Evie Lacks, MD  propranolol (INDERAL) 60 MG tablet Take 60 mg by mouth daily.   Yes [provider]  propranolol ER (INDERAL LA) 60 MG 24 hr capsule Take 1 each by mouth daily. 07/14/18  Yes [provider]  topiramate (TOPAMAX) 50 MG tablet Take 50 mg by mouth daily.   Yes [provider]  Coenzyme Q10 (CO Q-10) 100 MG CAPS Take 1 tablet by mouth daily. Patient not taking: Reported on 08/28/2018 01/09/13   Robyn Haber, MD  diclofenac sodium (VOLTAREN) 1 % GEL Apply topically to affected area qid Patient not taking: Reported on 08/26/2018 06/27/17   Gerda Diss, DO  escitalopram (LEXAPRO) 5 MG tablet Take 1 tablet (5 mg total) by mouth daily. Patient not taking: Reported on 08/21/2018 04/22/18   Plotnikov, Evie Lacks, MD  metroNIDAZOLE (FLAGYL) 500 MG tablet Take 1 tablet (500 mg total) by mouth 2 (two) times daily. Patient not taking: Reported on 09/03/2018 07/30/18   Montine Circle, PA-C  mirtazapine (REMERON) 15 MG tablet Take 1 tablet (15 mg total) by mouth at bedtime. Patient not taking: Reported on 08/19/2018 04/22/18   Plotnikov, Evie Lacks, MD  Multiple Vitamins-Minerals (CENTRUM SILVER ADULT 50+) TABS Take 1 tablet by mouth daily. Patient not taking: Reported on 09/04/2018 01/09/13   Robyn Haber, MD  ondansetron (ZOFRAN ODT) 4 MG disintegrating tablet Take 1 tablet (4 mg total) by mouth every 8 (eight) hours as needed for nausea or vomiting. Patient not taking: Reported on 08/26/2018 07/30/18   Montine Circle, PA-C  triamcinolone cream (KENALOG) 0.1  % Apply 1 application topically 2 (two) times daily. Patient not taking: Reported on 08/07/2018 03/08/17   Plotnikov, Evie Lacks, MD     Allergies:     Allergies  Allergen Reactions   Amitriptyline Hcl    Azithromycin Hives    Per pt report - It is unclear if a little rash on the leg was related to Z pac or not.  Doxycycline     nausea   Erythromycin    Metronidazole    Penicillins     Can take keflex ok   Rizatriptan Benzoate    Tetracyclines & Related      Physical Exam:   Vitals  Blood pressure (!) 119/55, pulse 68, temperature (!) 97.5 F (36.4 C), temperature source Oral, resp. rate 20, height 5\' 3"  (1.6 m), weight 73.6 kg, SpO2 96 %.  1.  General: axox3  2. Psychiatric: euthymic  3. Neurologic: cn2-12 intact, reflexes 2+ symmetric, diffuse with no clonus, motor 5/5 in all 4 ext  4. HEENMT:  Anicteric, pupils 1.23mm symmetric, direct consensual intact Neck: no jvd   5. Respiratory : CTAB  6. Cardiovascular : rrr s1, s2,   7. Gastrointestinal:  Abd: soft, nt, nd, +bs  8. Skin:  Ext: no c/c/e,  No rash  9.Musculoskeletal:  Good ROM,  No adenopathy      Data Review:    CBC Recent Labs  Lab 08/20/2018 1633  WBC 21.7*  HGB 13.3  HCT 40.9  PLT 365  MCV 90.3  MCH 29.4  MCHC 32.5  RDW 12.7  LYMPHSABS 0.8  MONOABS 1.8*  EOSABS 0.0  BASOSABS 0.1   ------------------------------------------------------------------------------------------------------------------  Results for orders placed or performed during the hospital encounter of 09/03/2018 (from the past 48 hour(s))  CBC with Differential     Status: Abnormal   Collection Time: 08/13/2018  4:33 PM  Result Value Ref Range   WBC 21.7 (H) 4.0 - 10.5 K/uL   RBC 4.53 3.87 - 5.11 MIL/uL   Hemoglobin 13.3 12.0 - 15.0 g/dL   HCT 40.9 36.0 - 46.0 %   MCV 90.3 80.0 - 100.0 fL   MCH 29.4 26.0 - 34.0 pg   MCHC 32.5 30.0 - 36.0 g/dL   RDW 12.7 11.5 - 15.5 %   Platelets 365 150 - 400 K/uL    nRBC 0.0 0.0 - 0.2 %   Neutrophils Relative % 87 %   Neutro Abs 18.8 (H) 1.7 - 7.7 K/uL   Lymphocytes Relative 4 %   Lymphs Abs 0.8 0.7 - 4.0 K/uL   Monocytes Relative 8 %   Monocytes Absolute 1.8 (H) 0.1 - 1.0 K/uL   Eosinophils Relative 0 %   Eosinophils Absolute 0.0 0.0 - 0.5 K/uL   Basophils Relative 0 %   Basophils Absolute 0.1 0.0 - 0.1 K/uL   Immature Granulocytes 1 %   Abs Immature Granulocytes 0.24 (H) 0.00 - 0.07 K/uL    Comment: Performed at [facility], [place] [address] [place],  [address]  Comprehensive metabolic panel     Status: Abnormal   Collection Time: 08/23/2018  4:33 PM  Result Value Ref Range   Sodium 123 (L) 135 - 145 mmol/L   Potassium 3.8 3.5 - 5.1 mmol/L   Chloride 97 (L) 98 - 111 mmol/L   CO2 16 (L) 22 - 32 mmol/L   Glucose, Bld 120 (H) 70 - 99 mg/dL   BUN 24 (H) 8 - 23 mg/dL   Creatinine, Ser 1.35 (H) 0.44 - 1.00 mg/dL   Calcium 7.6 (L) 8.9 - 10.3 mg/dL   Total Protein 6.4 (L) 6.5 - 8.1 g/dL   Albumin 3.0 (L) 3.5 - 5.0 g/dL   AST 17 15 - 41 U/L   ALT 16 0 - 44 U/L   Alkaline Phosphatase 56 38 - 126 U/L   Total Bilirubin 0.5 0.3 - 1.2 mg/dL   GFR  calc non Af Amer 35 (L) >60 mL/min   GFR calc Af Amer 40 (L) >60 mL/min   Anion gap 10 5 - 15    Comment: Performed at [facility], [place] [address], [place], [place] [address]  Lipase, blood     Status: None   Collection Time: 09/02/2018  4:33 PM  Result Value Ref Range   Lipase 30 11 - 51 U/L    Comment: Performed at [facility], [place] [address] [place], [place] [address]  Magnesium     Status: Abnormal   Collection Time: 08/10/2018  4:33 PM  Result Value Ref Range   Magnesium 1.4 (L) 1.7 - 2.4 mg/dL    Comment: Performed at [facility], [place] [address], [place], [place] [address]  C Difficile Quick Screen w PCR reflex     Status: Abnormal   Collection Time: 08/11/2018  8:31 PM   Specimen: Stool  Result Value Ref Range   C  Diff antigen POSITIVE (A) NEGATIVE   C Diff toxin POSITIVE (A) NEGATIVE   C Diff interpretation Toxin producing C. difficile detected.     Comment: CRITICAL RESULT CALLED TO, READ BACK BY AND VERIFIED WITHAlfonso Patten GROVES RN [id number] 08/11/2018 A NAVARRO Performed at [facility], [place] [address] [place],  [address]   SARS Coronavirus 2 (CEPHEID - Performed in [place] hospital lab), Hosp Order     Status: None   Collection Time: 08/31/2018  9:23 PM   Specimen: Nasopharyngeal Swab  Result Value Ref Range   SARS Coronavirus 2 NEGATIVE NEGATIVE    Comment: (NOTE) If result is NEGATIVE SARS-CoV-2 target nucleic acids are NOT DETECTED. The SARS-CoV-2 RNA is generally detectable in upper and lower  respiratory specimens during the acute phase of infection. The lowest  concentration of SARS-CoV-2 viral copies this assay can detect is 250  copies / mL. A negative result does not preclude SARS-CoV-2 infection  and should not be used as the sole basis for treatment or other  patient management decisions.  A negative result may occur with  improper specimen collection / handling, submission of specimen other  than nasopharyngeal swab, presence of viral mutation(s) within the  areas targeted by this assay, and inadequate number of viral copies  (<250 copies / mL). A negative result must be combined with clinical  observations, patient history, and epidemiological information. If result is POSITIVE SARS-CoV-2 target nucleic acids are DETECTED. The SARS-CoV-2 RNA is generally detectable in upper and lower  respiratory specimens dur ing the acute phase of infection.  Positive  results are indicative of active infection with SARS-CoV-2.  Clinical  correlation with patient history and other diagnostic information is  necessary to determine patient infection status.  Positive results do  not rule out bacterial infection or co-infection with other viruses. If result is PRESUMPTIVE  POSTIVE SARS-CoV-2 nucleic acids MAY BE PRESENT.   A presumptive positive result was obtained on the submitted specimen  and confirmed on repeat testing.  While 2019 novel coronavirus  (SARS-CoV-2) nucleic acids may be present in the submitted sample  additional confirmatory testing may be necessary for epidemiological  and / or clinical management purposes  to differentiate between  SARS-CoV-2 and other Sarbecovirus currently known to infect humans.  If clinically indicated additional testing with an alternate test  methodology [phone]) is advised. The SARS-CoV-2 RNA is generally  detectable in upper and lower respiratory sp ecimens during the acute  phase of  infection. The expected result is Negative. Fact Sheet for Patients:  StrictlyIdeas.no Fact Sheet for Healthcare Providers: BankingDealers.co.za This test is not yet approved or cleared by the [place] FDA and has been authorized for detection and/or diagnosis of SARS-CoV-2 by FDA under an Emergency Use Authorization (EUA).  This EUA will remain in effect (meaning this test can be used) for the duration of the COVID-19 declaration under Section 564(b)(1) of the Act, 21 U.S.C. section 360bbb-3(b)(1), unless the authorization is terminated or revoked sooner. Performed at [facility], [place] [address] [place], [place] [address]     Chemistries  Recent Labs  Lab 08/13/18 1355 08/27/2018 1633  NA 134* 123*  K 4.0 3.8  CL 105 97*  CO2 20 16*  GLUCOSE 99 120*  BUN 32* 24*  CREATININE 1.42* 1.35*  CALCIUM 9.1 7.6*  MG  --  1.4*  AST  --  17  ALT  --  16  ALKPHOS  --  56  BILITOT  --  0.5   ------------------------------------------------------------------------------------------------------------------  ------------------------------------------------------------------------------------------------------------------ GFR: Estimated Creatinine  Clearance: 27.2 mL/min (A) (by C-G formula based on SCr of 1.35 mg/dL (H)). Liver Function Tests: Recent Labs  Lab 08/21/2018 1633  AST 17  ALT 16  ALKPHOS 56  BILITOT 0.5  PROT 6.4*  ALBUMIN 3.0*   Recent Labs  Lab 09/03/2018 1633  LIPASE 30   No results for input(s): AMMONIA in the last 168 hours. Coagulation Profile: No results for input(s): INR, PROTIME in the last 168 hours. Cardiac Enzymes: No results for input(s): CKTOTAL, CKMB, CKMBINDEX, TROPONINI in the last 168 hours. BNP (last 3 results) No results for input(s): PROBNP in the last 8760 hours. HbA1C: No results for input(s): HGBA1C in the last 72 hours. CBG: No results for input(s): GLUCAP in the last 168 hours. Lipid Profile: No results for input(s): CHOL, HDL, LDLCALC, TRIG, CHOLHDL, LDLDIRECT in the last 72 hours. Thyroid Function Tests: No results for input(s): TSH, T4TOTAL, FREET4, T3FREE, THYROIDAB in the last 72 hours. Anemia Panel: No results for input(s): VITAMINB12, FOLATE, FERRITIN, TIBC, IRON, RETICCTPCT in the last 72 hours.  --------------------------------------------------------------------------------------------------------------- Urine analysis:    Component Value Date/Time   COLORURINE STRAW (A) 07/29/2018 1925   APPEARANCEUR HAZY (A) 07/29/2018 1925   LABSPEC 1.011 07/29/2018 1925   PHURINE 7.0 07/29/2018 1925   GLUCOSEU NEGATIVE 07/29/2018 1925   GLUCOSEU NEGATIVE 05/07/2017 1722   HGBUR SMALL (A) 07/29/2018 1925   BILIRUBINUR NEGATIVE 07/29/2018 1925   KETONESUR 5 (A) 07/29/2018 1925   PROTEINUR 30 (A) 07/29/2018 1925   UROBILINOGEN 0.2 05/07/2017 1722   NITRITE NEGATIVE 07/29/2018 1925   LEUKOCYTESUR LARGE (A) 07/29/2018 1925      Imaging Results:    Ct Abdomen Pelvis W Contrast  Result Date: 08/14/2018 CLINICAL DATA:  Pt BIB EMS from home. Pt speaks [place] only. Per pt family, pt is lethargic, increased weakness, diarrhea x 3 days. Per pt family, pt on antibiotics for various  infections. EXAM: CT ABDOMEN AND PELVIS WITH CONTRAST TECHNIQUE: Multidetector CT imaging of the abdomen and pelvis was performed using the standard protocol following bolus administration of intravenous contrast. CONTRAST:  130mL OMNIPAQUE IOHEXOL 300 MG/ML  SOLN COMPARISON:  CT of the abdomen and pelvis on 07/29/2018 FINDINGS: Lower chest: The heart is mildly enlarged. There is atherosclerotic calcification of the coronary vessels. Minimal atelectasis identified at the lung bases. Hepatobiliary: No focal liver abnormality is seen. No radiopaque gallstones, biliary dilatation, or pericholecystic inflammatory changes. Pancreas: Unremarkable. No  pancreatic ductal dilatation or surrounding inflammatory changes. Spleen: Normal in size without focal abnormality. Adrenals/Urinary Tract: The adrenal glands are normal in appearance. Small sub centimeter cysts are identified within the kidneys. No suspicious renal mass. No hydronephrosis. The ureters are unremarkable. Urinary bladder is unremarkable. Stomach/Bowel: Small hiatal hernia. Stomach is otherwise unremarkable. Small bowel loops are unremarkable. There is marked edema of the colon, throughout its course. There is pericolonic fluid. There is associated mesenteric edema in the findings are consistent with acute colitis. Vascular/Lymphatic: There is atherosclerotic calcification of the abdominal aorta. No associated aneurysm. No retroperitoneal or mesenteric adenopathy. Reproductive: The uterus is present. A pessary is in place. No adnexal mass. Other: There is a small amount of ascites, extending into and RIGHT inguinal hernia. Musculoskeletal: Moderate degenerative changes. There is anterolisthesis of L4 on L5. There is retrolisthesis of L1 on L2. IMPRESSION: 1. Marked edema of the colon throughout its course with pericolonic fluid and mesenteric edema. Findings are consistent with acute pancolitis, most likely infectious. 2. Small amount of ascites, extending into  and RIGHT inguinal hernia. 3. Small hiatal hernia. 4.  Aortic atherosclerosis.  (ICD10-I70.0) 5. Coronary artery disease. Electronically Signed   By: Nolon Nations M.D.   On: 08/08/2018 19:21       Assessment & Plan:    Principal Problem:   C. difficile colitis Active Problems:   Hypothyroidism due to acquired atrophy of thyroid   HYPERCHOLESTEROLEMIA   HTN (hypertension)   Dehydration  C. Diff colitis tx with vanco 125mg  po qid x 10 days Hydrate with D5w + bicarb iv  Hyponatremia likely secondary to diarrhea Hydrate with D5W + bicarb Check cmp in am Consider further w/up with serum osm, tsh, cortisol, urine sodium, urine osm   Hypothyroidism Cont levothyroxine 112 micrograms po qday  Hypertension Cont Lisinopril 10mg  po qday  Anxiety Cont Lexapro 5mg  po qday  Severe protein calorie malnutrition prostat 30 mL po bid   DVT Prophylaxis-   Lovenox - SCDs    AM Labs Ordered, also please review Full Orders  Family Communication: Admission, patients condition and plan of care including tests being ordered have been discussed with the patient and daughter who indicate understanding and agree with the plan and Code Status.  Code Status:  FULL CODE,   Admission status:  Inpatient: Based on patients clinical presentation and evaluation of above clinical data, I have made determination that patient meets Inpatient criteria at this time.  Pt is at high risk for clinical deterioration.  Pt is allerghic to many abx, and will need to be admitted for vanco.  Pt will also require iv hydration due to low Hco3, as well as hyponatremia. This will likely take >2 nites stay to correct.   Time spent in minutes : 70   Jani Gravel M.D on 08/16/2018 at 2:50 AM

## 2018-08-16 NOTE — Progress Notes (Signed)
PT Cancellation Note  Patient Details Name: Anita Banks MRN: [id number] DOB: 1927/03/25   Cancelled Treatment:    Reason Eval/Treat Not Completed: Medical issues which prohibited therapy(pt's daughter in room translating, she stated pt declined mobility at present due to having a fever and feeling too weak. Will follow.)   Philomena Doheny PT 08/16/2018  Acute Rehabilitation Services Pager [phone] Office [phone]

## 2018-08-16 NOTE — Telephone Encounter (Signed)
Pt went to ED

## 2018-08-16 NOTE — Progress Notes (Signed)
Pt complains of n/v via interpreter. Pt offered antiemetic but refuses. Pt asked if anything else was needed, but denies interventions at this time. Will reassess and monitor.

## 2018-08-16 NOTE — Consult Note (Signed)
[facility] CONSULT Reason for consult: C. difficile colitis Referring Physician: [facility] hospitalist.  PCP: Dr. Alain Marion.  Primary GI: Dr. Deatra Ina 15 years ago most recently Dr. Earle Gell 2011  Anita Banks is an 85 y.o. female.  HPI: Much of the history is obtained from the patient's daughter since the patient does not speak [place].  The patient was seen in the emergency room mid June and was found to have diverticulitis.  CT scan showed thickening in the sigmoid colon.  She also had a UTI.  She was treated with oral Flagyl and Omnicef..  She saw Dr. Alain Marion for a yeast infection.  She did well after taking these medications for about 10 days or so and for the past 2 days the daughter notes that she has been feeling sicker and having progressive diarrhea.  She had a WBC of 22 that has risen to 30 is slightly febrile.  CT scan showed pancolitis.  The patient has had several doses of vancomycin 125 mg.  She has a low-grade temperature.  Her daughter notes that she is quite sleepy but she has been taking clear liquids without problems.  Past Medical History:  Diagnosis Date  . Anxiety   . Arthritis   . Hypertension   . Migraine     Past Surgical History:  Procedure Laterality Date  . ABDOMINAL HYSTERECTOMY    . hemhorroid  2003    History reviewed. No pertinent family history.  Social History:  reports that she has never smoked. She has never used smokeless tobacco. She reports that she does not use drugs. No history on file for alcohol.  Allergies:  Allergies  Allergen Reactions  . Amitriptyline Hcl   . Azithromycin Hives    Per pt report - It is unclear if a little rash on the leg was related to Z pac or not.  . Doxycycline     nausea  . Erythromycin   . Metronidazole   . Penicillins     Can take keflex ok  . Rizatriptan Benzoate   . Tetracyclines & Related     Medications; Prior to Admission medications   Medication Sig Start Date End Date Taking?  Authorizing Provider  aspirin 81 MG tablet Take 81 mg by mouth daily.   Yes [provider]  clotrimazole-betamethasone (LOTRISONE) cream Apply 1 application topically 2 (two) times daily. 08/13/18 08/13/19 Yes Plotnikov, Evie Lacks, MD  estradiol (ESTRACE) 0.1 MG/GM vaginal cream Place 2 g vaginally See admin instructions. Every 2 days   Yes [provider]  Fremanezumab-vfrm (AJOVY) 225 MG/1.5ML SOSY Inject 225 mg into the skin every 30 (thirty) days.   Yes [provider]  glucosamine-chondroitin 500-400 MG tablet Take 1 tablet by mouth 2 (two) times daily. Patient taking differently: Take 1 tablet by mouth daily.  01/09/13  Yes Robyn Haber, MD  levothyroxine (SYNTHROID, LEVOTHROID) 112 MCG tablet Take 1 tablet (112 mcg total) by mouth daily. 04/22/18  Yes Plotnikov, Evie Lacks, MD  lisinopril (PRINIVIL,ZESTRIL) 10 MG tablet TAKE 1 TABLET BY MOUTH IN THE MORNING AND 2 IN THE EVENING Patient taking differently: Take 10-20 mg by mouth See admin instructions. Take 20mg  by mouth in the morning and 10mg  by mouth in the evening 03/25/18  Yes Plotnikov, Evie Lacks, MD  LORazepam (ATIVAN) 1 MG tablet TAKE 1 TABLET AT BEDTIME MAY REPEAT 1 TIME IF NEEDED Patient taking differently: Take 1 mg by mouth at bedtime as needed for sleep.  04/22/18  Yes Plotnikov, Tyrone Apple  V, MD  Omega-3 Fatty Acids (FISH OIL) 1200 MG CAPS Take 1 capsule (1,200 mg total) by mouth daily. 01/09/13  Yes Robyn Haber, MD  polyethylene glycol powder (GLYCOLAX/MIRALAX) powder MIX 1 SCOOP IN LIQUID AND TAKE ONCE DAILY. 10/10/16  Yes Plotnikov, Evie Lacks, MD  propranolol (INDERAL) 10 MG tablet Take 1 tablet (10 mg total) by mouth every morning. 04/22/18  Yes Plotnikov, Evie Lacks, MD  propranolol (INDERAL) 20 MG tablet Take 1 tablet (20 mg total) by mouth 2 (two) times daily. 04/22/18  Yes Plotnikov, Evie Lacks, MD  propranolol (INDERAL) 60 MG tablet Take 60 mg by mouth daily.   Yes [provider]  propranolol  ER (INDERAL LA) 60 MG 24 hr capsule Take 1 each by mouth daily. 07/14/18  Yes [provider]  topiramate (TOPAMAX) 50 MG tablet Take 50 mg by mouth daily.   Yes [provider]  Coenzyme Q10 (CO Q-10) 100 MG CAPS Take 1 tablet by mouth daily. Patient not taking: Reported on 08/19/2018 01/09/13   Robyn Haber, MD  diclofenac sodium (VOLTAREN) 1 % GEL Apply topically to affected area qid Patient not taking: Reported on 09/04/2018 06/27/17   Gerda Diss, DO  escitalopram (LEXAPRO) 5 MG tablet Take 1 tablet (5 mg total) by mouth daily. Patient not taking: Reported on 08/14/2018 04/22/18   Plotnikov, Evie Lacks, MD  metroNIDAZOLE (FLAGYL) 500 MG tablet Take 1 tablet (500 mg total) by mouth 2 (two) times daily. Patient not taking: Reported on 08/14/2018 07/30/18   Montine Circle, PA-C  mirtazapine (REMERON) 15 MG tablet Take 1 tablet (15 mg total) by mouth at bedtime. Patient not taking: Reported on 08/29/2018 04/22/18   Plotnikov, Evie Lacks, MD  Multiple Vitamins-Minerals (CENTRUM SILVER ADULT 50+) TABS Take 1 tablet by mouth daily. Patient not taking: Reported on 08/21/2018 01/09/13   Robyn Haber, MD  ondansetron (ZOFRAN ODT) 4 MG disintegrating tablet Take 1 tablet (4 mg total) by mouth every 8 (eight) hours as needed for nausea or vomiting. Patient not taking: Reported on 08/30/2018 07/30/18   Montine Circle, PA-C  triamcinolone cream (KENALOG) 0.1 % Apply 1 application topically 2 (two) times daily. Patient not taking: Reported on 08/09/2018 03/08/17   Plotnikov, Evie Lacks, MD   . enoxaparin (LOVENOX) injection  40 mg Subcutaneous Daily  . [START ON 08/17/2018] estradiol  2 g Vaginal Q48H  . feeding supplement (PRO-STAT SUGAR FREE 64)  30 mL Oral BID  . [START ON 08/17/2018] levothyroxine  112 mcg Oral Daily  . propranolol ER  60 mg Oral Daily  . topiramate  50 mg Oral Daily  . vancomycin  500 mg Oral QID   PRN Meds acetaminophen **OR** acetaminophen, LORazepam, ondansetron  (ZOFRAN) IV Results for orders placed or performed during the hospital encounter of 08/31/2018 (from the past 48 hour(s))  CBC with Differential     Status: Abnormal   Collection Time: 08/27/2018  4:33 PM  Result Value Ref Range   WBC 21.7 (H) 4.0 - 10.5 K/uL   RBC 4.53 3.87 - 5.11 MIL/uL   Hemoglobin 13.3 12.0 - 15.0 g/dL   HCT 40.9 36.0 - 46.0 %   MCV 90.3 80.0 - 100.0 fL   MCH 29.4 26.0 - 34.0 pg   MCHC 32.5 30.0 - 36.0 g/dL   RDW 12.7 11.5 - 15.5 %   Platelets 365 150 - 400 K/uL   nRBC 0.0 0.0 - 0.2 %   Neutrophils Relative % 87 %   Neutro Abs  18.8 (H) 1.7 - 7.7 K/uL   Lymphocytes Relative 4 %   Lymphs Abs 0.8 0.7 - 4.0 K/uL   Monocytes Relative 8 %   Monocytes Absolute 1.8 (H) 0.1 - 1.0 K/uL   Eosinophils Relative 0 %   Eosinophils Absolute 0.0 0.0 - 0.5 K/uL   Basophils Relative 0 %   Basophils Absolute 0.1 0.0 - 0.1 K/uL   Immature Granulocytes 1 %   Abs Immature Granulocytes 0.24 (H) 0.00 - 0.07 K/uL    Comment: Performed at [facility], [place] [address] [place], [place] [address]  Comprehensive metabolic panel     Status: Abnormal   Collection Time: 08/10/2018  4:33 PM  Result Value Ref Range   Sodium 123 (L) 135 - 145 mmol/L   Potassium 3.8 3.5 - 5.1 mmol/L   Chloride 97 (L) 98 - 111 mmol/L   CO2 16 (L) 22 - 32 mmol/L   Glucose, Bld 120 (H) 70 - 99 mg/dL   BUN 24 (H) 8 - 23 mg/dL   Creatinine, Ser 1.35 (H) 0.44 - 1.00 mg/dL   Calcium 7.6 (L) 8.9 - 10.3 mg/dL   Total Protein 6.4 (L) 6.5 - 8.1 g/dL   Albumin 3.0 (L) 3.5 - 5.0 g/dL   AST 17 15 - 41 U/L   ALT 16 0 - 44 U/L   Alkaline Phosphatase 56 38 - 126 U/L   Total Bilirubin 0.5 0.3 - 1.2 mg/dL   GFR calc non Af Amer 35 (L) >60 mL/min   GFR calc Af Amer 40 (L) >60 mL/min   Anion gap 10 5 - 15    Comment: Performed at [facility], [place] [address], [place], [place] [address]  Lipase, blood     Status: None   Collection Time: 08/09/2018  4:33 PM  Result Value Ref Range   Lipase  30 11 - 51 U/L    Comment: Performed at [facility], [place] [address] [place], [place] [address]  Magnesium     Status: Abnormal   Collection Time: 08/11/2018  4:33 PM  Result Value Ref Range   Magnesium 1.4 (L) 1.7 - 2.4 mg/dL    Comment: Performed at [facility], [place] [address] [place], [place] [address]  Gastrointestinal Panel by PCR , Stool     Status: None   Collection Time: 08/14/2018  8:31 PM   Specimen: Stool  Result Value Ref Range   Campylobacter species NOT DETECTED NOT DETECTED   Plesimonas shigelloides NOT DETECTED NOT DETECTED   Salmonella species NOT DETECTED NOT DETECTED   Yersinia enterocolitica NOT DETECTED NOT DETECTED   Vibrio species NOT DETECTED NOT DETECTED   Vibrio cholerae NOT DETECTED NOT DETECTED   Enteroaggregative E coli (EAEC) NOT DETECTED NOT DETECTED   Enteropathogenic E coli (EPEC) NOT DETECTED NOT DETECTED   Enterotoxigenic E coli (ETEC) NOT DETECTED NOT DETECTED   Shiga like toxin producing E coli (STEC) NOT DETECTED NOT DETECTED   Shigella/Enteroinvasive E coli (EIEC) NOT DETECTED NOT DETECTED   Cryptosporidium NOT DETECTED NOT DETECTED   Cyclospora cayetanensis NOT DETECTED NOT DETECTED   Entamoeba histolytica NOT DETECTED NOT DETECTED   Giardia lamblia NOT DETECTED NOT DETECTED   Adenovirus F40/41 NOT DETECTED NOT DETECTED   Astrovirus NOT DETECTED NOT DETECTED   Norovirus GI/GII NOT DETECTED NOT DETECTED   Rotavirus A NOT DETECTED NOT DETECTED   Sapovirus (I, II, IV, and V) NOT DETECTED NOT DETECTED    Comment:  Performed at [facility], [place], [place] [address]  C Difficile Quick Screen w PCR reflex     Status: Abnormal   Collection Time: 08/20/2018  8:31 PM   Specimen: Stool  Result Value Ref Range   C Diff antigen POSITIVE (A) NEGATIVE   C Diff toxin POSITIVE (A) NEGATIVE   C Diff interpretation Toxin producing C. difficile detected.     Comment: CRITICAL RESULT CALLED TO,  READ BACK BY AND VERIFIED WITHAlfonso Patten GROVES RN 2124 08/08/2018 A NAVARRO Performed at [facility], [place] [address] [place], [place] [address]   SARS Coronavirus 2 (CEPHEID - Performed in [place] hospital lab), Hosp Order     Status: None   Collection Time: 08/20/2018  9:23 PM   Specimen: Nasopharyngeal Swab  Result Value Ref Range   SARS Coronavirus 2 NEGATIVE NEGATIVE    Comment: (NOTE) If result is NEGATIVE SARS-CoV-2 target nucleic acids are NOT DETECTED. The SARS-CoV-2 RNA is generally detectable in upper and lower  respiratory specimens during the acute phase of infection. The lowest  concentration of SARS-CoV-2 viral copies this assay can detect is 250  copies / mL. A negative result does not preclude SARS-CoV-2 infection  and should not be used as the sole basis for treatment or other  patient management decisions.  A negative result may occur with  improper specimen collection / handling, submission of specimen other  than nasopharyngeal swab, presence of viral mutation(s) within the  areas targeted by this assay, and inadequate number of viral copies  (<250 copies / mL). A negative result must be combined with clinical  observations, patient history, and epidemiological information. If result is POSITIVE SARS-CoV-2 target nucleic acids are DETECTED. The SARS-CoV-2 RNA is generally detectable in upper and lower  respiratory specimens dur ing the acute phase of infection.  Positive  results are indicative of active infection with SARS-CoV-2.  Clinical  correlation with patient history and other diagnostic information is  necessary to determine patient infection status.  Positive results do  not rule out bacterial infection or co-infection with other viruses. If result is PRESUMPTIVE POSTIVE SARS-CoV-2 nucleic acids MAY BE PRESENT.   A presumptive positive result was obtained on the submitted specimen  and confirmed on repeat testing.  While 2019 novel  coronavirus  (SARS-CoV-2) nucleic acids may be present in the submitted sample  additional confirmatory testing may be necessary for epidemiological  and / or clinical management purposes  to differentiate between  SARS-CoV-2 and other Sarbecovirus currently known to infect humans.  If clinically indicated additional testing with an alternate test  methodology [phone]) is advised. The SARS-CoV-2 RNA is generally  detectable in upper and lower respiratory sp ecimens during the acute  phase of infection. The expected result is Negative. Fact Sheet for Patients:  StrictlyIdeas.no Fact Sheet for Healthcare Providers: BankingDealers.co.za This test is not yet approved or cleared by the [place] FDA and has been authorized for detection and/or diagnosis of SARS-CoV-2 by FDA under an Emergency Use Authorization (EUA).  This EUA will remain in effect (meaning this test can be used) for the duration of the COVID-19 declaration under Section 564(b)(1) of the Act, 21 U.S.C. section 360bbb-3(b)(1), unless the authorization is terminated or revoked sooner. Performed at [facility], [place] [address] [place],  [address]   CBC     Status: Abnormal   Collection Time: 08/16/18  5:35 AM  Result Value Ref Range   WBC  30.2 (H) 4.0 - 10.5 K/uL   RBC 4.72 3.87 - 5.11 MIL/uL   Hemoglobin 13.8 12.0 - 15.0 g/dL   HCT 41.8 36.0 - 46.0 %   MCV 88.6 80.0 - 100.0 fL   MCH 29.2 26.0 - 34.0 pg   MCHC 33.0 30.0 - 36.0 g/dL   RDW 12.8 11.5 - 15.5 %   Platelets 306 150 - 400 K/uL   nRBC 0.0 0.0 - 0.2 %    Comment: Performed at [facility], [place] [address], [place], [place] [address]  Glucose, capillary     Status: Abnormal   Collection Time: 08/16/18  7:30 AM  Result Value Ref Range   Glucose-Capillary 117 (H) 70 - 99 mg/dL  Comprehensive metabolic panel     Status: Abnormal   Collection Time: 08/16/18  7:59 AM   Result Value Ref Range   Sodium 128 (L) 135 - 145 mmol/L   Potassium 3.8 3.5 - 5.1 mmol/L   Chloride 100 98 - 111 mmol/L   CO2 15 (L) 22 - 32 mmol/L   Glucose, Bld 123 (H) 70 - 99 mg/dL   BUN 17 8 - 23 mg/dL   Creatinine, Ser 1.15 (H) 0.44 - 1.00 mg/dL   Calcium 7.7 (L) 8.9 - 10.3 mg/dL   Total Protein 6.2 (L) 6.5 - 8.1 g/dL   Albumin 2.8 (L) 3.5 - 5.0 g/dL   AST 19 15 - 41 U/L   ALT 16 0 - 44 U/L   Alkaline Phosphatase 62 38 - 126 U/L   Total Bilirubin 0.5 0.3 - 1.2 mg/dL   GFR calc non Af Amer 42 (L) >60 mL/min   GFR calc Af Amer 49 (L) >60 mL/min   Anion gap 13 5 - 15    Comment: Performed at [facility], [place] [address] [place],  [address]    Ct Abdomen Pelvis W Contrast  Result Date: 08/26/2018 CLINICAL DATA:  Pt BIB EMS from home. Pt speaks [place] only. Per pt family, pt is lethargic, increased weakness, diarrhea x 3 days. Per pt family, pt on antibiotics for various infections. EXAM: CT ABDOMEN AND PELVIS WITH CONTRAST TECHNIQUE: Multidetector CT imaging of the abdomen and pelvis was performed using the standard protocol following bolus administration of intravenous contrast. CONTRAST:  13mL OMNIPAQUE IOHEXOL 300 MG/ML  SOLN COMPARISON:  CT of the abdomen and pelvis on 07/29/2018 FINDINGS: Lower chest: The heart is mildly enlarged. There is atherosclerotic calcification of the coronary vessels. Minimal atelectasis identified at the lung bases. Hepatobiliary: No focal liver abnormality is seen. No radiopaque gallstones, biliary dilatation, or pericholecystic inflammatory changes. Pancreas: Unremarkable. No pancreatic ductal dilatation or surrounding inflammatory changes. Spleen: Normal in size without focal abnormality. Adrenals/Urinary Tract: The adrenal glands are normal in appearance. Small sub centimeter cysts are identified within the kidneys. No suspicious renal mass. No hydronephrosis. The ureters are unremarkable. Urinary bladder is unremarkable.  Stomach/Bowel: Small hiatal hernia. Stomach is otherwise unremarkable. Small bowel loops are unremarkable. There is marked edema of the colon, throughout its course. There is pericolonic fluid. There is associated mesenteric edema in the findings are consistent with acute colitis. Vascular/Lymphatic: There is atherosclerotic calcification of the abdominal aorta. No associated aneurysm. No retroperitoneal or mesenteric adenopathy. Reproductive: The uterus is present. A pessary is in place. No adnexal mass. Other: There is a small amount of ascites, extending into and RIGHT inguinal hernia. Musculoskeletal: Moderate degenerative changes. There is anterolisthesis of L4 on L5. There is retrolisthesis of  L1 on L2. IMPRESSION: 1. Marked edema of the colon throughout its course with pericolonic fluid and mesenteric edema. Findings are consistent with acute pancolitis, most likely infectious. 2. Small amount of ascites, extending into and RIGHT inguinal hernia. 3. Small hiatal hernia. 4.  Aortic atherosclerosis.  (ICD10-I70.0) 5. Coronary artery disease. Electronically Signed   By: Nolon Nations M.D.   On: 08/10/2018 19:21               Blood pressure 137/67, pulse 89, temperature 98.6 F (37 C), temperature source Axillary, resp. rate 14, height 5\' 3"  (1.6 m), weight 74.5 kg, SpO2 98 %.  Physical exam:   General--Pleasant female who is somewhat sleepy.  She speaks only [place] and her daughter is serving as an Astronomer. ENT--extraocular movements intact nonicteric Neck--full range of motion, supple Heart--regular rate and rhythm without murmurs or gallops Lungs--clear Abdomen--nondistended with apparent very mild tenderness few bowel sounds are present. Psych--unable to assess due to language issues   Assessment: 1.  C. difficile colitis.  CT scan shows pancolitis.  C. difficile toxin is positive.  The patient has had low-dose vancomycin for only a couple doses and in view of her elevated  white count and pancolitis I have recommended that the dosage be increased to 500 mg every 6 hours.  We have also suggested keeping her on IV Flagyl.  Fortunately, she is taking p.o. liquids reasonably well now and if this continues this regimen may be adequate.  If not we may be required to place a enteral tube or give vancomycin enemas.  Dificid is a possibility but would also require p.o. intake.  Plan: Would continue as you are with vancomycin 500 mg every 6 hours and IV Flagyl.  I would ask ID to see in case more aggressive therapy as needed.  We could consider enteral tube or vancomycin enemas.  [facility] will continue to follow.   Nancy Fetter 08/16/2018, 4:31 PM   This note was created using voice recognition software and minor errors may Have occurred unintentionally. Pager: [phone] If no answer or after hours call [phone]

## 2018-08-16 NOTE — TOC Initial Note (Signed)
Transition of Care [facility]) - Initial/Assessment Note    Patient Details  Name: Anita Banks MRN: [id number] Date of Birth: [birth year]  Transition of Care [facility]) CM/SW Contact:    [name], RN Phone Number: 08/16/2018, 2:07 PM  Clinical Narrative:Speaks Russian-dtr in rm Natashw to translate-d/c plans-Return back to [employer] w/adl's, has private duty aide. dtr agree to [facility] if recc. PT cons-await recc.                         Home Health Agency InformationSorted ascending, Select to sort descending  Quality of Patient Care RatingSelect to sort ascending or descending Quality of Patient Care Rating Contextual Help  Patient Survey Summary RatingSelect to sort ascending or descending QPatient Survey Summary Rating Contextual Help  [facility] [phone]  [place] my Favorites Quality of Patient Care Rating 4 out of 5 stars Patient Survey Summary Rating 4 out of [place] [phone]  [place] my Favorites Quality of Patient Care Rating 3 out of 5 stars Patient Survey Summary Rating 4 out of 5 stars [place] [phone]  Add [facility] my Favorites Quality of Patient Care Rating 4  out of 5 stars Patient Survey Summary Rating 3 out of 5 stars [place] [phone]  [place], [facility] my Favorites Quality of Patient Care Rating 4  out of 5 stars Patient Survey Summary Rating 4 out of 5 stars [place] [phone]  [place], [employer] my Favorites Quality of Patient Care Rating 4 out of 5 stars Patient Survey Summary Rating 4 out of 5 stars [place] [phone]  [place] my Favorites Quality of Patient Care Rating 4 out of 5 stars Patient Survey Summary Rating 4 out of 5 stars [facility] [place] [phone]  [place] my Favorites Quality of Patient Care Rating 3  out of 5 stars Patient Survey Summary Rating 4 out of 5 stars [place] [phone]  [place] my Favorites Quality of Patient Care Rating 2  out of 5 stars Patient Survey Summary Rating 3 out of 5 stars [place] [phone]  [place] my Favorites Quality of Patient Care Rating 3 out of 5 stars Patient Survey Summary Rating 4 out of 5 stars [facility] [phone]) [phone]  Add [facility] my Favorites Quality of Patient Care Rating 4 out of 5 stars Not [place] [phone]  [place] my Favorites Quality of Patient Care Rating 3 out of 5 stars Patient Survey Summary Rating 4 out of 5 stars [facility] [place] ([phone]) [phone]  Add [facility] [place] my Favorites Not Available5 Not Available12 [facility] [phone]  Add [facility]avor[facility] Quality of Patient Care Rating 3  out of 5 stars Patient Survey Summary Rating 3 out of 5 stars [place] [phone]  [place] my Favorites Quality of Patient Care Rating 5 out of 5 stars Patient Survey Summary Rating 4 out of 5 stars [place] [phone]  Add [facility] Quality of Patient Care Rating  3  out of 5 stars Patient Survey Summary Rating 4 out of 5 stars [place] [phone]  Add [facility] Quality of Patient Care Rating 3  out of 5 stars Patient Survey Summary Rating 3 out of 5 stars [place] [phone]  Add [facility] my Favor[facility] Quality of Patient Care Rating 3  out of 5  stars Not [place] [phone]   Expected Discharge Plan: Home/Self Care Barriers to Discharge: Continued Medical Work up   Patient Goals and CMS Choice Patient states their goals for this hospitalization and ongoing recovery are:: go home CMS Medicare.gov Compare Post Acute Care list provided to:: Patient Represenative (must comment) Choice offered to / list presented to : Adult Children  Expected Discharge Plan and Services Expected Discharge Plan: Home/Self Care   Discharge Planning Services: CM Consult   Living arrangements for the past 2 months: [place]                                      Prior Living Arrangements/Services Living arrangements for the past 2 months: [place] Lives with:: Self Patient language and need for interpreter reviewed:: Yes(Speaks Russian-Natasha-dtr in rm to translate)        Need for Family Participation in Patient Care: No (Comment) Care giver support system in place?: Yes (comment) Current home services: Other (comment)(custodial leve private duty aide) Criminal Activity/Legal Involvement Pertinent to Current Situation/Hospitalization: No - Comment as needed  Activities of Daily Living Home Assistive Devices/Equipment: Blood pressure cuff ADL Screening (condition at time of admission) Patient's cognitive ability adequate to safely complete daily activities?: Yes Is the patient deaf or have difficulty hearing?: No Does the patient have difficulty seeing, even when wearing glasses/contacts?: No Does the patient have difficulty concentrating, remembering, or making decisions?: No Patient able to express need for assistance with ADLs?: Yes Does the patient have difficulty dressing or bathing?: Yes Independently performs ADLs?: No Communication: Independent Dressing (OT): Independent Grooming: Needs assistance Is this a change from baseline?: Pre-admission  baseline Feeding: Independent Is this a change from baseline?: Pre-admission baseline Bathing: Needs assistance Is this a change from baseline?: Pre-admission baseline Toileting: Needs assistance Is this a change from baseline?: Pre-admission baseline In/Out Bed: Independent Walks in Home: Independent Does the patient have difficulty walking or climbing stairs?: No Weakness of Legs: Both Weakness of Arms/Hands: Both  Permission Sought/Granted Permission sought to share information with : Case Manager Permission granted to share information with : Yes, Verbal Permission Granted  Share Information with NAME: [name]     Permission granted to share info w Relationship: dtr  Permission granted to share info w Contact Information: [phone]  Emotional Assessment Appearance:: Appears stated age Attitude/Demeanor/Rapport: Gracious Affect (typically observed): Accepting Orientation: : Oriented to Self, Oriented to Place, Oriented to  Time, Oriented to Situation Alcohol / Substance Use: Never Used Psych Involvement: No (comment)  Admission diagnosis:  Dehydration [E86.0] C. difficile colitis [A04.72] Patient Active Problem List   Diagnosis Date Noted  . Dehydration 08/16/2018  . C. difficile colitis 08/17/2018  . Diarrhea 08/13/2018  . Intertrigo 08/13/2018  . HTN (hypertension) 08/13/2018  . Vitamin D deficiency 12/20/2017  . Drug reaction 08/07/2017  . Lower extremity edema 08/29/2016  . Cough 01/12/2016  . Wheezing 01/12/2016  . Occipital headache  12/24/2015  . Chest pain, atypical 09/13/2015  . Acute upper respiratory infection 04/29/2015  . Pruritic condition 01/18/2015  . Actinic keratoses 09/13/2014  . Gait disorder 09/04/2014  . Rash and nonspecific skin eruption 09/04/2014  . Polymyalgia rheumatica ([place]) 04/20/2011  . INSOMNIA, PERSISTENT 06/26/2007  . HYPERCHOLESTEROLEMIA 02/20/2007  . Depression with anxiety 02/20/2007  . Hypothyroidism due to acquired  atrophy of thyroid 12/03/2006  . Osteoarthritis of left knee 12/03/2006  . OSTEOPENIA 12/03/2006  . Migraine variant 11/26/2006  . Irritable bowel syndrome 11/26/2006   PCP:  [name], MD Pharmacy:   [id number] [place], [place] [address] [place] [address] Phone: [phone] Fax: [phone]     Social Determinants of Health (SDOH) Interventions    Readmission Risk Interventions No flowsheet data found.

## 2018-08-17 ENCOUNTER — Inpatient Hospital Stay (HOSPITAL_COMMUNITY): Payer: Medicare Other

## 2018-08-17 DIAGNOSIS — R627 Adult failure to thrive: Secondary | ICD-10-CM | POA: Diagnosis present

## 2018-08-17 LAB — BASIC METABOLIC PANEL
Anion gap: 10 (ref 5–15)
BUN: 17 mg/dL (ref 8–23)
CO2: 16 mmol/L — ABNORMAL LOW (ref 22–32)
Calcium: 7.2 mg/dL — ABNORMAL LOW (ref 8.9–10.3)
Chloride: 101 mmol/L (ref 98–111)
Creatinine, Ser: 1.32 mg/dL — ABNORMAL HIGH (ref 0.44–1.00)
GFR calc Af Amer: 41 mL/min — ABNORMAL LOW (ref >60)
GFR calc non Af Amer: 36 mL/min — ABNORMAL LOW (ref >60)
Glucose, Bld: 135 mg/dL — ABNORMAL HIGH (ref 70–99)
Potassium: 3.4 mmol/L — ABNORMAL LOW (ref 3.5–5.1)
Sodium: 127 mmol/L — ABNORMAL LOW (ref 135–145)

## 2018-08-17 LAB — CBC WITH DIFFERENTIAL/PLATELET
Abs Immature Granulocytes: 0.35 10*3/uL — ABNORMAL HIGH (ref 0.00–0.07)
Basophils Absolute: 0.1 10*3/uL (ref 0.0–0.1)
Basophils Relative: 1 %
Eosinophils Absolute: 0.1 10*3/uL (ref 0.0–0.5)
Eosinophils Relative: 0 %
HCT: 38.4 % (ref 36.0–46.0)
Hemoglobin: 12.1 g/dL (ref 12.0–15.0)
Immature Granulocytes: 2 %
Lymphocytes Relative: 5 %
Lymphs Abs: 1.1 10*3/uL (ref 0.7–4.0)
MCH: 29.4 pg (ref 26.0–34.0)
MCHC: 31.5 g/dL (ref 30.0–36.0)
MCV: 93.2 fL (ref 80.0–100.0)
Monocytes Absolute: 2.2 10*3/uL — ABNORMAL HIGH (ref 0.1–1.0)
Monocytes Relative: 10 %
Neutro Abs: 18.6 10*3/uL — ABNORMAL HIGH (ref 1.7–7.7)
Neutrophils Relative %: 82 %
Platelets: 289 10*3/uL (ref 150–400)
RBC: 4.12 MIL/uL (ref 3.87–5.11)
RDW: 13.1 % (ref 11.5–15.5)
WBC: 22.4 10*3/uL — ABNORMAL HIGH (ref 4.0–10.5)
nRBC: 0 % (ref 0.0–0.2)

## 2018-08-17 LAB — MAGNESIUM: Magnesium: 1.8 mg/dL (ref 1.7–2.4)

## 2018-08-17 LAB — LACTIC ACID, PLASMA: Lactic Acid, Venous: 1.9 mmol/L (ref 0.5–1.9)

## 2018-08-17 MED ORDER — SODIUM CHLORIDE 0.9 % IV BOLUS
1000.0000 mL | Freq: Once | INTRAVENOUS | Status: AC
  Administered 2018-08-17: 1000 mL via INTRAVENOUS

## 2018-08-17 MED ORDER — LACTATED RINGERS IV SOLN
INTRAVENOUS | Status: AC
  Administered 2018-08-17: 15:00:00 via INTRAVENOUS

## 2018-08-17 MED ORDER — IPRATROPIUM-ALBUTEROL 0.5-2.5 (3) MG/3ML IN SOLN: 3.0000 mL | Freq: Four times a day (QID) | RESPIRATORY_TRACT | Status: DC | PRN

## 2018-08-17 MED ORDER — VANCOMYCIN 50 MG/ML ORAL SOLUTION
125.0000 mg | Freq: Four times a day (QID) | ORAL | Status: DC
  Administered 2018-08-17 – 2018-08-18 (×3): 125 mg via ORAL
  Filled 2018-08-17 (×5): qty 2.5

## 2018-08-17 MED ORDER — TOPIRAMATE 25 MG PO TABS
50.0000 mg | ORAL_TABLET | Freq: Every day | ORAL | Status: DC
  Administered 2018-08-18: 50 mg via ORAL
  Filled 2018-08-17: qty 2

## 2018-08-17 MED ORDER — ZINC OXIDE 40 % EX OINT
TOPICAL_OINTMENT | CUTANEOUS | Status: DC | PRN
  Filled 2018-08-17 (×2): qty 57

## 2018-08-17 MED ORDER — SODIUM BICARBONATE 650 MG PO TABS
650.0000 mg | ORAL_TABLET | Freq: Three times a day (TID) | ORAL | Status: DC
  Administered 2018-08-17 – 2018-08-19 (×7): 650 mg via ORAL
  Filled 2018-08-17 (×7): qty 1

## 2018-08-17 MED ORDER — ENOXAPARIN SODIUM 30 MG/0.3ML ~~LOC~~ SOLN
30.0000 mg | Freq: Every day | SUBCUTANEOUS | Status: DC
  Administered 2018-08-18 – 2018-08-19 (×2): 30 mg via SUBCUTANEOUS
  Filled 2018-08-17 (×2): qty 0.3

## 2018-08-17 NOTE — Progress Notes (Signed)
[facility] Progress Note  Subjective: Patient seen in follow-up today for C. difficile colitis.  Daughter is present and translating.  She thinks she is doing better.  Less abdominal pain.  Less stools.  Objective: Vital signs in last 24 hours: Temp:  [97.7 F (36.5 C)-100.9 F (38.3 C)] 97.9 F (36.6 C) (07/11 1250) Pulse Rate:  [74-89] 74 (07/11 1250) Resp:  [14-20] 20 (07/11 1250) BP: (120-137)/(55-77) 128/77 (07/11 1250) SpO2:  [98 %-100 %] 100 % (07/11 1250) Weight:  [75.7 kg] 75.7 kg (07/11 0422) Weight change: 2.1 kg   PE:  No distress  Abdomen soft nontender  Lab Results: Results for orders placed or performed during the hospital encounter of 08/28/2018 (from the past 24 hour(s))  CBC with Differential/Platelet     Status: Abnormal   Collection Time: 08/17/18  6:02 AM  Result Value Ref Range   WBC 22.4 (H) 4.0 - 10.5 K/uL   RBC 4.12 3.87 - 5.11 MIL/uL   Hemoglobin 12.1 12.0 - 15.0 g/dL   HCT 38.4 36.0 - 46.0 %   MCV 93.2 80.0 - 100.0 fL   MCH 29.4 26.0 - 34.0 pg   MCHC 31.5 30.0 - 36.0 g/dL   RDW 13.1 11.5 - 15.5 %   Platelets 289 150 - 400 K/uL   nRBC 0.0 0.0 - 0.2 %   Neutrophils Relative % 82 %   Neutro Abs 18.6 (H) 1.7 - 7.7 K/uL   Lymphocytes Relative 5 %   Lymphs Abs 1.1 0.7 - 4.0 K/uL   Monocytes Relative 10 %   Monocytes Absolute 2.2 (H) 0.1 - 1.0 K/uL   Eosinophils Relative 0 %   Eosinophils Absolute 0.1 0.0 - 0.5 K/uL   Basophils Relative 1 %   Basophils Absolute 0.1 0.0 - 0.1 K/uL   WBC Morphology MILD LEFT SHIFT (1-5% METAS, OCC MYELO, OCC BANDS)    Immature Granulocytes 2 %   Abs Immature Granulocytes 0.35 (H) 0.00 - 0.07 K/uL  Basic metabolic panel     Status: Abnormal   Collection Time: 08/17/18  6:02 AM  Result Value Ref Range   Sodium 127 (L) 135 - 145 mmol/L   Potassium 3.4 (L) 3.5 - 5.1 mmol/L   Chloride 101 98 - 111 mmol/L   CO2 16 (L) 22 - 32 mmol/L   Glucose, Bld 135 (H) 70 - 99 mg/dL   BUN 17 8 - 23 mg/dL   Creatinine, Ser 1.32 (H) 0.44 - 1.00 mg/dL   Calcium 7.2 (L) 8.9 - 10.3 mg/dL   GFR calc non Af Amer 36 (L) >60 mL/min   GFR calc Af Amer 41 (L) >60 mL/min   Anion gap 10 5 - 15  Magnesium     Status: None   Collection Time: 08/17/18  6:02 AM  Result Value Ref Range   Magnesium 1.8 1.7 - 2.4 mg/dL  Lactic acid, plasma     Status: None   Collection Time: 08/17/18  6:02 AM  Result Value Ref Range   Lactic Acid, Venous 1.9 0.5 - 1.9 mmol/L    Studies/Results: Dg Chest Port 1 View  Result Date: 08/17/2018 CLINICAL DATA:  Wheezing EXAM: PORTABLE CHEST 1 VIEW COMPARISON:  08/07/2017 FINDINGS: Low volume AP portable examination with minimal bibasilar atelectasis or scarring. No acute appearing airspace opacity. The heart and mediastinum are normal in size. IMPRESSION: Low volume AP portable examination with minimal bibasilar atelectasis or scarring. No acute appearing airspace opacity. The heart and mediastinum are normal in  size. Electronically Signed   By: Eddie Candle M.D.   On: 08/17/2018 12:16      Assessment: C. difficile colitis    Plan:   Continue current medications and follow clinical course.    Cassell Clement 08/17/2018, 12:55 PM  Pager: [phone] If no answer or after 5 PM call [phone]

## 2018-08-17 NOTE — Progress Notes (Signed)
PT Cancellation Note  Patient Details Name: Anita Banks MRN: [id number] DOB: [birth year]   Cancelled Treatment:    Reason Eval/Treat Not Completed: Fatigue/lethargy limiting ability to participate. Daughter feeding patient when PT entered. Daughter translated. Daughter reported patient max assist to sit for physician to listen to lungs earlier this am. Patient declined PT eval today. PT reported would attempt eval tomorrow.    Floria Raveling. Hartnett-Rands, MS, PT Per [place] [phone] 08/17/2018, 10:07 AM

## 2018-08-17 NOTE — Progress Notes (Signed)
PROGRESS NOTE  Anita Banks KCL:[id number] DOB: 04-Nov-1927 DOA: 08/09/2018 PCP: [name], MD  HPI/Recap of past 24 hours:  Daughter at bedside translating She appear more awake, reports intermittent ab pain, feeling nauseous, no vomiting, able to keep meds down Continue to have diarrhea tmax 100.9 yesterday afternoon  RN reports heard patient wheezing this am, no hypoxia, cxr pending  Assessment/Plan: Principal Problem:   C. difficile colitis Active Problems:   Hypothyroidism due to acquired atrophy of thyroid   HYPERCHOLESTEROLEMIA   HTN (hypertension)   Dehydration   Hyponatremia   AKI (acute kidney injury) ([place])   CKD (chronic kidney disease), stage III ([place])  Cdiff colitis/spesis presented on admission, with fever, leukocytosis, tacypnea -she was recently on abx for diverticulitis which was diagnosed at [facility] ED on 6/22 -CT ab "1. Marked edema of the colon throughout its course with pericolonic fluid and mesenteric edema. Findings are consistent with acute pancolitis, most likely infectious.",  -gi input appreciated, patient is started on vanc 500mg  QID and iv flagyl per GI recommendation, ID consulted per GI recommendation -Clears as tolerated   AKI on CKDIII -cr 1.4 on presentation, cr improved to 1.15  Then 1.32  -continue hydration, d/c lisinopril -repeat bmp in am, renal dosing meds, avoid nephrotoxin  Hyponatremia -likely from dehydration -continue ivf  Metabolic acidosis: likely from diarrhea -was on bicarb drip, now start on oral bicarb  HTN:  Continue propranolol, hold lisinopril   Hypothyroidism: Continue synthroid  Baseline independent per daughter  Code Status: full  Family Communication: patient and daughter at bedside  Disposition Plan: not ready to discharge, severe c diff colitis at risk of decompensation    Consultants:  GI  ID  Procedures:  none  Antibiotics:  Oral vanc/iv flagyl   Objective: BP  135/65 (BP Location: Left Arm)   Pulse 86   Temp 99.9 F (37.7 C) (Oral)   Resp 18   Ht 5\' 3"  (1.6 m)   Wt 75.7 kg   SpO2 100%   BMI 29.56 kg/m   Intake/Output Summary (Last 24 hours) at 08/17/2018 0757 Last data filed at 08/16/2018 1843 Gross per 24 hour  Intake 2100.57 ml  Output 1 ml  Net 2099.57 ml   Filed Weights   08/16/18 0033 08/16/18 0500 08/17/18 0422  Weight: 73.6 kg 74.5 kg 75.7 kg    Exam: Patient is examined daily including today on 08/17/2018, exams remain the same as of yesterday except that has changed    General:  Still weak and frail but Appear stronger compare to yesterday, oriented x3  Cardiovascular: RRR  Respiratory: CTABL  Abdomen: Soft/ND/NT, positive BS  Musculoskeletal: No Edema  Neuro: alert and  oriented x3  Data Reviewed: Basic Metabolic Panel: Recent Labs  Lab 08/13/18 1355 08/30/2018 1633 08/16/18 0759 08/17/18 0602  NA 134* 123* 128* 127*  K 4.0 3.8 3.8 3.4*  CL 105 97* 100 101  CO2 20 16* 15* 16*  GLUCOSE 99 120* 123* 135*  BUN 32* 24* 17 17  CREATININE 1.42* 1.35* 1.15* 1.32*  CALCIUM 9.1 7.6* 7.7* 7.2*  MG  --  1.4*  --  1.8   Liver Function Tests: Recent Labs  Lab 08/26/2018 1633 08/16/18 0759  AST 17 19  ALT 16 16  ALKPHOS 56 62  BILITOT 0.5 0.5  PROT 6.4* 6.2*  ALBUMIN 3.0* 2.8*   Recent Labs  Lab 08/23/2018 1633  LIPASE 30   No results for input(s): AMMONIA in the last 168  hours. CBC: Recent Labs  Lab 08/14/2018 1633 08/16/18 0535 08/17/18 0602  WBC 21.7* 30.2* 22.4*  NEUTROABS 18.8*  --  18.6*  HGB 13.3 13.8 12.1  HCT 40.9 41.8 38.4  MCV 90.3 88.6 93.2  PLT 365 306 289   Cardiac Enzymes:   No results for input(s): CKTOTAL, CKMB, CKMBINDEX, TROPONINI in the last 168 hours. BNP (last 3 results) No results for input(s): BNP in the last 8760 hours.  ProBNP (last 3 results) No results for input(s): PROBNP in the last 8760 hours.  CBG: Recent Labs  Lab 08/16/18 0730  GLUCAP 117*    Recent  Results (from the past 240 hour(s))  Clostridium difficile EIA     Status: Abnormal   Collection Time: 08/14/18 12:00 AM   Specimen: Stool   STOOL  Result Value Ref Range Status   C difficile Toxins A+B, EIA Positive (A) Negative Final  Gastrointestinal Panel by PCR , Stool     Status: None   Collection Time: 08/21/2018  8:31 PM   Specimen: Stool  Result Value Ref Range Status   Campylobacter species NOT DETECTED NOT DETECTED Final   Plesimonas shigelloides NOT DETECTED NOT DETECTED Final   Salmonella species NOT DETECTED NOT DETECTED Final   Yersinia enterocolitica NOT DETECTED NOT DETECTED Final   Vibrio species NOT DETECTED NOT DETECTED Final   Vibrio cholerae NOT DETECTED NOT DETECTED Final   Enteroaggregative E coli (EAEC) NOT DETECTED NOT DETECTED Final   Enteropathogenic E coli (EPEC) NOT DETECTED NOT DETECTED Final   Enterotoxigenic E coli (ETEC) NOT DETECTED NOT DETECTED Final   Shiga like toxin producing E coli (STEC) NOT DETECTED NOT DETECTED Final   Shigella/Enteroinvasive E coli (EIEC) NOT DETECTED NOT DETECTED Final   Cryptosporidium NOT DETECTED NOT DETECTED Final   Cyclospora cayetanensis NOT DETECTED NOT DETECTED Final   Entamoeba histolytica NOT DETECTED NOT DETECTED Final   Giardia lamblia NOT DETECTED NOT DETECTED Final   Adenovirus F40/41 NOT DETECTED NOT DETECTED Final   Astrovirus NOT DETECTED NOT DETECTED Final   Norovirus GI/GII NOT DETECTED NOT DETECTED Final   Rotavirus A NOT DETECTED NOT DETECTED Final   Sapovirus (I, II, IV, and V) NOT DETECTED NOT DETECTED Final    Comment: Performed at [facility], [address] [place], [place] [address]  C Difficile Quick Screen w PCR reflex     Status: Abnormal   Collection Time: 08/13/2018  8:31 PM   Specimen: Stool  Result Value Ref Range Status   C Diff antigen POSITIVE (A) NEGATIVE Final   C Diff toxin POSITIVE (A) NEGATIVE Final   C Diff interpretation Toxin producing C. difficile detected.  Final     Comment: CRITICAL RESULT CALLED TO, READ BACK BY AND VERIFIED WITHAlfonso Patten GROVES RN 2124 08/08/2018 A NAVARRO Performed at [facility], [place] [address] [place], [place] [address]   SARS Coronavirus 2 (CEPHEID - Performed in [place] hospital lab), Hosp Order     Status: None   Collection Time: 08/09/2018  9:23 PM   Specimen: Nasopharyngeal Swab  Result Value Ref Range Status   SARS Coronavirus 2 NEGATIVE NEGATIVE Final    Comment: (NOTE) If result is NEGATIVE SARS-CoV-2 target nucleic acids are NOT DETECTED. The SARS-CoV-2 RNA is generally detectable in upper and lower  respiratory specimens during the acute phase of infection. The lowest  concentration of SARS-CoV-2 viral copies this assay can detect is 250  copies / mL. A negative result does not  preclude SARS-CoV-2 infection  and should not be used as the sole basis for treatment or other  patient management decisions.  A negative result may occur with  improper specimen collection / handling, submission of specimen other  than nasopharyngeal swab, presence of viral mutation(s) within the  areas targeted by this assay, and inadequate number of viral copies  (<250 copies / mL). A negative result must be combined with clinical  observations, patient history, and epidemiological information. If result is POSITIVE SARS-CoV-2 target nucleic acids are DETECTED. The SARS-CoV-2 RNA is generally detectable in upper and lower  respiratory specimens dur ing the acute phase of infection.  Positive  results are indicative of active infection with SARS-CoV-2.  Clinical  correlation with patient history and other diagnostic information is  necessary to determine patient infection status.  Positive results do  not rule out bacterial infection or co-infection with other viruses. If result is PRESUMPTIVE POSTIVE SARS-CoV-2 nucleic acids MAY BE PRESENT.   A presumptive positive result was obtained on the submitted specimen  and  confirmed on repeat testing.  While 2019 novel coronavirus  (SARS-CoV-2) nucleic acids may be present in the submitted sample  additional confirmatory testing may be necessary for epidemiological  and / or clinical management purposes  to differentiate between  SARS-CoV-2 and other Sarbecovirus currently known to infect humans.  If clinically indicated additional testing with an alternate test  methodology [phone]) is advised. The SARS-CoV-2 RNA is generally  detectable in upper and lower respiratory sp ecimens during the acute  phase of infection. The expected result is Negative. Fact Sheet for Patients:  StrictlyIdeas.no Fact Sheet for Healthcare Providers: BankingDealers.co.za This test is not yet approved or cleared by the [place] FDA and has been authorized for detection and/or diagnosis of SARS-CoV-2 by FDA under an Emergency Use Authorization (EUA).  This EUA will remain in effect (meaning this test can be used) for the duration of the COVID-19 declaration under Section 564(b)(1) of the Act, 21 U.S.C. section 360bbb-3(b)(1), unless the authorization is terminated or revoked sooner. Performed at [facility], [place] [address], [place], [place] [address]      Studies: No results found.  Scheduled Meds: . enoxaparin (LOVENOX) injection  40 mg Subcutaneous Daily  . estradiol  2 g Vaginal Q48H  . feeding supplement (PRO-STAT SUGAR FREE 64)  30 mL Oral BID  . levothyroxine  112 mcg Oral Daily  . propranolol ER  60 mg Oral Daily  . topiramate  50 mg Oral Daily  . vancomycin  500 mg Oral QID    Continuous Infusions: . dextrose 5% lactated ringers 75 mL/hr at 08/16/18 2016  . metronidazole 500 mg (08/17/18 0058)  . sodium chloride       Time spent: 25mins I have personally reviewed and interpreted on  08/17/2018 daily labs, tele strips, imagings as discussed above under date review session and  assessment and plans.  I reviewed all nursing notes, pharmacy notes, consultant notes,  vitals, pertinent old records  I have discussed plan of care as described above with RN , patient and family on 08/17/2018   Florencia Reasons MD, PhD  [facility] Pager [phone]. If 7PM-7AM, please contact night-coverage at [email or web address], password [facility] 08/17/2018, 7:57 AM  LOS: 2 days

## 2018-08-18 DIAGNOSIS — N179 Acute kidney failure, unspecified: Secondary | ICD-10-CM

## 2018-08-18 DIAGNOSIS — D72829 Elevated white blood cell count, unspecified: Secondary | ICD-10-CM

## 2018-08-18 DIAGNOSIS — E876 Hypokalemia: Secondary | ICD-10-CM

## 2018-08-18 DIAGNOSIS — A0472 Enterocolitis due to Clostridium difficile, not specified as recurrent: Secondary | ICD-10-CM

## 2018-08-18 DIAGNOSIS — K5100 Ulcerative (chronic) pancolitis without complications: Secondary | ICD-10-CM

## 2018-08-18 DIAGNOSIS — Z87440 Personal history of urinary (tract) infections: Secondary | ICD-10-CM

## 2018-08-18 DIAGNOSIS — Z8719 Personal history of other diseases of the digestive system: Secondary | ICD-10-CM

## 2018-08-18 LAB — CBC WITH DIFFERENTIAL/PLATELET
Abs Immature Granulocytes: 0.95 10*3/uL — ABNORMAL HIGH (ref 0.00–0.07)
Basophils Absolute: 0.2 10*3/uL — ABNORMAL HIGH (ref 0.0–0.1)
Basophils Relative: 1 %
Eosinophils Absolute: 0.3 10*3/uL (ref 0.0–0.5)
Eosinophils Relative: 1 %
HCT: 38.8 % (ref 36.0–46.0)
Hemoglobin: 12.6 g/dL (ref 12.0–15.0)
Immature Granulocytes: 4 %
Lymphocytes Relative: 5 %
Lymphs Abs: 1.2 10*3/uL (ref 0.7–4.0)
MCH: 29.3 pg (ref 26.0–34.0)
MCHC: 32.5 g/dL (ref 30.0–36.0)
MCV: 90.2 fL (ref 80.0–100.0)
Monocytes Absolute: 2.8 10*3/uL — ABNORMAL HIGH (ref 0.1–1.0)
Monocytes Relative: 13 %
Neutro Abs: 16.6 10*3/uL — ABNORMAL HIGH (ref 1.7–7.7)
Neutrophils Relative %: 76 %
Platelets: 345 10*3/uL (ref 150–400)
RBC: 4.3 MIL/uL (ref 3.87–5.11)
RDW: 13.2 % (ref 11.5–15.5)
WBC: 22 10*3/uL — ABNORMAL HIGH (ref 4.0–10.5)
nRBC: 0 % (ref 0.0–0.2)

## 2018-08-18 LAB — BASIC METABOLIC PANEL
Anion gap: 8 (ref 5–15)
BUN: 22 mg/dL (ref 8–23)
CO2: 18 mmol/L — ABNORMAL LOW (ref 22–32)
Calcium: 7.4 mg/dL — ABNORMAL LOW (ref 8.9–10.3)
Chloride: 102 mmol/L (ref 98–111)
Creatinine, Ser: 1.32 mg/dL — ABNORMAL HIGH (ref 0.44–1.00)
GFR calc Af Amer: 41 mL/min — ABNORMAL LOW (ref >60)
GFR calc non Af Amer: 36 mL/min — ABNORMAL LOW (ref >60)
Glucose, Bld: 91 mg/dL (ref 70–99)
Potassium: 3.3 mmol/L — ABNORMAL LOW (ref 3.5–5.1)
Sodium: 128 mmol/L — ABNORMAL LOW (ref 135–145)

## 2018-08-18 LAB — LACTIC ACID, PLASMA: Lactic Acid, Venous: 1.1 mmol/L (ref 0.5–1.9)

## 2018-08-18 MED ORDER — POTASSIUM CHLORIDE CRYS ER 20 MEQ PO TBCR
40.0000 meq | EXTENDED_RELEASE_TABLET | Freq: Once | ORAL | Status: AC
  Administered 2018-08-18: 40 meq via ORAL
  Filled 2018-08-18: qty 2

## 2018-08-18 MED ORDER — DEXTROSE IN LACTATED RINGERS 5 % IV SOLN
INTRAVENOUS | Status: DC
  Administered 2018-08-18 – 2018-08-19 (×2): via INTRAVENOUS

## 2018-08-18 MED ORDER — FIDAXOMICIN 200 MG PO TABS: 200.0000 mg | ORAL_TABLET | Freq: Two times a day (BID) | ORAL | Status: DC

## 2018-08-18 MED ORDER — FIDAXOMICIN 200 MG PO TABS
200.0000 mg | ORAL_TABLET | Freq: Two times a day (BID) | ORAL | Status: DC
  Administered 2018-08-18 – 2018-08-19 (×3): 200 mg via ORAL
  Filled 2018-08-18 (×4): qty 1

## 2018-08-18 MED ORDER — MAGNESIUM SULFATE 2 GM/50ML IV SOLN
2.0000 g | Freq: Once | INTRAVENOUS | Status: AC
  Administered 2018-08-18: 2 g via INTRAVENOUS
  Filled 2018-08-18: qty 50

## 2018-08-18 NOTE — Progress Notes (Signed)
The patient is still symptomatic from her C. Difficile colitis. Still with diarrhea. Complains of abdominal bloating.  Abdomen soft with slight tenderness in lower abdomen  Impression C. Difficile colitis  Plan: Patient was seen by infectious disease. She is being switched to Dificid, from vancomycin. Follow clinically.

## 2018-08-18 NOTE — Progress Notes (Signed)
[place] [facility]   Reason for visit: Follow up on C. Diff pancolitis  Interval History: Nursing reports 5-6 bowel movements today thus far (same as yesterday). She has been hypersomnolent most of the morning thus far after receiving ambien last PM (daughter states medication did same thing to her mother when she last took it ~10 years ago). GI requesting that pt remain on liquid diet but she is refusing to eat this. Abdominal tenderness persisting to LQs. PT attempted to get pt to chair this AM but efforts unsuccessful. WBC trends, fever curve, I/Os, imaging, and ABX usage all independently reviewed    Current Facility-Administered Medications:    acetaminophen (TYLENOL) tablet 650 mg, 650 mg, Oral, Q6H PRN, 650 mg at 08/17/18 0827 **OR** acetaminophen (TYLENOL) suppository 650 mg, 650 mg, Rectal, Q6H PRN, Jani Gravel, MD   enoxaparin (LOVENOX) injection 30 mg, 30 mg, Subcutaneous, Daily, Arlyn Dunning M, RPH, 30 mg at 08/18/18 1132   estradiol (ESTRACE) vaginal cream 2 g, 2 g, Vaginal, Q48H, Jani Gravel, MD   feeding supplement (PRO-STAT SUGAR FREE 64) liquid 30 mL, 30 mL, Oral, BID, Jani Gravel, MD, 30 mL at 08/18/18 1133   ipratropium-albuterol (DUONEB) 0.5-2.5 (3) MG/3ML nebulizer solution 3 mL, 3 mL, Nebulization, Q6H PRN, Florencia Reasons, MD   lactated ringers infusion, , Intravenous, Continuous, Florencia Reasons, MD, Last Rate: 50 mL/hr at 08/17/18 1449   levothyroxine (SYNTHROID) tablet 112 mcg, 112 mcg, Oral, Daily, Jani Gravel, MD, 112 mcg at 08/18/18 0646   liver oil-zinc oxide (DESITIN) 40 % ointment, , Topical, PRN, Florencia Reasons, MD   LORazepam (ATIVAN) tablet 1 mg, 1 mg, Oral, QHS PRN, Jani Gravel, MD, 1 mg at 08/17/18 2326   magnesium sulfate IVPB 2 g 50 mL, 2 g, Intravenous, Once, Florencia Reasons, MD, Last Rate: 50 mL/hr at 08/18/18 1257, 2 g at 08/18/18 1257   metroNIDAZOLE (FLAGYL) IVPB 500 mg, 500 mg, Intravenous, Q8H, Florencia Reasons, MD, Last Rate: 100 mL/hr at 08/18/18 0859,  500 mg at 08/18/18 0859   ondansetron (ZOFRAN) injection 4 mg, 4 mg, Intravenous, Q6H PRN, Blount, Xenia T, NP, 4 mg at 08/16/18 1959   propranolol ER (INDERAL LA) 24 hr capsule 60 mg, 60 mg, Oral, Daily, Jani Gravel, MD, 60 mg at 08/18/18 1132   sodium bicarbonate tablet 650 mg, 650 mg, Oral, TID, Florencia Reasons, MD, 650 mg at 08/18/18 1132   topiramate (TOPAMAX) tablet 50 mg, 50 mg, Oral, QHS, Florencia Reasons, MD   vancomycin (VANCOCIN) 50 mg/mL oral solution 125 mg, 125 mg, Oral, QID, Shreyan Hinz, Evern Core, MD, 125 mg at 08/18/18 1133   Physical Exam:   Vitals:   08/18/18 0647 08/18/18 0843  BP: 137/81 119/72  Pulse: 84 84  Resp: 18 (!) 24  Temp: 98.7 F (37.1 C) 99.8 F (37.7 C)  SpO2: (!) 74% 96%   Physical Exam Gen: lethargic but arousable, moderate distress secondary to pelvic tenderness/nausea, A&Ox 2 per daughter Head: NCAT, no temporal wasting evident EENT: PERRL, EOMI, MMM, adequate dentition Neck: supple, no JVD CV: NRRR, no murmurs evident Pulm: CTA bilaterally, no wheeze or retractions Abd: soft, NTND, +BS Extrems: no LE edema, 1+ pulses Skin: no rashes, poor skin turgor Neuro: CN II-XII grossly intact, no focal neurologic deficits appreciated, gait was not assessed, A&Ox 2   Review of Systems:  Review of Systems  Constitutional: Negative for chills, fever and weight loss.  HENT: Negative for congestion, hearing loss, sinus pain and sore throat.  Eyes: Negative for blurred vision, photophobia and discharge.  Respiratory: Negative for cough, hemoptysis and shortness of breath.   Cardiovascular: Negative for chest pain, palpitations, orthopnea and leg swelling.  Gastrointestinal: Positive for abdominal pain, diarrhea and nausea. Negative for constipation, heartburn and vomiting.  Genitourinary: Negative for dysuria, flank pain, frequency and urgency.  Musculoskeletal: Negative for back pain, joint pain and myalgias.  Skin: Negative for itching and rash.  Neurological:  Negative for tremors, seizures, weakness and headaches.  Endo/Heme/Allergies: Negative for polydipsia. Does not bruise/bleed easily.  Psychiatric/Behavioral: Negative for depression and substance abuse. The patient is not nervous/anxious and does not have insomnia.      Lab Results  Component Value Date   WBC 22.0 (H) 08/18/2018   HGB 12.6 08/18/2018   HCT 38.8 08/18/2018   MCV 90.2 08/18/2018   PLT 345 08/18/2018    Lab Results  Component Value Date   CREATININE 1.32 (H) 08/18/2018   BUN 22 08/18/2018   NA 128 (L) 08/18/2018   K 3.3 (L) 08/18/2018   CL 102 08/18/2018   CO2 18 (L) 08/18/2018    Lab Results  Component Value Date   ALT 16 08/16/2018   AST 19 08/16/2018   ALKPHOS 62 08/16/2018     Microbiology: Recent Results (from the past 240 hour(s))  Clostridium difficile EIA     Status: Abnormal   Collection Time: 08/14/18 12:00 AM   Specimen: Stool   STOOL  Result Value Ref Range Status   C difficile Toxins A+B, EIA Positive (A) Negative Final  Gastrointestinal Panel by PCR , Stool     Status: None   Collection Time: 08/23/2018  8:31 PM   Specimen: Stool  Result Value Ref Range Status   Campylobacter species NOT DETECTED NOT DETECTED Final   Plesimonas shigelloides NOT DETECTED NOT DETECTED Final   Salmonella species NOT DETECTED NOT DETECTED Final   Yersinia enterocolitica NOT DETECTED NOT DETECTED Final   Vibrio species NOT DETECTED NOT DETECTED Final   Vibrio cholerae NOT DETECTED NOT DETECTED Final   Enteroaggregative E coli (EAEC) NOT DETECTED NOT DETECTED Final   Enteropathogenic E coli (EPEC) NOT DETECTED NOT DETECTED Final   Enterotoxigenic E coli (ETEC) NOT DETECTED NOT DETECTED Final   Shiga like toxin producing E coli (STEC) NOT DETECTED NOT DETECTED Final   Shigella/Enteroinvasive E coli (EIEC) NOT DETECTED NOT DETECTED Final   Cryptosporidium NOT DETECTED NOT DETECTED Final   Cyclospora cayetanensis NOT DETECTED NOT DETECTED Final   Entamoeba  histolytica NOT DETECTED NOT DETECTED Final   Giardia lamblia NOT DETECTED NOT DETECTED Final   Adenovirus F40/41 NOT DETECTED NOT DETECTED Final   Astrovirus NOT DETECTED NOT DETECTED Final   Norovirus GI/GII NOT DETECTED NOT DETECTED Final   Rotavirus A NOT DETECTED NOT DETECTED Final   Sapovirus (I, II, IV, and V) NOT DETECTED NOT DETECTED Final    Comment: Performed at [facility], [address] [place], [place] [address]  C Difficile Quick Screen w PCR reflex     Status: Abnormal   Collection Time: 09/01/2018  8:31 PM   Specimen: Stool  Result Value Ref Range Status   C Diff antigen POSITIVE (A) NEGATIVE Final   C Diff toxin POSITIVE (A) NEGATIVE Final   C Diff interpretation Toxin producing C. difficile detected.  Final    Comment: CRITICAL RESULT CALLED TO, READ BACK BY AND VERIFIED WITHAlfonso Patten GROVES RN 2124 08/14/2018 A NAVARRO Performed at [facility], [place]  [address] [place], [place] [address]   SARS Coronavirus 2 (CEPHEID - Performed in [facility] hospital lab), Hosp Order     Status: None   Collection Time: 08/12/2018  9:23 PM   Specimen: Nasopharyngeal Swab  Result Value Ref Range Status   SARS Coronavirus 2 NEGATIVE NEGATIVE Final    Comment: (NOTE) If result is NEGATIVE SARS-CoV-2 target nucleic acids are NOT DETECTED. The SARS-CoV-2 RNA is generally detectable in upper and lower  respiratory specimens during the acute phase of infection. The lowest  concentration of SARS-CoV-2 viral copies this assay can detect is 250  copies / mL. A negative result does not preclude SARS-CoV-2 infection  and should not be used as the sole basis for treatment or other  patient management decisions.  A negative result may occur with  improper specimen collection / handling, submission of specimen other  than nasopharyngeal swab, presence of viral mutation(s) within the  areas targeted by this assay, and inadequate number of viral copies  (<250 copies / mL).  A negative result must be combined with clinical  observations, patient history, and epidemiological information. If result is POSITIVE SARS-CoV-2 target nucleic acids are DETECTED. The SARS-CoV-2 RNA is generally detectable in upper and lower  respiratory specimens dur ing the acute phase of infection.  Positive  results are indicative of active infection with SARS-CoV-2.  Clinical  correlation with patient history and other diagnostic information is  necessary to determine patient infection status.  Positive results do  not rule out bacterial infection or co-infection with other viruses. If result is PRESUMPTIVE POSTIVE SARS-CoV-2 nucleic acids MAY BE PRESENT.   A presumptive positive result was obtained on the submitted specimen  and confirmed on repeat testing.  While 2019 novel coronavirus  (SARS-CoV-2) nucleic acids may be present in the submitted sample  additional confirmatory testing may be necessary for epidemiological  and / or clinical management purposes  to differentiate between  SARS-CoV-2 and other Sarbecovirus currently known to infect humans.  If clinically indicated additional testing with an alternate test  methodology [phone]) is advised. The SARS-CoV-2 RNA is generally  detectable in upper and lower respiratory sp ecimens during the acute  phase of infection. The expected result is Negative. Fact Sheet for Patients:  StrictlyIdeas.no Fact Sheet for Healthcare Providers: BankingDealers.co.za This test is not yet approved or cleared by the [place] FDA and has been authorized for detection and/or diagnosis of SARS-CoV-2 by FDA under an Emergency Use Authorization (EUA).  This EUA will remain in effect (meaning this test can be used) for the duration of the COVID-19 declaration under Section 564(b)(1) of the Act, 21 U.S.C. section 360bbb-3(b)(1), unless the authorization is terminated or revoked  sooner. Performed at [facility], [place] [address] [place], [place] [address]   Culture, blood (routine x 2)     Status: None (Preliminary result)   Collection Time: 08/16/18 12:53 PM   Specimen: BLOOD  Result Value Ref Range Status   Specimen Description   Final    BLOOD RIGHT HAND Performed at [facility] [address], [place], [place] [address]    Special Requests   Final    BOTTLES DRAWN AEROBIC ONLY Blood Culture results may not be optimal due to an inadequate volume of blood received in culture bottles Performed at [facility] [address] [place], [place] [address]    Culture   Final    NO GROWTH 1 DAY Performed at [facility], [place] [address], [place], [place] [address]    Report Status PENDING  Incomplete  Culture, blood (routine x 2)     Status: None (Preliminary result)   Collection Time: 08/16/18  1:07 PM   Specimen: BLOOD  Result Value Ref Range Status   Specimen Description   Final    BLOOD LEFT HAND Performed at [place] [address] [place], [place] [address]    Special Requests   Final    BOTTLES DRAWN AEROBIC ONLY Blood Culture results may not be optimal due to an inadequate volume of blood received in culture bottles Performed at [facility] [address], [place], [place] [address]    Culture   Final    NO GROWTH 1 DAY Performed at [facility], [place] [address], [place], [place] [address]    Report Status PENDING  Incomplete    Impression/Plan: The patient is an 85 y/o [place] female with recent diverticulitis/UTI now admitted with C. Diff colitis, fever, leukocytosis, ARF, and FTT.  1. Severe C. Diff colitis - Pt diagnosed with diverticulitis/UTI on 07/30/18 when she was evaluated in the ER and given a 10 day course of omnicef/flagyl. Daughter now contradicts pt's earlier story and states diarrhea started ~2-3 days after ABX ended  (rather than a month ago as pt previously had stated). She was instructed to take imodium by her PCP, which she did twice PTA. Her fever and nausea began shortly after this. - ensure pt is ambulated to chair daily for purposes of exchanging bed linen to reduce spore burden/risk for re-inoculation - given cocnerns for poor clinical response thus far, will change PO vancomycin to dificid 200 mg PO q 12 hrs in an effort to reduce risk of further disruption to her stool microbiome - encourage yogurt with all meals and transition more towards a regular diet as solid food will assist in mechanically expelling C. Diff spores from sidewalls of intestine (if she remains on a liquid diet, she will continue to have only liquid stools). Enteral feeding will be vastly superior to TPN but pt's FTT remains a concern. Avoid sedating medications (would use zofran for nausea if persists) as this will worsen FTT picture. - continue IV flagyl for now - consider checking CMP q 2-3 days to trend her albumin as marker of nutritional status +/- malabsorptive process  2. Leukocytosis - leukamoid reaction consistent with known CDI. Leukocytosis does not always trend in conjunction with clinical response but would continue to check CBC w/ diff daily.  3. Fever - now resolved x 48 hrs. Blood cxs are unrevealing thus far. - if returns, consider checking contrasted CT of abdomen/pelvis to R/O complicating abscess  4. ARF - Cr peaked at 1.42 the day of admission. Only slight change noted to Cr but unclear if pt is adequately maintaining PO fluids/nutrition. See above. - check Cr daily to follow trend  > 45 minutes of face-to-face time spent on pt today in addition to 30 minutes for further coordination of care

## 2018-08-18 NOTE — Evaluation (Signed)
Physical Therapy Evaluation Patient Details Name: Anita Banks MRN: [id number] DOB: August 22, 1927 Today's Date: 08/18/2018   History of Present Illness  Patient is an 85 year old female admitted 08/13/2018 with C difficile colitis. PMH: HTN, anxiety, migraines.  Clinical Impression  Pt admitted with above diagnosis. Pt currently with functional limitations due to the deficits listed below (see PT Problem List). Patient functioning well below her baseline currently. Feeling a bit better today but still incontinent. Very tired/fatigued. Patient required mod-min assist for all mobility and fatigue quite easily with 4 feet ambulation side-stepping to recline and back to bed, sitting for 5-10 minutes. Patient was incontinent in recliner. PT assisted patient back to bed for nursing to perform hygiene. Daughter really doesn't want patient to go to SNF but not sure if she can currently take care of patient. PTA, patient had aide 2 1/2 hrs/day and walked 1 mile each day. Assist for showering on tub bench for safety. Pt will benefit from skilled PT to increase their independence and safety with mobility to allow discharge to the venue listed below.       Follow Up Recommendations SNF;Supervision/Assistance - 24 hour(Daughter wanted to take patient home.)    Equipment Recommendations  Rolling walker with 5" wheels;3in1 (PT)    Recommendations for Other Services       Precautions / Restrictions Precautions Precautions: Fall      Mobility  Bed Mobility Overal bed mobility: Needs Assistance Bed Mobility: Rolling;Sidelying to Sit;Sit to Sidelying Rolling: Mod assist Sidelying to sit: Mod assist;HOB elevated     Sit to sidelying: Mod assist;HOB elevated General bed mobility comments: bedrail use  Transfers Overall transfer level: Needs assistance Equipment used: Rolling walker (2 wheeled) Transfers: Sit to/from [facility] Sit to Stand: Mod assist Stand pivot transfers:  Min assist          Ambulation/Gait Ambulation/Gait assistance: Min assist Gait Distance (Feet): 4 Feet Assistive device: Rolling walker (2 wheeled) Gait Pattern/deviations: Step-to pattern;Decreased step length - right;Decreased step length - left;Decreased stride length;Shuffle;Trunk flexed;Narrow base of support Gait velocity: decreased   General Gait Details: slow, fatigued easily, requires lengthy rest breaks  Stairs            Wheelchair Mobility    Modified Rankin (Stroke Patients Only)       Balance Overall balance assessment: Needs assistance Sitting-balance support: Bilateral upper extremity supported;Feet supported Sitting balance-Leahy Scale: Fair     Standing balance support: Bilateral upper extremity supported;During functional activity Standing balance-Leahy Scale: Poor Standing balance comment: with RW                             Pertinent Vitals/Pain Pain Assessment: No/denies pain    Home Living Family/patient expects to be discharged to:: Private residence Living Arrangements: Children(PTA patient lived alone in apartment.) Available Help at Discharge: Family(daughter will temporarily move in with patient.) Type of Home: Apartment Home Access: Level entry     Home Layout: One level Home Equipment: Toilet riser;Walker - 4 wheels;Tub bench;Grab bars - toilet;Grab bars - tub/shower;Hand held shower head      Prior Function Level of Independence: Needs assistance   Gait / Transfers Assistance Needed: walked 1 mile per day with aide  ADL's / Homemaking Assistance Needed: aide 2 1/2 hours/day for cleaning and laundry, assist for showering        Hand Dominance   Dominant Hand: Right    Extremity/Trunk Assessment  Upper Extremity Assessment Upper Extremity Assessment: Generalized weakness    Lower Extremity Assessment Lower Extremity Assessment: Generalized weakness    Cervical / Trunk Assessment Cervical / Trunk  Assessment: Kyphotic  Communication   Communication: Prefers language other than English(Russian - daughter translates.)  Cognition Arousal/Alertness: Awake/alert;Lethargic Behavior During Therapy: WFL for tasks assessed/performed Overall Cognitive Status: Within Functional Limits for tasks assessed                                        General Comments      Exercises     Assessment/Plan    PT Assessment Patient needs continued PT services  PT Problem List Decreased strength;Decreased activity tolerance;Decreased knowledge of use of DME;Decreased balance;Decreased mobility       PT Treatment Interventions DME instruction;Therapeutic exercise;Gait training;Stair training;Balance training;Therapeutic activities;Patient/family education    PT Goals (Current goals can be found in the Care Plan section)  Acute Rehab PT Goals Patient Stated Goal: Get better and go home. PT Goal Formulation: With patient/family Time For Goal Achievement: 09/01/18 Potential to Achieve Goals: Fair    Frequency Min 2X/week   Barriers to discharge        Co-evaluation               AM-PAC PT "6 Clicks" Mobility  Outcome Measure Help needed turning from your back to your side while in a flat bed without using bedrails?: A Lot Help needed moving from lying on your back to sitting on the side of a flat bed without using bedrails?: A Lot Help needed moving to and from a bed to a chair (including a wheelchair)?: A Lot Help needed standing up from a chair using your arms (e.g., wheelchair or bedside chair)?: A Lot Help needed to walk in hospital room?: A Lot Help needed climbing 3-5 steps with a railing? : A Lot 6 Click Score: 12    End of Session Equipment Utilized During Treatment: Gait belt Activity Tolerance: Patient limited by fatigue Patient left: in bed;with nursing/sitter in room(patient incontinent while seated in recliner.) Nurse Communication: Mobility status PT  Visit Diagnosis: Unsteadiness on feet (R26.81);Muscle weakness (generalized) (M62.81);Other abnormalities of gait and mobility (R26.89)    Time: 1200-1230 PT Time Calculation (min) (ACUTE ONLY): 30 min   Charges:   PT Evaluation $PT Eval Low Complexity: 1 Low PT Treatments $Therapeutic Activity: 8-22 mins        Floria Raveling. Hartnett-Rands, MS, PT Per [facility] #[id number] 08/18/2018, 12:39 PM

## 2018-08-18 NOTE — Progress Notes (Signed)
PROGRESS NOTE  Anita Banks SFK:[id number] DOB: April 12, 1927 DOA: 08/08/2018 PCP: [name], MD  HPI/Recap of past 24 hours:  Daughter at bedside translating She appears more awake, reports ab is bloated,  intermittent ab pain , she continues to have diarrhea, per daughter the amount appears to have become less  feeling nauseous, no vomiting, able to keep meds/clears  down  Last fever of  100.9 on 7/10 afternoon, tmax last 24hrs is 99.8    Assessment/Plan: Principal Problem:   C. difficile colitis Active Problems:   Hypothyroidism due to acquired atrophy of thyroid   HYPERCHOLESTEROLEMIA   HTN (hypertension)   Dehydration   Hyponatremia   AKI (acute kidney injury) ([place])   CKD (chronic kidney disease), stage III ([place])   FTT (failure to thrive) in adult  Severe Cdiff colitis/spesis presented on admission, with fever, significant leukocytosis, tachypnea, pancolitis  -she was recently on abx for diverticulitis which was diagnosed at [facility] ED on 6/22 -CT ab on presentation this time showed "1. Marked edema of the colon throughout its course with pericolonic fluid and mesenteric edema. Findings are consistent with acute pancolitis, most likely infectious.",  -was on vanc 500mg  QID and iv flagyl , now on dificid/flagyl perID recommendation -GI/ID input appreciated, will follow recommendations    AKI on CKDIII -cr 1.4 on presentation, cr improved to 1.15  Then 1.32  -continue hydration, d/c lisinopril -repeat bmp in am, renal dosing meds, avoid nephrotoxin  Hyponatremia -likely from dehydration -continue ivf F7/CB  Metabolic acidosis: likely from diarrhea -was on bicarb drip, now start on oral bicarb  HTN:  Continue propranolol, hold lisinopril   Hypothyroidism: Continue synthroid  Baseline independent per daughter  Code Status: full  Family Communication: patient and daughter at bedside  Disposition Plan: not ready to discharge, severe c diff  colitis at risk of decompensation    Consultants:  GI  ID  Procedures:  none  Antibiotics:  Oral vanc from admission to 7/12, deficid from 7/12  iv flagyl from 7/11   Objective: BP 119/72 (BP Location: Right Arm)   Pulse 84   Temp 99.8 F (37.7 C) (Axillary)   Resp (!) 24 Comment: rn notified  Ht 5\' 3"  (1.6 m)   Wt 76.6 kg   SpO2 96%   BMI 29.91 kg/m   Intake/Output Summary (Last 24 hours) at 08/18/2018 1112 Last data filed at 08/18/2018 0600 Gross per 24 hour  Intake 1147.26 ml  Output -  Net 1147.26 ml   Filed Weights   08/16/18 0500 08/17/18 0422 08/18/18 0647  Weight: 74.5 kg 75.7 kg 76.6 kg    Exam: Patient is examined daily including today on 08/18/2018, exams remain the same as of yesterday except that has changed    General:  Still weak and frail but Appear stronger compare to yesterday, oriented x3  Cardiovascular: RRR  Respiratory: CTABL  Abdomen: soft, no guarding, no rebound,  positive BS  Musculoskeletal: No Edema  Neuro: alert and  oriented x3  Data Reviewed: Basic Metabolic Panel: Recent Labs  Lab 08/13/18 1355 09/02/2018 1633 08/16/18 0759 08/17/18 0602 08/18/18 0529  NA 134* 123* 128* 127* 128*  K 4.0 3.8 3.8 3.4* 3.3*  CL 105 97* 100 101 102  CO2 20 16* 15* 16* 18*  GLUCOSE 99 120* 123* 135* 91  BUN 32* 24* 17 17 22   CREATININE 1.42* 1.35* 1.15* 1.32* 1.32*  CALCIUM 9.1 7.6* 7.7* 7.2* 7.4*  MG  --  1.4*  --  1.8  --    Liver Function Tests: Recent Labs  Lab 08/14/2018 1633 08/16/18 0759  AST 17 19  ALT 16 16  ALKPHOS 56 62  BILITOT 0.5 0.5  PROT 6.4* 6.2*  ALBUMIN 3.0* 2.8*   Recent Labs  Lab 09/01/2018 1633  LIPASE 30   No results for input(s): AMMONIA in the last 168 hours. CBC: Recent Labs  Lab 08/28/2018 1633 08/16/18 0535 08/17/18 0602 08/18/18 0529  WBC 21.7* 30.2* 22.4* 22.0*  NEUTROABS 18.8*  --  18.6* 16.6*  HGB 13.3 13.8 12.1 12.6  HCT 40.9 41.8 38.4 38.8  MCV 90.3 88.6 93.2 90.2  PLT 365 306  289 345   Cardiac Enzymes:   No results for input(s): CKTOTAL, CKMB, CKMBINDEX, TROPONINI in the last 168 hours. BNP (last 3 results) No results for input(s): BNP in the last 8760 hours.  ProBNP (last 3 results) No results for input(s): PROBNP in the last 8760 hours.  CBG: Recent Labs  Lab 08/16/18 0730  GLUCAP 117*    Recent Results (from the past 240 hour(s))  Clostridium difficile EIA     Status: Abnormal   Collection Time: 08/14/18 12:00 AM   Specimen: Stool   STOOL  Result Value Ref Range Status   C difficile Toxins A+B, EIA Positive (A) Negative Final  Gastrointestinal Panel by PCR , Stool     Status: None   Collection Time: 09/05/2018  8:31 PM   Specimen: Stool  Result Value Ref Range Status   Campylobacter species NOT DETECTED NOT DETECTED Final   Plesimonas shigelloides NOT DETECTED NOT DETECTED Final   Salmonella species NOT DETECTED NOT DETECTED Final   Yersinia enterocolitica NOT DETECTED NOT DETECTED Final   Vibrio species NOT DETECTED NOT DETECTED Final   Vibrio cholerae NOT DETECTED NOT DETECTED Final   Enteroaggregative E coli (EAEC) NOT DETECTED NOT DETECTED Final   Enteropathogenic E coli (EPEC) NOT DETECTED NOT DETECTED Final   Enterotoxigenic E coli (ETEC) NOT DETECTED NOT DETECTED Final   Shiga like toxin producing E coli (STEC) NOT DETECTED NOT DETECTED Final   Shigella/Enteroinvasive E coli (EIEC) NOT DETECTED NOT DETECTED Final   Cryptosporidium NOT DETECTED NOT DETECTED Final   Cyclospora cayetanensis NOT DETECTED NOT DETECTED Final   Entamoeba histolytica NOT DETECTED NOT DETECTED Final   Giardia lamblia NOT DETECTED NOT DETECTED Final   Adenovirus F40/41 NOT DETECTED NOT DETECTED Final   Astrovirus NOT DETECTED NOT DETECTED Final   Norovirus GI/GII NOT DETECTED NOT DETECTED Final   Rotavirus A NOT DETECTED NOT DETECTED Final   Sapovirus (I, II, IV, and V) NOT DETECTED NOT DETECTED Final    Comment: Performed at [facility], [address] [place], [place] [address]  C Difficile Quick Screen w PCR reflex     Status: Abnormal   Collection Time: 08/10/2018  8:31 PM   Specimen: Stool  Result Value Ref Range Status   C Diff antigen POSITIVE (A) NEGATIVE Final   C Diff toxin POSITIVE (A) NEGATIVE Final   C Diff interpretation Toxin producing C. difficile detected.  Final    Comment: CRITICAL RESULT CALLED TO, READ BACK BY AND VERIFIED WITHAlfonso Patten GROVES RN 2124 08/19/2018 A NAVARRO Performed at [facility], [place] [address], [place], [place] [address]   SARS Coronavirus 2 (CEPHEID - Performed in [facility] hospital lab), Hosp Order     Status: None   Collection Time: 08/23/2018  9:23 PM   Specimen: Nasopharyngeal Swab  Result Value Ref Range Status   SARS Coronavirus 2 NEGATIVE NEGATIVE Final    Comment: (NOTE) If result is NEGATIVE SARS-CoV-2 target nucleic acids are NOT DETECTED. The SARS-CoV-2 RNA is generally detectable in upper and lower  respiratory specimens during the acute phase of infection. The lowest  concentration of SARS-CoV-2 viral copies this assay can detect is 250  copies / mL. A negative result does not preclude SARS-CoV-2 infection  and should not be used as the sole basis for treatment or other  patient management decisions.  A negative result may occur with  improper specimen collection / handling, submission of specimen other  than nasopharyngeal swab, presence of viral mutation(s) within the  areas targeted by this assay, and inadequate number of viral copies  (<250 copies / mL). A negative result must be combined with clinical  observations, patient history, and epidemiological information. If result is POSITIVE SARS-CoV-2 target nucleic acids are DETECTED. The SARS-CoV-2 RNA is generally detectable in upper and lower  respiratory specimens dur ing the acute phase of infection.  Positive  results are indicative of active infection with SARS-CoV-2.  Clinical  correlation with  patient history and other diagnostic information is  necessary to determine patient infection status.  Positive results do  not rule out bacterial infection or co-infection with other viruses. If result is PRESUMPTIVE POSTIVE SARS-CoV-2 nucleic acids MAY BE PRESENT.   A presumptive positive result was obtained on the submitted specimen  and confirmed on repeat testing.  While 2019 novel coronavirus  (SARS-CoV-2) nucleic acids may be present in the submitted sample  additional confirmatory testing may be necessary for epidemiological  and / or clinical management purposes  to differentiate between  SARS-CoV-2 and other Sarbecovirus currently known to infect humans.  If clinically indicated additional testing with an alternate test  methodology [phone]) is advised. The SARS-CoV-2 RNA is generally  detectable in upper and lower respiratory sp ecimens during the acute  phase of infection. The expected result is Negative. Fact Sheet for Patients:  StrictlyIdeas.no Fact Sheet for Healthcare Providers: BankingDealers.co.za This test is not yet approved or cleared by the [place] FDA and has been authorized for detection and/or diagnosis of SARS-CoV-2 by FDA under an Emergency Use Authorization (EUA).  This EUA will remain in effect (meaning this test can be used) for the duration of the COVID-19 declaration under Section 564(b)(1) of the Act, 21 U.S.C. section 360bbb-3(b)(1), unless the authorization is terminated or revoked sooner. Performed at [facility], [place] [address], [place], [place] [address]   Culture, blood (routine x 2)     Status: None (Preliminary result)   Collection Time: 08/16/18 12:53 PM   Specimen: BLOOD  Result Value Ref Range Status   Specimen Description   Final    BLOOD RIGHT HAND Performed at [place] [address], [place], [place] [address]    Special Requests    Final    BOTTLES DRAWN AEROBIC ONLY Blood Culture results may not be optimal due to an inadequate volume of blood received in culture bottles Performed at [place] [address] [place], [place] [address]    Culture   Final    NO GROWTH 1 DAY Performed at [facility], [place] [address] [place], [place] [address]    Report Status PENDING  Incomplete  Culture, blood (routine x 2)     Status: None (Preliminary result)   Collection Time: 08/16/18  1:07 PM  Specimen: BLOOD  Result Value Ref Range Status   Specimen Description   Final    BLOOD LEFT HAND Performed at [place] [address], [place], [place] [address]    Special Requests   Final    BOTTLES DRAWN AEROBIC ONLY Blood Culture results may not be optimal due to an inadequate volume of blood received in culture bottles Performed at [place] [address], [place], [place] [address]    Culture   Final    NO GROWTH 1 DAY Performed at [facility], [place] [address], [place], [place] [address]    Report Status PENDING  Incomplete     Studies: Dg Chest Port 1 View  Result Date: 08/17/2018 CLINICAL DATA:  Wheezing EXAM: PORTABLE CHEST 1 VIEW COMPARISON:  08/07/2017 FINDINGS: Low volume AP portable examination with minimal bibasilar atelectasis or scarring. No acute appearing airspace opacity. The heart and mediastinum are normal in size. IMPRESSION: Low volume AP portable examination with minimal bibasilar atelectasis or scarring. No acute appearing airspace opacity. The heart and mediastinum are normal in size. Electronically Signed   By: Eddie Candle M.D.   On: 08/17/2018 12:16    Scheduled Meds: . enoxaparin (LOVENOX) injection  30 mg Subcutaneous Daily  . estradiol  2 g Vaginal Q48H  . feeding supplement (PRO-STAT SUGAR FREE 64)  30 mL Oral BID  . levothyroxine  112 mcg Oral Daily  . potassium chloride  40 mEq Oral Once  . propranolol ER  60  mg Oral Daily  . sodium bicarbonate  650 mg Oral TID  . topiramate  50 mg Oral QHS  . vancomycin  125 mg Oral QID    Continuous Infusions: . lactated ringers 50 mL/hr at 08/17/18 1449  . magnesium sulfate bolus IVPB    . metronidazole 500 mg (08/18/18 0859)     Time spent: 89mins I have personally reviewed and interpreted on  08/18/2018 daily labs, imagings as discussed above under date review session and assessment and plans.  I reviewed all nursing notes, pharmacy notes, consultant notes,  vitals, pertinent old records  I have discussed plan of care as described above with RN , patient and family on 08/18/2018   Florencia Reasons MD, PhD  [facility] Pager [phone]. If 7PM-7AM, please contact night-coverage at [email or web address], password [email or web address] 08/18/2018, 11:12 AM  LOS: 3 days

## 2018-08-19 DIAGNOSIS — E860 Dehydration: Secondary | ICD-10-CM

## 2018-08-19 LAB — CBC WITH DIFFERENTIAL/PLATELET
Abs Immature Granulocytes: 1.42 10*3/uL — ABNORMAL HIGH (ref 0.00–0.07)
Basophils Absolute: 0.1 10*3/uL (ref 0.0–0.1)
Basophils Relative: 0 %
Eosinophils Absolute: 0.3 10*3/uL (ref 0.0–0.5)
Eosinophils Relative: 1 %
HCT: 39.5 % (ref 36.0–46.0)
Hemoglobin: 12.8 g/dL (ref 12.0–15.0)
Immature Granulocytes: 6 %
Lymphocytes Relative: 5 %
Lymphs Abs: 1.2 10*3/uL (ref 0.7–4.0)
MCH: 29.4 pg (ref 26.0–34.0)
MCHC: 32.4 g/dL (ref 30.0–36.0)
MCV: 90.6 fL (ref 80.0–100.0)
Monocytes Absolute: 4 10*3/uL — ABNORMAL HIGH (ref 0.1–1.0)
Monocytes Relative: 16 %
Neutro Abs: 18.5 10*3/uL — ABNORMAL HIGH (ref 1.7–7.7)
Neutrophils Relative %: 72 %
Platelets: 402 10*3/uL — ABNORMAL HIGH (ref 150–400)
RBC: 4.36 MIL/uL (ref 3.87–5.11)
RDW: 13.3 % (ref 11.5–15.5)
WBC: 25.6 10*3/uL — ABNORMAL HIGH (ref 4.0–10.5)
nRBC: 0.1 % (ref 0.0–0.2)

## 2018-08-19 LAB — LACTIC ACID, PLASMA: Lactic Acid, Venous: 1.3 mmol/L (ref 0.5–1.9)

## 2018-08-19 LAB — BASIC METABOLIC PANEL
Anion gap: 10 (ref 5–15)
BUN: 28 mg/dL — ABNORMAL HIGH (ref 8–23)
CO2: 15 mmol/L — ABNORMAL LOW (ref 22–32)
Calcium: 7.4 mg/dL — ABNORMAL LOW (ref 8.9–10.3)
Chloride: 102 mmol/L (ref 98–111)
Creatinine, Ser: 1.5 mg/dL — ABNORMAL HIGH (ref 0.44–1.00)
GFR calc Af Amer: 35 mL/min — ABNORMAL LOW (ref >60)
GFR calc non Af Amer: 31 mL/min — ABNORMAL LOW (ref >60)
Glucose, Bld: 144 mg/dL — ABNORMAL HIGH (ref 70–99)
Potassium: 3.9 mmol/L (ref 3.5–5.1)
Sodium: 127 mmol/L — ABNORMAL LOW (ref 135–145)

## 2018-08-19 LAB — MAGNESIUM: Magnesium: 2.3 mg/dL (ref 1.7–2.4)

## 2018-08-19 MED ORDER — HYDROMORPHONE HCL 1 MG/ML IJ SOLN
0.5000 mg | INTRAMUSCULAR | Status: DC | PRN
  Administered 2018-08-19 – 2018-08-20 (×4): 1 mg via INTRAVENOUS
  Administered 2018-08-21: 0.5 mg via INTRAVENOUS
  Administered 2018-08-22: 1 mg via INTRAVENOUS
  Filled 2018-08-19 (×8): qty 1

## 2018-08-19 NOTE — Care Management Important Message (Signed)
Important Message  Patient Details IM Letter given to [name] RN to present to the Patient Name: Anita Banks MRN: [id number] Date of Birth: 01/19/1928   Medicare Important Message Given:  Yes     [name] 08/19/2018, 12:12 PM

## 2018-08-19 NOTE — Progress Notes (Addendum)
PROGRESS NOTE  Anita Banks [id number]:[id number] DOB: 09/04/1927 DOA: 08/09/2018 PCP: [name], MD  HPI/Recap of past 24 hours: Daughter states that patient told her today and she "is dying", daughter expressed her wishes that her mother not suffer anymore, she wants to transition to comfort care at this time.   Assessment/Plan: Principal Problem:   C. difficile colitis Active Problems:   Hypothyroidism due to acquired atrophy of thyroid   HYPERCHOLESTEROLEMIA   HTN (hypertension)   Dehydration   Hyponatremia   AKI (acute kidney injury) ([place])   CKD (chronic kidney disease), stage III ([facility])   FTT (failure to thrive) in adult   Hypokalemia  Severe Cdiff colitis/spesis presented on admission, with fever, significant leukocytosis, tachypnea, pancolitis  - patient is worsening and family wants to transition to comfort care at this time - DNR ordered, comfort meds only - will consult palliative care, likely pt will be in-hospital death   AKI on CKDIII - no change  Hyponatremia -likely from dehydration  Metabolic acidosis: likely from diarrhea  HTN:   Hypothyroidism: Continue synthroid  Was baseline independent per daughter  Code Status: full  Family Communication: as above  Disposition Plan: comfort care , consulting palliative team   Kelly Splinter MD  pgr [phone] 08/19/2018, 11:46 AM    Consultants:  GI  ID  Procedures:  none  Antibiotics:  Oral vanc from admission to 7/12, deficid from 7/12  iv flagyl from 7/11   Objective: BP 127/72 (BP Location: Right Arm)   Pulse 80   Temp 98.8 F (37.1 C) (Oral)   Resp (!) 22 Comment: dynamap didn't save RR  Ht 5\' 3"  (1.6 m)   Wt 79.9 kg   SpO2 96%   BMI 31.20 kg/m   Intake/Output Summary (Last 24 hours) at 08/19/2018 1143 Last data filed at 08/19/2018 0600 Gross per 24 hour  Intake 1117.74 ml  Output -  Net 1117.74 ml   Filed Weights   08/17/18 0422 08/18/18 0647 08/19/18 0518   Weight: 75.7 kg 76.6 kg 79.9 kg    Exam:   General:  Still weak and frail, lethargic  Cardiovascular: RRR  Respiratory: CTABL  Abdomen: soft, no guarding, no rebound,  positive BS  Musculoskeletal: No Edema  Neuro: alert and  oriented x3  Data Reviewed: Basic Metabolic Panel: Recent Labs  Lab 08/22/2018 1633 08/16/18 0759 08/17/18 0602 08/18/18 0529 08/19/18 0556  NA 123* 128* 127* 128* 127*  K 3.8 3.8 3.4* 3.3* 3.9  CL 97* 100 101 102 102  CO2 16* 15* 16* 18* 15*  GLUCOSE 120* 123* 135* 91 144*  BUN 24* 17 17 22  28*  CREATININE 1.35* 1.15* 1.32* 1.32* 1.50*  CALCIUM 7.6* 7.7* 7.2* 7.4* 7.4*  MG 1.4*  --  1.8  --  2.3   Liver Function Tests: Recent Labs  Lab 09/03/2018 1633 08/16/18 0759  AST 17 19  ALT 16 16  ALKPHOS 56 62  BILITOT 0.5 0.5  PROT 6.4* 6.2*  ALBUMIN 3.0* 2.8*   Recent Labs  Lab 08/09/2018 1633  LIPASE 30   No results for input(s): AMMONIA in the last 168 hours. CBC: Recent Labs  Lab 08/27/2018 1633 08/16/18 0535 08/17/18 0602 08/18/18 0529 08/19/18 0556  WBC 21.7* 30.2* 22.4* 22.0* 25.6*  NEUTROABS 18.8*  --  18.6* 16.6* 18.5*  HGB 13.3 13.8 12.1 12.6 12.8  HCT 40.9 41.8 38.4 38.8 39.5  MCV 90.3 88.6 93.2 90.2 90.6  PLT 365 306 289 345 402*  Cardiac Enzymes:   No results for input(s): CKTOTAL, CKMB, CKMBINDEX, TROPONINI in the last 168 hours. BNP (last 3 results) No results for input(s): BNP in the last 8760 hours.  ProBNP (last 3 results) No results for input(s): PROBNP in the last 8760 hours.  CBG: Recent Labs  Lab 08/16/18 0730  GLUCAP 117*    Recent Results (from the past 240 hour(s))  Clostridium difficile EIA     Status: Abnormal   Collection Time: 08/14/18 12:00 AM   Specimen: Stool   STOOL  Result Value Ref Range Status   C difficile Toxins A+B, EIA Positive (A) Negative Final  Gastrointestinal Panel by PCR , Stool     Status: None   Collection Time: 08/18/2018  8:31 PM   Specimen: Stool  Result Value Ref  Range Status   Campylobacter species NOT DETECTED NOT DETECTED Final   Plesimonas shigelloides NOT DETECTED NOT DETECTED Final   Salmonella species NOT DETECTED NOT DETECTED Final   Yersinia enterocolitica NOT DETECTED NOT DETECTED Final   Vibrio species NOT DETECTED NOT DETECTED Final   Vibrio cholerae NOT DETECTED NOT DETECTED Final   Enteroaggregative E coli (EAEC) NOT DETECTED NOT DETECTED Final   Enteropathogenic E coli (EPEC) NOT DETECTED NOT DETECTED Final   Enterotoxigenic E coli (ETEC) NOT DETECTED NOT DETECTED Final   Shiga like toxin producing E coli (STEC) NOT DETECTED NOT DETECTED Final   Shigella/Enteroinvasive E coli (EIEC) NOT DETECTED NOT DETECTED Final   Cryptosporidium NOT DETECTED NOT DETECTED Final   Cyclospora cayetanensis NOT DETECTED NOT DETECTED Final   Entamoeba histolytica NOT DETECTED NOT DETECTED Final   Giardia lamblia NOT DETECTED NOT DETECTED Final   Adenovirus F40/41 NOT DETECTED NOT DETECTED Final   Astrovirus NOT DETECTED NOT DETECTED Final   Norovirus GI/GII NOT DETECTED NOT DETECTED Final   Rotavirus A NOT DETECTED NOT DETECTED Final   Sapovirus (I, II, IV, and V) NOT DETECTED NOT DETECTED Final    Comment: Performed at [facility], [address] [place], [place] [address]  C Difficile Quick Screen w PCR reflex     Status: Abnormal   Collection Time: 08/10/2018  8:31 PM   Specimen: Stool  Result Value Ref Range Status   C Diff antigen POSITIVE (A) NEGATIVE Final   C Diff toxin POSITIVE (A) NEGATIVE Final   C Diff interpretation Toxin producing C. difficile detected.  Final    Comment: CRITICAL RESULT CALLED TO, READ BACK BY AND VERIFIED WITHAlfonso Patten GROVES RN 2124 08/09/2018 A NAVARRO Performed at [facility], [place] [address], [place], [place] [address]   SARS Coronavirus 2 (CEPHEID - Performed in [place] hospital lab), Hosp Order     Status: None   Collection Time: 08/18/2018  9:23 PM   Specimen: Nasopharyngeal Swab   Result Value Ref Range Status   SARS Coronavirus 2 NEGATIVE NEGATIVE Final    Comment: (NOTE) If result is NEGATIVE SARS-CoV-2 target nucleic acids are NOT DETECTED. The SARS-CoV-2 RNA is generally detectable in upper and lower  respiratory specimens during the acute phase of infection. The lowest  concentration of SARS-CoV-2 viral copies this assay can detect is 250  copies / mL. A negative result does not preclude SARS-CoV-2 infection  and should not be used as the sole basis for treatment or other  patient management decisions.  A negative result may occur with  improper specimen collection / handling, submission of specimen other  than nasopharyngeal swab, presence of viral mutation(s)  within the  areas targeted by this assay, and inadequate number of viral copies  (<250 copies / mL). A negative result must be combined with clinical  observations, patient history, and epidemiological information. If result is POSITIVE SARS-CoV-2 target nucleic acids are DETECTED. The SARS-CoV-2 RNA is generally detectable in upper and lower  respiratory specimens dur ing the acute phase of infection.  Positive  results are indicative of active infection with SARS-CoV-2.  Clinical  correlation with patient history and other diagnostic information is  necessary to determine patient infection status.  Positive results do  not rule out bacterial infection or co-infection with other viruses. If result is PRESUMPTIVE POSTIVE SARS-CoV-2 nucleic acids MAY BE PRESENT.   A presumptive positive result was obtained on the submitted specimen  and confirmed on repeat testing.  While 2019 novel coronavirus  (SARS-CoV-2) nucleic acids may be present in the submitted sample  additional confirmatory testing may be necessary for epidemiological  and / or clinical management purposes  to differentiate between  SARS-CoV-2 and other Sarbecovirus currently known to infect humans.  If clinically indicated additional  testing with an alternate test  methodology [phone]) is advised. The SARS-CoV-2 RNA is generally  detectable in upper and lower respiratory sp ecimens during the acute  phase of infection. The expected result is Negative. Fact Sheet for Patients:  StrictlyIdeas.no Fact Sheet for Healthcare Providers: BankingDealers.co.za This test is not yet approved or cleared by the [place] FDA and has been authorized for detection and/or diagnosis of SARS-CoV-2 by FDA under an Emergency Use Authorization (EUA).  This EUA will remain in effect (meaning this test can be used) for the duration of the COVID-19 declaration under Section 564(b)(1) of the Act, 21 U.S.C. section 360bbb-3(b)(1), unless the authorization is terminated or revoked sooner. Performed at [facility], [place] [address], [place], [place] [address]   Culture, blood (routine x 2)     Status: None (Preliminary result)   Collection Time: 08/16/18 12:53 PM   Specimen: BLOOD  Result Value Ref Range Status   Specimen Description   Final    BLOOD RIGHT HAND Performed at [place] [address] [place], [place] [address]    Special Requests   Final    BOTTLES DRAWN AEROBIC ONLY Blood Culture results may not be optimal due to an inadequate volume of blood received in culture bottles Performed at [place] [address], [place], [place] [address]    Culture   Final    NO GROWTH 2 DAYS Performed at [place] [address] [place], [place] [address]    Report Status PENDING  Incomplete  Culture, blood (routine x 2)     Status: None (Preliminary result)   Collection Time: 08/16/18  1:07 PM   Specimen: BLOOD  Result Value Ref Range Status   Specimen Description   Final    BLOOD LEFT HAND Performed at [place] [address], [place], [place] [address]    Special Requests   Final     BOTTLES DRAWN AEROBIC ONLY Blood Culture results may not be optimal due to an inadequate volume of blood received in culture bottles Performed at [facility] [address] [place], [place] [address]    Culture   Final    NO GROWTH 2 DAYS Performed at [facility] [address] [place], [place] [address]    Report Status PENDING  Incomplete  Studies: No results found.  Scheduled Meds: . enoxaparin (LOVENOX) injection  30 mg Subcutaneous Daily  . estradiol  2 g Vaginal Q48H  . feeding supplement (PRO-STAT SUGAR FREE 64)  30 mL Oral BID  . fidaxomicin  200 mg Oral BID  . levothyroxine  112 mcg Oral Daily  . propranolol ER  60 mg Oral Daily  . sodium bicarbonate  650 mg Oral TID  . topiramate  50 mg Oral QHS    Continuous Infusions: . dextrose 5% lactated ringers 75 mL/hr at 08/19/18 1025  . metronidazole 500 mg (08/19/18 0840)     Time spent: 69mins I have personally reviewed and interpreted on  08/19/2018 daily labs, imagings as discussed above under date review session and assessment and plans.  I reviewed all nursing notes, pharmacy notes, consultant notes,  vitals, pertinent old records  I have discussed plan of care as described above with RN , patient and family on 08/19/2018   Sol Blazing MD, PhD  [facility] Pager [phone]. If 7PM-7AM, please contact night-coverage at [email or web address], password [facility] 08/19/2018, 11:43 AM  LOS: 4 days

## 2018-08-19 NOTE — Progress Notes (Signed)
[facility] Progress Note  Subjective: The patient is seen today.  Her daughter is present translating.  She does not feel good.  She was started on Dificid yesterday by infectious disease.  She is still having sensation of abdominal bloating and ongoing diarrhea.  White blood cell count remains elevated.  Objective: Vital signs in last 24 hours: Temp:  [97.9 F (36.6 C)-99.5 F (37.5 C)] 98.9 F (37.2 C) (07/13 0945) Pulse Rate:  [77-90] 90 (07/13 0945) Resp:  [20-31] 31 (07/13 0945) BP: (135-145)/(73-76) 135/73 (07/13 0945) SpO2:  [95 %-99 %] 95 % (07/13 0945) Weight:  [79.9 kg] 79.9 kg (07/13 0518) Weight change: 3.3 kg   PE:  Abdomen soft some mild discomfort lower abdomen  Lab Results: Results for orders placed or performed during the hospital encounter of 08/23/2018 (from the past 24 hour(s))  CBC with Differential/Platelet     Status: Abnormal   Collection Time: 08/19/18  5:56 AM  Result Value Ref Range   WBC 25.6 (H) 4.0 - 10.5 K/uL   RBC 4.36 3.87 - 5.11 MIL/uL   Hemoglobin 12.8 12.0 - 15.0 g/dL   HCT 39.5 36.0 - 46.0 %   MCV 90.6 80.0 - 100.0 fL   MCH 29.4 26.0 - 34.0 pg   MCHC 32.4 30.0 - 36.0 g/dL   RDW 13.3 11.5 - 15.5 %   Platelets 402 (H) 150 - 400 K/uL   nRBC 0.1 0.0 - 0.2 %   Neutrophils Relative % 72 %   Neutro Abs 18.5 (H) 1.7 - 7.7 K/uL   Lymphocytes Relative 5 %   Lymphs Abs 1.2 0.7 - 4.0 K/uL   Monocytes Relative 16 %   Monocytes Absolute 4.0 (H) 0.1 - 1.0 K/uL   Eosinophils Relative 1 %   Eosinophils Absolute 0.3 0.0 - 0.5 K/uL   Basophils Relative 0 %   Basophils Absolute 0.1 0.0 - 0.1 K/uL   WBC Morphology DOHLE BODIES    Immature Granulocytes 6 %   Abs Immature Granulocytes 1.42 (H) 0.00 - 0.07 K/uL  Basic metabolic panel     Status: Abnormal   Collection Time: 08/19/18  5:56 AM  Result Value Ref Range   Sodium 127 (L) 135 - 145 mmol/L   Potassium 3.9 3.5 - 5.1 mmol/L   Chloride 102 98 - 111 mmol/L   CO2 15 (L) 22 - 32 mmol/L    Glucose, Bld 144 (H) 70 - 99 mg/dL   BUN 28 (H) 8 - 23 mg/dL   Creatinine, Ser 1.50 (H) 0.44 - 1.00 mg/dL   Calcium 7.4 (L) 8.9 - 10.3 mg/dL   GFR calc non Af Amer 31 (L) >60 mL/min   GFR calc Af Amer 35 (L) >60 mL/min   Anion gap 10 5 - 15  Magnesium     Status: None   Collection Time: 08/19/18  5:56 AM  Result Value Ref Range   Magnesium 2.3 1.7 - 2.4 mg/dL  Lactic acid, plasma     Status: None   Collection Time: 08/19/18  5:56 AM  Result Value Ref Range   Lactic Acid, Venous 1.3 0.5 - 1.9 mmol/L    Studies/Results: No results found.    Assessment: C. difficile colitis  Plan:   Continue current treatment.    SAM F GANEM 08/19/2018, 10:30 AM  Pager: [phone] If no answer or after 5 PM call [phone]

## 2018-08-20 DIAGNOSIS — Z515 Encounter for palliative care: Secondary | ICD-10-CM

## 2018-08-20 DIAGNOSIS — Z66 Do not resuscitate: Secondary | ICD-10-CM

## 2018-08-20 MED ORDER — LORAZEPAM 2 MG/ML IJ SOLN
2.0000 mg | Freq: Four times a day (QID) | INTRAMUSCULAR | Status: DC
  Administered 2018-08-20 – 2018-08-24 (×18): 2 mg via INTRAVENOUS
  Filled 2018-08-20 (×18): qty 1

## 2018-08-20 MED ORDER — HYDROMORPHONE HCL 1 MG/ML IJ SOLN
1.0000 mg | INTRAMUSCULAR | Status: DC
  Administered 2018-08-20 – 2018-08-23 (×19): 1 mg via INTRAVENOUS
  Filled 2018-08-20 (×21): qty 1

## 2018-08-20 NOTE — Progress Notes (Signed)
PROGRESS NOTE  Anita Banks VXB:[id number] DOB: 08-25-1927 DOA: 08/31/2018 PCP: [name], MD  HPI/Recap of past 24 hours: No new c/o's  Assessment/Plan: Principal Problem:   C. difficile colitis Active Problems:   Dehydration   FTT (failure to thrive) in adult   HTN (hypertension)   AKI (acute kidney injury) ([place])   CKD (chronic kidney disease), stage III ([facility])   Hypokalemia   Hypothyroidism due to acquired atrophy of thyroid   HYPERCHOLESTEROLEMIA   Hyponatremia  Severe Cdiff colitis/spesis presented on admission, with fever, significant leukocytosis, tachypnea, pancolitis: new diagnosis for this patient, severe case and did not respond to po vanc, switched to Dificid 7/12, but patient was not doing well overall and family requested transition to comfort on 7/13 - continue comfort care, prn dilaudid and IV ativan - palliative consult pending today - DNR   AKI on CKDIII - no change  Hyponatremia -likely from dehydration  Metabolic acidosis: likely from diarrhea  HTN:   Hypothyroidism: Continue synthroid   Code Status: DNR  Family Communication: as above  Disposition Plan: comfort care , consulting palliative team   Kelly Splinter MD  pgr [phone] 08/20/2018, 9:23 AM    Consultants:  GI  ID  Procedures:  none  Antibiotics:  Oral vanc from admission to 7/12, deficid from 7/12  iv flagyl from 7/11   Objective: BP 99/62 (BP Location: Right Arm)   Pulse 74   Temp 98 F (36.7 C) (Oral)   Resp 20   Ht 5\' 3"  (1.6 m)   Wt 79 kg   SpO2 97%   BMI 30.85 kg/m   Intake/Output Summary (Last 24 hours) at 08/20/2018 0923 Last data filed at 08/19/2018 1508 Gross per 24 hour  Intake 653.69 ml  Output -  Net 653.69 ml   Filed Weights   08/18/18 0647 08/19/18 0518 08/20/18 0341  Weight: 76.6 kg 79.9 kg 79 kg    Exam:   General:  Still weak and frail, lethargic, daughter is at bedside  Cardiovascular: RRR  Respiratory:  CTABL  Abdomen: soft, no guarding, no rebound,  positive BS  Musculoskeletal: No Edema  Neuro: alert and  oriented x3  Data Reviewed: Basic Metabolic Panel: Recent Labs  Lab 08/17/2018 1633 08/16/18 0759 08/17/18 0602 08/18/18 0529 08/19/18 0556  NA 123* 128* 127* 128* 127*  K 3.8 3.8 3.4* 3.3* 3.9  CL 97* 100 101 102 102  CO2 16* 15* 16* 18* 15*  GLUCOSE 120* 123* 135* 91 144*  BUN 24* 17 17 22  28*  CREATININE 1.35* 1.15* 1.32* 1.32* 1.50*  CALCIUM 7.6* 7.7* 7.2* 7.4* 7.4*  MG 1.4*  --  1.8  --  2.3   Liver Function Tests: Recent Labs  Lab 08/14/2018 1633 08/16/18 0759  AST 17 19  ALT 16 16  ALKPHOS 56 62  BILITOT 0.5 0.5  PROT 6.4* 6.2*  ALBUMIN 3.0* 2.8*   Recent Labs  Lab 08/08/2018 1633  LIPASE 30   No results for input(s): AMMONIA in the last 168 hours. CBC: Recent Labs  Lab 08/27/2018 1633 08/16/18 0535 08/17/18 0602 08/18/18 0529 08/19/18 0556  WBC 21.7* 30.2* 22.4* 22.0* 25.6*  NEUTROABS 18.8*  --  18.6* 16.6* 18.5*  HGB 13.3 13.8 12.1 12.6 12.8  HCT 40.9 41.8 38.4 38.8 39.5  MCV 90.3 88.6 93.2 90.2 90.6  PLT 365 306 289 345 402*    [facility] Pager [phone]. If 7PM-7AM, please contact night-coverage at [email or web address], password [facility] 08/20/2018, 9:23 AM  LOS: 5 days

## 2018-08-20 NOTE — Progress Notes (Signed)
Not much more to add from a GI standpoint at this time. Call us if needed.

## 2018-08-20 NOTE — Consult Note (Signed)
Consultation Note Date: 08/20/2018   Patient Name: Anita Banks  DOB: [birth year]  MRN: [id number]  Age / Sex: 85 y.o., female  PCP: Plotnikov, Evie Lacks, MD Referring Physician: Roney Jaffe, MD  Reason for Consultation: Establishing goals of care and Psychosocial/spiritual support  HPI/Patient Profile: 85 y.o. female   admitted on 08/20/2018 with PMH  hypertension, anxiety, migraines, apparently presents with c/o diarrhea for the past 3 days.    CT abd/ pelvis IMPRESSION: 1. Marked edema of the colon throughout its course with pericolonic fluid and mesenteric edema. Findings are consistent with acute pancolitis, most likely infectious. 2. Small amount of ascites, extending into and RIGHT inguinal hernia. 3. Small hiatal hernia. 4. Aortic atherosclerosis. (ICD10-I70.0) 5. Coronary artery disease.  Pt admitted for C. Diff colitis, started on oral vancomycin in the ED for C. Diff colitis.     Patient had overall rapid physical, functional and cognitive decline in spite of medicial interventions.  Patient is minimally responsive has had no solid oral intake for 5 days and has only been tolerating sips for the past 24 hours.  Family has made decision to shift to a full comfort path.   Clinical Assessment and Goals of Care:   This NP Wadie Lessen reviewed medical records, received report from team, assessed the patient and then meet at the patient's bedside along with her daughter/Natashia  to discuss diagnosis, prognosis, GOC, EOL wishes disposition and options.  Concept of Hospice and Palliative Care were discussed  A detailed discussion was had today regarding advanced directives.  Concepts specific to code status, artifical feeding and hydration, continued IV antibiotics and rehospitalization was had.  The difference between a aggressive medical intervention path  and a palliative  comfort care path for this patient at this time was had.  Values and goals of care important to patient and family were attempted to be elicited.   Natural trajectory and expectations at EOL were discussed.  Questions and concerns addressed.   Family encouraged to call with questions or concerns.    PMT will continue to support holistically.    HCPOA    SUMMARY OF RECOMMENDATIONS    Code Status/Advance Care Planning:  DNR    Symptom Management:   Pain/dyspnea:  Dilaudid IV 1 mg every 6 hours scheduled and prn   Agitation: Ativan 2 mg every 6 hrs   Mouth care, turn and position   Palliative Prophylaxis:   Eye Care, Frequent Pain Assessment, Oral Care and Turn Reposition  Additional Recommendations (Limitations, Scope, Preferences):  Full Comfort Care  Psycho-social/Spiritual:   Desire for further Chaplaincy support:no  Additional Recommendations: Grief/Bereavement Support  Prognosis:   Hours - Days  Discharge Planning: Anticipated Hospital Death      Primary Diagnoses: Present on Admission: . C. difficile colitis . Dehydration . Hypothyroidism due to acquired atrophy of thyroid . HYPERCHOLESTEROLEMIA . HTN (hypertension) . AKI (acute kidney injury) ([place]) . CKD (chronic kidney disease), stage III ([place]) . FTT (failure to thrive) in adult . Hypokalemia   I  have reviewed the medical record, interviewed the patient and family, and examined the patient. The following aspects are pertinent.  Past Medical History:  Diagnosis Date  . Anxiety   . Arthritis   . Hypertension   . Migraine    Social History   Socioeconomic History  . Marital status: Widowed    Spouse name: Not on file  . Number of children: Not on file  . Years of education: Not on file  . Highest education level: Not on file  Occupational History  . Not on file  Social Needs  . Financial resource strain: Not on file  . Food insecurity    Worry: Not on file    Inability: Not on  file  . Transportation needs    Medical: Not on file    Non-medical: Not on file  Tobacco Use  . Smoking status: Never Smoker  . Smokeless tobacco: Never Used  Substance and Sexual Activity  . Alcohol use: Not on file  . Drug use: No  . Sexual activity: Not on file  Lifestyle  . Physical activity    Days per week: Not on file    Minutes per session: Not on file  . Stress: Not on file  Relationships  . Social Herbalist on phone: Not on file    Gets together: Not on file    Attends religious service: Not on file    Active member of club or organization: Not on file    Attends meetings of clubs or organizations: Not on file    Relationship status: Not on file  Other Topics Concern  . Not on file  Social History Narrative  . Not on file   History reviewed. No pertinent family history. Scheduled Meds: Continuous Infusions: PRN Meds:.acetaminophen **OR** acetaminophen, HYDROmorphone (DILAUDID) injection, ipratropium-albuterol, LORazepam, ondansetron (ZOFRAN) IV Medications Prior to Admission:  Prior to Admission medications   Medication Sig Start Date End Date Taking? Authorizing Provider  aspirin 81 MG tablet Take 81 mg by mouth daily.   Yes [provider]  clotrimazole-betamethasone (LOTRISONE) cream Apply 1 application topically 2 (two) times daily. 08/13/18 08/13/19 Yes Plotnikov, Evie Lacks, MD  estradiol (ESTRACE) 0.1 MG/GM vaginal cream Place 2 g vaginally See admin instructions. Every 2 days   Yes [provider]  Fremanezumab-vfrm (AJOVY) 225 MG/1.5ML SOSY Inject 225 mg into the skin every 30 (thirty) days.   Yes [provider]  glucosamine-chondroitin 500-400 MG tablet Take 1 tablet by mouth 2 (two) times daily. Patient taking differently: Take 1 tablet by mouth daily.  01/09/13  Yes Robyn Haber, MD  levothyroxine (SYNTHROID, LEVOTHROID) 112 MCG tablet Take 1 tablet (112 mcg total) by mouth daily. 04/22/18  Yes Plotnikov, Evie Lacks,  MD  lisinopril (PRINIVIL,ZESTRIL) 10 MG tablet TAKE 1 TABLET BY MOUTH IN THE MORNING AND 2 IN THE EVENING Patient taking differently: Take 10-20 mg by mouth See admin instructions. Take 20mg  by mouth in the morning and 10mg  by mouth in the evening 03/25/18  Yes Plotnikov, Evie Lacks, MD  LORazepam (ATIVAN) 1 MG tablet TAKE 1 TABLET AT BEDTIME MAY REPEAT 1 TIME IF NEEDED Patient taking differently: Take 1 mg by mouth at bedtime as needed for sleep.  04/22/18  Yes Plotnikov, Evie Lacks, MD  Omega-3 Fatty Acids (FISH OIL) 1200 MG CAPS Take 1 capsule (1,200 mg total) by mouth daily. 01/09/13  Yes Robyn Haber, MD  polyethylene glycol powder (GLYCOLAX/MIRALAX) powder MIX 1 SCOOP IN LIQUID AND TAKE  ONCE DAILY. 10/10/16  Yes Plotnikov, Evie Lacks, MD  propranolol (INDERAL) 10 MG tablet Take 1 tablet (10 mg total) by mouth every morning. 04/22/18  Yes Plotnikov, Evie Lacks, MD  propranolol (INDERAL) 20 MG tablet Take 1 tablet (20 mg total) by mouth 2 (two) times daily. 04/22/18  Yes Plotnikov, Evie Lacks, MD  propranolol (INDERAL) 60 MG tablet Take 60 mg by mouth daily.   Yes [provider]  propranolol ER (INDERAL LA) 60 MG 24 hr capsule Take 1 each by mouth daily. 07/14/18  Yes [provider]  topiramate (TOPAMAX) 50 MG tablet Take 50 mg by mouth daily.   Yes [provider]  Coenzyme Q10 (CO Q-10) 100 MG CAPS Take 1 tablet by mouth daily. Patient not taking: Reported on 08/11/2018 01/09/13   Robyn Haber, MD  diclofenac sodium (VOLTAREN) 1 % GEL Apply topically to affected area qid Patient not taking: Reported on 09/04/2018 06/27/17   Gerda Diss, DO  escitalopram (LEXAPRO) 5 MG tablet Take 1 tablet (5 mg total) by mouth daily. Patient not taking: Reported on 09/06/2018 04/22/18   Plotnikov, Evie Lacks, MD  metroNIDAZOLE (FLAGYL) 500 MG tablet Take 1 tablet (500 mg total) by mouth 2 (two) times daily. Patient not taking: Reported on 08/08/2018 07/30/18   Montine Circle, PA-C   mirtazapine (REMERON) 15 MG tablet Take 1 tablet (15 mg total) by mouth at bedtime. Patient not taking: Reported on 08/20/2018 04/22/18   Plotnikov, Evie Lacks, MD  Multiple Vitamins-Minerals (CENTRUM SILVER ADULT 50+) TABS Take 1 tablet by mouth daily. Patient not taking: Reported on 08/27/2018 01/09/13   Robyn Haber, MD  ondansetron (ZOFRAN ODT) 4 MG disintegrating tablet Take 1 tablet (4 mg total) by mouth every 8 (eight) hours as needed for nausea or vomiting. Patient not taking: Reported on 08/27/2018 07/30/18   Montine Circle, PA-C  triamcinolone cream (KENALOG) 0.1 % Apply 1 application topically 2 (two) times daily. Patient not taking: Reported on 08/31/2018 03/08/17   Plotnikov, Evie Lacks, MD   Allergies  Allergen Reactions  . Amitriptyline Hcl   . Azithromycin Hives    Per pt report - It is unclear if a little rash on the leg was related to Z pac or not.  . Doxycycline     nausea  . Erythromycin   . Metronidazole   . Penicillins     Can take keflex ok  . Rizatriptan Benzoate   . Tetracyclines & Related    Review of Systems  Unable to perform ROS: Acuity of condition    Physical Exam Constitutional:      Appearance: She is underweight.     Comments: Raquel Sarna responsive  Cardiovascular:     Rate and Rhythm: Normal rate and regular rhythm.     Heart sounds: Normal heart sounds.  Skin:    Comments: -Bilateral feet to shin cool and mottled  Neurological:     Mental Status: She is unresponsive.     Vital Signs: BP 99/62 (BP Location: Right Arm)   Pulse 74   Temp 98 F (36.7 C) (Oral)   Resp 20   Ht 5\' 3"  (1.6 m)   Wt 79 kg   SpO2 97%   BMI 30.85 kg/m  Pain Scale: 0-10 POSS *See Group Information*: 2-Acceptable,Slightly drowsy, easily aroused Pain Score: Asleep   SpO2: SpO2: 97 % O2 Device:SpO2: 97 % O2 Flow Rate: .   IO: Intake/output summary:   Intake/Output Summary (Last 24 hours) at 08/20/2018 0902 Last data  filed at 08/19/2018 1508 Gross per 24 hour   Intake 653.69 ml  Output -  Net 653.69 ml    LBM: Last BM Date: 08/19/18 Baseline Weight: Weight: 73.6 kg Most recent weight: Weight: 79 kg     Palliative Assessment/Data: 30% at best   Discussed with Dr Jonnie Finner  Time In: 0340 Time Out: 1200 Time Total: 75 minutes Greater than 50%  of this time was spent counseling and coordinating care related to the above assessment and plan.  Signed by: Wadie Lessen, NP   Please contact Palliative Medicine Team phone at [phone] for questions and concerns.  For individual provider: See Shea Evans

## 2018-08-21 DIAGNOSIS — R52 Pain, unspecified: Secondary | ICD-10-CM

## 2018-08-21 DIAGNOSIS — Z515 Encounter for palliative care: Secondary | ICD-10-CM

## 2018-08-21 DIAGNOSIS — Z66 Do not resuscitate: Secondary | ICD-10-CM

## 2018-08-21 LAB — CULTURE, BLOOD (ROUTINE X 2)
Culture: NO GROWTH
Culture: NO GROWTH

## 2018-08-21 MED ORDER — ZINC OXIDE 40 % EX OINT
TOPICAL_OINTMENT | Freq: Two times a day (BID) | CUTANEOUS | Status: DC
  Administered 2018-08-21 – 2018-08-22 (×2): via TOPICAL
  Administered 2018-08-22: 1 via TOPICAL
  Administered 2018-08-23 – 2018-08-24 (×4): via TOPICAL
  Filled 2018-08-21 (×2): qty 57

## 2018-08-21 NOTE — Progress Notes (Signed)
Patient ID: Anita Banks, female   DOB: 10-03-1927, 85 y.o.   MRN: [id number]  This NP visited patient at the bedside as a follow up to  yesterday's [place].    Daughter and her husband at bedside being vigil.  Family are anticipating preparing for patient's death.  They are at peace with the full comfort path focusing on comfort and dignity.  Patient remains unresponsive, no oral intake, extremities are cool.  Patient is requiring scheduled and as needed IV Dilaudid for symptom management.  As discussed with attending and myself yesterday prognosis remains hours to days.  Anticipate hospital death.  Discussed with family the natural trajectory and expectations at end of life.  Created space and opportunity for family at the bedside to explore their thoughts, feelings and grief.  Emotional support offered.  Questions and concerns addressed   Discussed with Dr Florene Glen  Total time spent on the unit was 35 minutes   PMT will continue to support holistically   Greater than 50% of the time was spent in counseling and coordination of care  Wadie Lessen NP  Palliative Medicine Team Team Phone # [phone] Pager [phone]

## 2018-08-21 NOTE — Progress Notes (Signed)
PROGRESS NOTE    Anita Banks  QPY:[id number] DOB: 11-Dec-1927 DOA: 08/17/2018 PCP: [name], MD   Brief Narrative: Anita Banks  is a 85 y.o. female, w hypertension, anxiety, migraines, who was admitted for C diff colitis.  Failed PO vanc and now has been transitioned to comfort care.  Assessment & Plan:   Principal Problem:   C. difficile colitis Active Problems:   Hypothyroidism due to acquired atrophy of thyroid   HYPERCHOLESTEROLEMIA   HTN (hypertension)   Dehydration   Hyponatremia   AKI (acute kidney injury) ([place])   CKD (chronic kidney disease), stage III ([facility])   FTT (failure to thrive) in adult   Hypokalemia   Palliative care by specialist   DNR (do not resuscitate)   Pain, generalized  Comfort Care:  Pt currently appears comfortable.  Appreciate palliative care assistance.  She's unresponsive with no oral intake.  At this point in time, anticipate in hospital death with prognosis of hours to days. Continue comfort measures  Severe Cdiff colitis/spesis presented on admission, with fever, significant leukocytosis, tachypnea, pancolitis: new diagnosis for this patient, severe case and did not respond to po vanc, switched to Dificid 7/12, but patient was not doing well overall and family requested transition to comfort on 7/13 - continue comfort care, prn dilaudid and IV ativan - palliative consult, appreciate recs - DNR  AKI on CKDIII - no change  Hyponatremia -likely from dehydration  Metabolic acidosis: likely from diarrhea  HTN:   Hypothyroidism: Continue synthroid  DVT prophylaxis: comfort care Code Status: DNR Family Communication: daughter Disposition Plan: anticipate in hospital death   Consultants:   GI  ID  Palliative  Procedures:   none  Antimicrobials:  Anti-infectives (From admission, onward)   Start     Dose/Rate Route Frequency Ordered Stop   08/18/18 1500  fidaxomicin (DIFICID) tablet 200 mg   Status:  Discontinued     200 mg Oral 2 times daily 08/18/18 1401 08/19/18 1151   08/18/18 1415  fidaxomicin (DIFICID) tablet 200 mg  Status:  Discontinued     200 mg Oral 2 times daily 08/18/18 1401 08/18/18 1403   08/17/18 1800  vancomycin (VANCOCIN) 50 mg/mL oral solution 125 mg  Status:  Discontinued     125 mg Oral 4 times daily 08/17/18 1405 08/18/18 1359   08/16/18 1800  vancomycin (VANCOCIN) 50 mg/mL oral solution 500 mg  Status:  Discontinued     500 mg Oral 4 times daily 08/16/18 1519 08/17/18 1350   08/16/18 1600  metroNIDAZOLE (FLAGYL) IVPB 500 mg  Status:  Discontinued    Note to Pharmacy: Daughter reports patient took flagyl recently without issues.   500 mg 100 mL/hr over 60 Minutes Intravenous Every 8 hours 08/16/18 1523 08/19/18 1151   08/19/2018 2200  vancomycin (VANCOCIN) 50 mg/mL oral solution 125 mg  Status:  Discontinued     125 mg Oral 4 times daily  2130 08/16/18 1519     Subjective: Daughter and husband at bedside Ask me if there's potential for her to get better - thought they noticed warmer limbs last night, saw some movement with placement of IV Discussed it was my first day seeing her, but based on review of chart and her unresponsiveness and no PO, comfort care measures continues to be appropriate.  Objective: Vitals:   08/20/18 0341 08/20/18 1204 08/20/18 2110 08/21/18 0415  BP: 99/62  (!) 75/45 (!) 73/47  Pulse: 74  75 83  Resp: 20 (!) 8 (!)  5 (!) 6  Temp: 98 F (36.7 C)  98 F (36.7 C)   TempSrc: Oral  Oral   SpO2: 97%  93% (!) 88%  Weight: 79 kg     Height:       No intake or output data in the 24 hours ending 08/21/18 1130 Filed Weights   08/18/18 0647 08/19/18 0518 08/20/18 0341  Weight: 76.6 kg 79.9 kg 79 kg    Examination:  General exam: Appears comfortable, unresponsive, groans when being turned by nurses, but doesn't open eyes or move significantly Respiratory system: Clear to auscultation. Respiratory effort normal.  Cardiovascular system: S1 & S2 heard, RRR.  Gastrointestinal system: Abdomen is nondistended, soft and nontender.  Central nervous system: unresponsive to voice, groans when being turned by nursing Extremities: no LEE  Data Reviewed: I have personally reviewed following labs and imaging studies  CBC: Recent Labs  Lab 08/09/2018 1633 08/16/18 0535 08/17/18 0602 08/18/18 0529 08/19/18 0556  WBC 21.7* 30.2* 22.4* 22.0* 25.6*  NEUTROABS 18.8*  --  18.6* 16.6* 18.5*  HGB 13.3 13.8 12.1 12.6 12.8  HCT 40.9 41.8 38.4 38.8 39.5  MCV 90.3 88.6 93.2 90.2 90.6  PLT 365 306 289 345 779*   Basic Metabolic Panel: Recent Labs  Lab 08/17/2018 1633 08/16/18 0759 08/17/18 0602 08/18/18 0529 08/19/18 0556  NA 123* 128* 127* 128* 127*  K 3.8 3.8 3.4* 3.3* 3.9  CL 97* 100 101 102 102  CO2 16* 15* 16* 18* 15*  GLUCOSE 120* 123* 135* 91 144*  BUN 24* 17 17 22  28*  CREATININE 1.35* 1.15* 1.32* 1.32* 1.50*  CALCIUM 7.6* 7.7* 7.2* 7.4* 7.4*  MG 1.4*  --  1.8  --  2.3   GFR: Estimated Creatinine Clearance: 25.3 mL/min (A) (by C-G formula based on SCr of 1.5 mg/dL (H)). Liver Function Tests: Recent Labs  Lab 08/26/2018 1633 08/16/18 0759  AST 17 19  ALT 16 16  ALKPHOS 56 62  BILITOT 0.5 0.5  PROT 6.4* 6.2*  ALBUMIN 3.0* 2.8*   Recent Labs  Lab 08/23/2018 1633  LIPASE 30   No results for input(s): AMMONIA in the last 168 hours. Coagulation Profile: No results for input(s): INR, PROTIME in the last 168 hours. Cardiac Enzymes: No results for input(s): CKTOTAL, CKMB, CKMBINDEX, TROPONINI in the last 168 hours. BNP (last 3 results) No results for input(s): PROBNP in the last 8760 hours. HbA1C: No results for input(s): HGBA1C in the last 72 hours. CBG: Recent Labs  Lab 08/16/18 0730  GLUCAP 117*   Lipid Profile: No results for input(s): CHOL, HDL, LDLCALC, TRIG, CHOLHDL, LDLDIRECT in the last 72 hours. Thyroid Function Tests: No results for input(s): TSH, T4TOTAL, FREET4, T3FREE,  THYROIDAB in the last 72 hours. Anemia Panel: No results for input(s): VITAMINB12, FOLATE, FERRITIN, TIBC, IRON, RETICCTPCT in the last 72 hours. Sepsis Labs: Recent Labs  Lab 08/17/18 0602 08/18/18 0529 08/19/18 0556  LATICACIDVEN 1.9 1.1 1.3    Recent Results (from the past 240 hour(s))  Clostridium difficile EIA     Status: Abnormal   Collection Time: 08/14/18 12:00 AM   Specimen: Stool   STOOL  Result Value Ref Range Status   C difficile Toxins A+B, EIA Positive (A) Negative Final  Gastrointestinal Panel by PCR , Stool     Status: None   Collection Time: 08/20/2018  8:31 PM   Specimen: Stool  Result Value Ref Range Status   Campylobacter species NOT DETECTED NOT DETECTED Final   Plesimonas  shigelloides NOT DETECTED NOT DETECTED Final   Salmonella species NOT DETECTED NOT DETECTED Final   Yersinia enterocolitica NOT DETECTED NOT DETECTED Final   Vibrio species NOT DETECTED NOT DETECTED Final   Vibrio cholerae NOT DETECTED NOT DETECTED Final   Enteroaggregative E coli (EAEC) NOT DETECTED NOT DETECTED Final   Enteropathogenic E coli (EPEC) NOT DETECTED NOT DETECTED Final   Enterotoxigenic E coli (ETEC) NOT DETECTED NOT DETECTED Final   Shiga like toxin producing E coli (STEC) NOT DETECTED NOT DETECTED Final   Shigella/Enteroinvasive E coli (EIEC) NOT DETECTED NOT DETECTED Final   Cryptosporidium NOT DETECTED NOT DETECTED Final   Cyclospora cayetanensis NOT DETECTED NOT DETECTED Final   Entamoeba histolytica NOT DETECTED NOT DETECTED Final   Giardia lamblia NOT DETECTED NOT DETECTED Final   Adenovirus F40/41 NOT DETECTED NOT DETECTED Final   Astrovirus NOT DETECTED NOT DETECTED Final   Norovirus GI/GII NOT DETECTED NOT DETECTED Final   Rotavirus A NOT DETECTED NOT DETECTED Final   Sapovirus (I, II, IV, and V) NOT DETECTED NOT DETECTED Final    Comment: Performed at [facility], [address] [place], [place] [address]  C Difficile Quick Screen w PCR reflex      Status: Abnormal   Collection Time: 09/01/2018  8:31 PM   Specimen: Stool  Result Value Ref Range Status   C Diff antigen POSITIVE (A) NEGATIVE Final   C Diff toxin POSITIVE (A) NEGATIVE Final   C Diff interpretation Toxin producing C. difficile detected.  Final    Comment: CRITICAL RESULT CALLED TO, READ BACK BY AND VERIFIED WITHAlfonso Patten GROVES RN 2124 08/23/2018 A NAVARRO Performed at [facility], [place] [address] [place], [place] [address]   SARS Coronavirus 2 (CEPHEID - Performed in [facility] hospital lab), Hosp Order     Status: None   Collection Time: 08/13/2018  9:23 PM   Specimen: Nasopharyngeal Swab  Result Value Ref Range Status   SARS Coronavirus 2 NEGATIVE NEGATIVE Final    Comment: (NOTE) If result is NEGATIVE SARS-CoV-2 target nucleic acids are NOT DETECTED. The SARS-CoV-2 RNA is generally detectable in upper and lower  respiratory specimens during the acute phase of infection. The lowest  concentration of SARS-CoV-2 viral copies this assay can detect is 250  copies / mL. A negative result does not preclude SARS-CoV-2 infection  and should not be used as the sole basis for treatment or other  patient management decisions.  A negative result may occur with  improper specimen collection / handling, submission of specimen other  than nasopharyngeal swab, presence of viral mutation(s) within the  areas targeted by this assay, and inadequate number of viral copies  (<250 copies / mL). A negative result must be combined with clinical  observations, patient history, and epidemiological information. If result is POSITIVE SARS-CoV-2 target nucleic acids are DETECTED. The SARS-CoV-2 RNA is generally detectable in upper and lower  respiratory specimens dur ing the acute phase of infection.  Positive  results are indicative of active infection with SARS-CoV-2.  Clinical  correlation with patient history and other diagnostic information is  necessary to determine  patient infection status.  Positive results do  not rule out bacterial infection or co-infection with other viruses. If result is PRESUMPTIVE POSTIVE SARS-CoV-2 nucleic acids MAY BE PRESENT.   A presumptive positive result was obtained on the submitted specimen  and confirmed on repeat testing.  While 2019 novel coronavirus  (SARS-CoV-2) nucleic acids may be present in  the submitted sample  additional confirmatory testing may be necessary for epidemiological  and / or clinical management purposes  to differentiate between  SARS-CoV-2 and other Sarbecovirus currently known to infect humans.  If clinically indicated additional testing with an alternate test  methodology [phone]) is advised. The SARS-CoV-2 RNA is generally  detectable in upper and lower respiratory sp ecimens during the acute  phase of infection. The expected result is Negative. Fact Sheet for Patients:  StrictlyIdeas.no Fact Sheet for Healthcare Providers: BankingDealers.co.za This test is not yet approved or cleared by the [place] FDA and has been authorized for detection and/or diagnosis of SARS-CoV-2 by FDA under an Emergency Use Authorization (EUA).  This EUA will remain in effect (meaning this test can be used) for the duration of the COVID-19 declaration under Section 564(b)(1) of the Act, 21 U.S.C. section 360bbb-3(b)(1), unless the authorization is terminated or revoked sooner. Performed at [facility], [place] [address] [place], [place] [address]   Culture, blood (routine x 2)     Status: None (Preliminary result)   Collection Time: 08/16/18 12:53 PM   Specimen: BLOOD  Result Value Ref Range Status   Specimen Description   Final    BLOOD RIGHT HAND Performed at [facility] [address] [place], [place] [address]    Special Requests   Final    BOTTLES DRAWN AEROBIC ONLY Blood Culture results may not be optimal  due to an inadequate volume of blood received in culture bottles Performed at [facility] [address] [place], [place] [address]    Culture   Final    NO GROWTH 4 DAYS Performed at [facility], [place] [address] [place], [place] [address]    Report Status PENDING  Incomplete  Culture, blood (routine x 2)     Status: None (Preliminary result)   Collection Time: 08/16/18  1:07 PM   Specimen: BLOOD  Result Value Ref Range Status   Specimen Description   Final    BLOOD LEFT HAND Performed at [facility] [address], [place], [place] [address]    Special Requests   Final    BOTTLES DRAWN AEROBIC ONLY Blood Culture results may not be optimal due to an inadequate volume of blood received in culture bottles Performed at [facility] [address], [place], [place] [address]    Culture   Final    NO GROWTH 4 DAYS Performed at [facility], [place] [address], [place], [place] [address]    Report Status PENDING  Incomplete         Radiology Studies: No results found.      Scheduled Meds: .  HYDROmorphone (DILAUDID) injection  1 mg Intravenous Q4H  . LORazepam  2 mg Intravenous Q6H   Continuous Infusions:   LOS: 6 days    Time spent: over 30 min    Fayrene Helper, MD [facility] Pager AMION  If 7PM-7AM, please contact night-coverage [email or web address] Password [email or web address] 08/21/2018, 11:30 AM

## 2018-08-21 NOTE — Plan of Care (Signed)

## 2018-08-21 NOTE — Progress Notes (Signed)
Actively dying °

## 2018-08-22 MED ORDER — LORAZEPAM 2 MG/ML IJ SOLN: 1.0000 mg | Freq: Once | INTRAMUSCULAR | Status: DC | PRN

## 2018-08-22 NOTE — Progress Notes (Signed)
Patient with no change overnight; still unresponsive but appears comfortable. Staff have continued comfort measures and scheduled Ativan and Dilaudid. Patient had 3 very small liquid stools overnight but no urine output. Will continue to monitor patient.

## 2018-08-22 NOTE — Progress Notes (Signed)
PROGRESS NOTE    Anita Banks  LZJ:[id number] DOB: [birth year] DOA: 08/23/2018 PCP: [name], MD   Brief Narrative: Anita Banks  is a 85 y.o. female, w hypertension, anxiety, migraines, who was admitted for C diff colitis.  Failed PO vanc and now has been transitioned to comfort care.  Assessment & Plan:   Principal Problem:   C. difficile colitis Active Problems:   Hypothyroidism due to acquired atrophy of thyroid   HYPERCHOLESTEROLEMIA   HTN (hypertension)   Dehydration   Hyponatremia   AKI (acute kidney injury) ([place])   CKD (chronic kidney disease), stage III (HCC)   FTT (failure to thrive) in adult   Hypokalemia   Palliative care by specialist   DNR (do not resuscitate)   Pain, generalized  Comfort Care:  Pt currently appears comfortable.  Appreciate palliative care assistance.  She's unresponsive with no oral intake.  At this point in time, I continue to anticipate in hospital death with prognosis of hours to days. Continue comfort measures  Severe Cdiff colitis/spesis presented on admission, with fever, significant leukocytosis, tachypnea, pancolitis: new diagnosis for this patient, severe case and did not respond to po vanc, switched to Dificid 7/12, but patient was not doing well overall and family requested transition to comfort on 7/13 - continue comfort care, prn dilaudid and IV ativan - palliative consult, appreciate recs - DNR  AKI on CKDIII - no change  Hyponatremia -likely from dehydration  Metabolic acidosis: likely from diarrhea  HTN:   Hypothyroidism: Continue synthroid  DVT prophylaxis: comfort care Code Status: DNR Family Communication: daughter Disposition Plan: anticipate in hospital death   Consultants:   GI  ID  Palliative  Procedures:   none  Antimicrobials:  Anti-infectives (From admission, onward)   Start     Dose/Rate Route Frequency Ordered Stop   08/18/18 1500  fidaxomicin (DIFICID) tablet  200 mg  Status:  Discontinued     200 mg Oral 2 times daily 08/18/18 1401 08/19/18 1151   08/18/18 1415  fidaxomicin (DIFICID) tablet 200 mg  Status:  Discontinued     200 mg Oral 2 times daily 08/18/18 1401 08/18/18 1403   08/17/18 1800  vancomycin (VANCOCIN) 50 mg/mL oral solution 125 mg  Status:  Discontinued     125 mg Oral 4 times daily 08/17/18 1405 08/18/18 1359   08/16/18 1800  vancomycin (VANCOCIN) 50 mg/mL oral solution 500 mg  Status:  Discontinued     500 mg Oral 4 times daily 08/16/18 1519 08/17/18 1350   08/16/18 1600  metroNIDAZOLE (FLAGYL) IVPB 500 mg  Status:  Discontinued    Note to Pharmacy: Daughter reports patient took flagyl recently without issues.   500 mg 100 mL/hr over 60 Minutes Intravenous Every 8 hours 08/16/18 1523 08/19/18 1151   08/18/2018 2200  vancomycin (VANCOCIN) 50 mg/mL oral solution 125 mg  Status:  Discontinued     125 mg Oral 4 times daily 08/29/2018 2130 08/16/18 1519     Subjective: Discussed with daughter's husband this morning. He's asking if it is possible that any of this could be reversible. Had long discussion.  Discussed that at this time, I think that keeping pt comfortable is appropriate and the right decision.  She's hypotensive, unresponsive, and I think attempts to reverse current process would prolong suffering.  She would need to go to the ICU at this time if that were the case.  He expressed understanding.  I repeated that I believe this is the right  decision.  Objective: Vitals:   08/21/18 2032 08/21/18 2317 08/22/18 0705 08/22/18 0825  BP:   (!) 61/38   Pulse:   92   Resp: (!) 7 (!) 7  (!) 7  Temp:      TempSrc:      SpO2:      Weight:      Height:        Intake/Output Summary (Last 24 hours) at 08/22/2018 1532 Last data filed at 08/21/2018 1859 Gross per 24 hour  Intake 0 ml  Output -  Net 0 ml   Filed Weights   08/18/18 0647 08/19/18 0518 08/20/18 0341  Weight: 76.6 kg 79.9 kg 79 kg    Examination:  General: No  acute distress.  Appears comfortable Lungs: slow, labored breaths Abdomen: Soft, nontender, nondistended Neurological: unresponsive, does not awaken to voice or tactile stimuli (though per nursing she was groaning earlier when turned and prior to this). Extremities: no LEE   Data Reviewed: I have personally reviewed following labs and imaging studies  CBC: Recent Labs  Lab 08/20/2018 1633 08/16/18 0535 08/17/18 0602 08/18/18 0529 08/19/18 0556  WBC 21.7* 30.2* 22.4* 22.0* 25.6*  NEUTROABS 18.8*  --  18.6* 16.6* 18.5*  HGB 13.3 13.8 12.1 12.6 12.8  HCT 40.9 41.8 38.4 38.8 39.5  MCV 90.3 88.6 93.2 90.2 90.6  PLT 365 306 289 345 841*   Basic Metabolic Panel: Recent Labs  Lab 08/30/2018 1633 08/16/18 0759 08/17/18 0602 08/18/18 0529 08/19/18 0556  NA 123* 128* 127* 128* 127*  K 3.8 3.8 3.4* 3.3* 3.9  CL 97* 100 101 102 102  CO2 16* 15* 16* 18* 15*  GLUCOSE 120* 123* 135* 91 144*  BUN 24* 17 17 22  28*  CREATININE 1.35* 1.15* 1.32* 1.32* 1.50*  CALCIUM 7.6* 7.7* 7.2* 7.4* 7.4*  MG 1.4*  --  1.8  --  2.3   GFR: Estimated Creatinine Clearance: 25.3 mL/min (A) (by C-G formula based on SCr of 1.5 mg/dL (H)). Liver Function Tests: Recent Labs  Lab 08/07/2018 1633 08/16/18 0759  AST 17 19  ALT 16 16  ALKPHOS 56 62  BILITOT 0.5 0.5  PROT 6.4* 6.2*  ALBUMIN 3.0* 2.8*   Recent Labs  Lab 08/18/2018 1633  LIPASE 30   No results for input(s): AMMONIA in the last 168 hours. Coagulation Profile: No results for input(s): INR, PROTIME in the last 168 hours. Cardiac Enzymes: No results for input(s): CKTOTAL, CKMB, CKMBINDEX, TROPONINI in the last 168 hours. BNP (last 3 results) No results for input(s): PROBNP in the last 8760 hours. HbA1C: No results for input(s): HGBA1C in the last 72 hours. CBG: Recent Labs  Lab 08/16/18 0730  GLUCAP 117*   Lipid Profile: No results for input(s): CHOL, HDL, LDLCALC, TRIG, CHOLHDL, LDLDIRECT in the last 72 hours. Thyroid Function  Tests: No results for input(s): TSH, T4TOTAL, FREET4, T3FREE, THYROIDAB in the last 72 hours. Anemia Panel: No results for input(s): VITAMINB12, FOLATE, FERRITIN, TIBC, IRON, RETICCTPCT in the last 72 hours. Sepsis Labs: Recent Labs  Lab 08/17/18 0602 08/18/18 0529 08/19/18 0556  LATICACIDVEN 1.9 1.1 1.3    Recent Results (from the past 240 hour(s))  Clostridium difficile EIA     Status: Abnormal   Collection Time: 08/14/18 12:00 AM   Specimen: Stool   STOOL  Result Value Ref Range Status   C difficile Toxins A+B, EIA Positive (A) Negative Final  Gastrointestinal Panel by PCR , Stool     Status: None  Collection Time: 08/13/2018  8:31 PM   Specimen: Stool  Result Value Ref Range Status   Campylobacter species NOT DETECTED NOT DETECTED Final   Plesimonas shigelloides NOT DETECTED NOT DETECTED Final   Salmonella species NOT DETECTED NOT DETECTED Final   Yersinia enterocolitica NOT DETECTED NOT DETECTED Final   Vibrio species NOT DETECTED NOT DETECTED Final   Vibrio cholerae NOT DETECTED NOT DETECTED Final   Enteroaggregative E coli (EAEC) NOT DETECTED NOT DETECTED Final   Enteropathogenic E coli (EPEC) NOT DETECTED NOT DETECTED Final   Enterotoxigenic E coli (ETEC) NOT DETECTED NOT DETECTED Final   Shiga like toxin producing E coli (STEC) NOT DETECTED NOT DETECTED Final   Shigella/Enteroinvasive E coli (EIEC) NOT DETECTED NOT DETECTED Final   Cryptosporidium NOT DETECTED NOT DETECTED Final   Cyclospora cayetanensis NOT DETECTED NOT DETECTED Final   Entamoeba histolytica NOT DETECTED NOT DETECTED Final   Giardia lamblia NOT DETECTED NOT DETECTED Final   Adenovirus F40/41 NOT DETECTED NOT DETECTED Final   Astrovirus NOT DETECTED NOT DETECTED Final   Norovirus GI/GII NOT DETECTED NOT DETECTED Final   Rotavirus A NOT DETECTED NOT DETECTED Final   Sapovirus (I, II, IV, and V) NOT DETECTED NOT DETECTED Final    Comment: Performed at [facility], [address]  [place], [place] [address]  C Difficile Quick Screen w PCR reflex     Status: Abnormal   Collection Time: 08/10/2018  8:31 PM   Specimen: Stool  Result Value Ref Range Status   C Diff antigen POSITIVE (A) NEGATIVE Final   C Diff toxin POSITIVE (A) NEGATIVE Final   C Diff interpretation Toxin producing C. difficile detected.  Final    Comment: CRITICAL RESULT CALLED TO, READ BACK BY AND VERIFIED WITHAlfonso Patten GROVES RN 2124 08/16/2018 A NAVARRO Performed at [facility], [place] [address] [place], [place] [address]   SARS Coronavirus 2 (CEPHEID - Performed in [place] hospital lab), Hosp Order     Status: None   Collection Time: 08/09/2018  9:23 PM   Specimen: Nasopharyngeal Swab  Result Value Ref Range Status   SARS Coronavirus 2 NEGATIVE NEGATIVE Final    Comment: (NOTE) If result is NEGATIVE SARS-CoV-2 target nucleic acids are NOT DETECTED. The SARS-CoV-2 RNA is generally detectable in upper and lower  respiratory specimens during the acute phase of infection. The lowest  concentration of SARS-CoV-2 viral copies this assay can detect is 250  copies / mL. A negative result does not preclude SARS-CoV-2 infection  and should not be used as the sole basis for treatment or other  patient management decisions.  A negative result may occur with  improper specimen collection / handling, submission of specimen other  than nasopharyngeal swab, presence of viral mutation(s) within the  areas targeted by this assay, and inadequate number of viral copies  (<250 copies / mL). A negative result must be combined with clinical  observations, patient history, and epidemiological information. If result is POSITIVE SARS-CoV-2 target nucleic acids are DETECTED. The SARS-CoV-2 RNA is generally detectable in upper and lower  respiratory specimens dur ing the acute phase of infection.  Positive  results are indicative of active infection with SARS-CoV-2.  Clinical  correlation with patient history  and other diagnostic information is  necessary to determine patient infection status.  Positive results do  not rule out bacterial infection or co-infection with other viruses. If result is PRESUMPTIVE POSTIVE SARS-CoV-2 nucleic acids MAY BE PRESENT.   A  presumptive positive result was obtained on the submitted specimen  and confirmed on repeat testing.  While 2019 novel coronavirus  (SARS-CoV-2) nucleic acids may be present in the submitted sample  additional confirmatory testing may be necessary for epidemiological  and / or clinical management purposes  to differentiate between  SARS-CoV-2 and other Sarbecovirus currently known to infect humans.  If clinically indicated additional testing with an alternate test  methodology [phone]) is advised. The SARS-CoV-2 RNA is generally  detectable in upper and lower respiratory sp ecimens during the acute  phase of infection. The expected result is Negative. Fact Sheet for Patients:  StrictlyIdeas.no Fact Sheet for Healthcare Providers: BankingDealers.co.za This test is not yet approved or cleared by the [place] FDA and has been authorized for detection and/or diagnosis of SARS-CoV-2 by FDA under an Emergency Use Authorization (EUA).  This EUA will remain in effect (meaning this test can be used) for the duration of the COVID-19 declaration under Section 564(b)(1) of the Act, 21 U.S.C. section 360bbb-3(b)(1), unless the authorization is terminated or revoked sooner. Performed at [facility], [place] [address] [place], [place] [address]   Culture, blood (routine x 2)     Status: None   Collection Time: 08/16/18 12:53 PM   Specimen: BLOOD  Result Value Ref Range Status   Specimen Description   Final    BLOOD RIGHT HAND Performed at [facility] [address], [place], [place] [address]    Special Requests   Final    BOTTLES DRAWN AEROBIC ONLY  Blood Culture results may not be optimal due to an inadequate volume of blood received in culture bottles Performed at [facility] [address], [place], [place] [address]    Culture   Final    NO GROWTH 5 DAYS Performed at [facility], [place] [address] [place], [place] [address]    Report Status 08/21/2018 FINAL  Final  Culture, blood (routine x 2)     Status: None   Collection Time: 08/16/18  1:07 PM   Specimen: BLOOD  Result Value Ref Range Status   Specimen Description   Final    BLOOD LEFT HAND Performed at [facility] [address], [place], [place] [address]    Special Requests   Final    BOTTLES DRAWN AEROBIC ONLY Blood Culture results may not be optimal due to an inadequate volume of blood received in culture bottles Performed at [facility] [address] [place], [place] [address]    Culture   Final    NO GROWTH 5 DAYS Performed at [facility], [place] [address], [place], [place] [address]    Report Status 08/21/2018 FINAL  Final         Radiology Studies: No results found.      Scheduled Meds: .  HYDROmorphone (DILAUDID) injection  1 mg Intravenous Q4H  . liver oil-zinc oxide   Topical BID  . LORazepam  2 mg Intravenous Q6H   Continuous Infusions:   LOS: 7 days    Time spent: over 30 min    Fayrene Helper, MD [facility] Pager AMION  If 7PM-7AM, please contact night-coverage [email or web address] Password [facility] 08/22/2018, 3:32 PM

## 2018-08-22 NOTE — Care Management Important Message (Signed)
Important Message  Patient Details IM Letter given to Anita McGibboney RN to present to the Patient Name: Anita Banks MRN: [id number] Date of Birth: 15-Nov-1927   Medicare Important Message Given:  Yes     [name] 08/22/2018, 11:25 AM

## 2018-08-22 NOTE — Progress Notes (Signed)
Patient ID: CHARELL FAULK, female   DOB: 07-08-1927, 85 y.o.   MRN: [id number]  This NP visited patient at the bedside as a follow up for palliative medicine needs and emotional support.  Patient is unresponsive but appears comfortable.  Focus of care is comfort quality and dignity.  Patient is  receiving scheduled,  and as needed Dilaudid for symptom management.   Focus of care is comfort quality and dignity.Patient remains unresponsive, no oral intake, extremities are cool.  Prognosis is likely days.  Son in Sports coach  at bedside.  He verbalizes his surprise that the patient " is still with [place]", family thought that the patient's end of life was much more Administrator, arts.  Although the thought crossed his mind that perhaps she really was not dying he understands that indeed time is limited and is comfortable with the focus of a full comfort path..  We discussed the natural trajectory and expectations at end of life.  We discussed the inability to predict the actual timing of death.  We discussed that it is a process.   Anticipate hospital death.  Created space and opportunity for family at the bedside to explore their thoughts, feelings regarding death and the dying process. Emotional support offered.  Questions and concerns addressed   Discussed with Dr Florene Glen  Total time spent on the unit was 35 minutes   PMT will continue to support holistically   Greater than 50% of the time was spent in counseling and coordination of care  Wadie Lessen NP  Palliative Medicine Team Team Phone # [phone] Pager [phone]

## 2018-08-23 DIAGNOSIS — Z7189 Other specified counseling: Secondary | ICD-10-CM

## 2018-08-23 MED ORDER — SODIUM CHLORIDE 0.9 % IV SOLN
1.0000 mg/h | INTRAVENOUS | Status: DC
  Administered 2018-08-23: 0.5 mg/h via INTRAVENOUS
  Administered 2018-08-24: 1 mg/h via INTRAVENOUS
  Filled 2018-08-23 (×2): qty 2.5

## 2018-08-23 MED ORDER — HYDROMORPHONE BOLUS VIA INFUSION
1.0000 mg | INTRAVENOUS | Status: DC | PRN
  Administered 2018-08-24 (×2): 1 mg via INTRAVENOUS
  Filled 2018-08-23: qty 1

## 2018-08-23 NOTE — Progress Notes (Signed)
**Note De-Identified vi Obfusction** PROGRESS NOTE    Anita Banks  QTM:[id number] DOB: September 27, 1927 DOA: 08/10/2018 PCP: Cssndri Anger, MD   Brief Nrrtive: Anita Banks  is  85 y.o. femle, w hypertension, nxiety, migrines, who ws dmitted for C diff colitis.  Filed PO vnc nd now hs been trnsitioned to comfort cre.  Assessment & Pln:   Principl Problem:   C. difficile colitis Active Problems:   Hypothyroidism due to cquired trophy of thyroid   HYPERCHOLESTEROLEMIA   HTN (hypertension)   Dehydrtion   Hypontremi   AKI (cute kidney injury) ([place])   CKD (chronic kidney disese), stge III (HCC)   FTT (filure to thrive) in dult   Hypoklemi   Pllitive cre by specilist   DNR (do not resuscitte)   Pin, generlized  Comfort Cre:  Pt currently ppers comfortble.  Apprecite pllitive cre ssistnce.  She's unresponsive with no orl intke.  At this point in time, I continue to nticipte in hospitl deth with prognosis of hours to dys. 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  HTN:   Hypothyroidism: Continue synthroid  DVT prophylxis: comfort cre Code Sttus: DNR Fmily Communiction: dughter Disposition Pln: nticipte in hospitl deth   Consultnts:   GI  ID  Pllitive  Procedures:   none  Antimicrobils:  Anti-infectives (From dmission, onwrd)   Strt     Dose/Rte Route Frequency Ordered Stop   08/18/18 1500  fidxomicin (DIFICID) tblet  200 mg  Sttus:  Discontinued     200 mg Orl 2 times dily 08/18/18 1401 08/19/18 1151   08/18/18 1415  fidxomicin (DIFICID) tblet 200 mg  Sttus:  Discontinued     200 mg Orl 2 times dily 08/18/18 1401 08/18/18 1403   08/17/18 1800  vncomycin (VANCOCIN) 50 mg/mL orl solution 125 mg  Sttus:  Discontinued     125 mg Orl 4 times dily 08/17/18 1405 08/18/18 1359   08/16/18 1800  vncomycin (VANCOCIN) 50 mg/mL orl solution 500 mg  Sttus:  Discontinued     500 mg Orl 4 times dily 08/16/18 1519 08/17/18 1350   08/16/18 1600  metroNIDAZOLE (FLAGYL) IVPB 500 mg  Sttus:  Discontinued    Note to Phrmcy: Dughter reports ptient took flgyl recently without issues.   500 mg 100 mL/hr over 60 Minutes Intrvenous Every 8 hours 08/16/18 1523 08/19/18 1151   09/03/2018 2200  vncomycin (VANCOCIN) 50 mg/mL orl solution 125 mg  Sttus:  Discontinued     125 mg Orl 4 times dily 08/21/2018 2130 08/16/18 1519     Subjective: Dughter t bedside tody Wonders if she needs dditionl medictions for comfort  Objective: Vitls:   08/22/18 1945 08/22/18 2000 08/23/18 0504 08/23/18 1239  BP:   (!) 72/32 (!) 112/45  Pulse:   91   Resp:   10 11  Temp: 98.7 F (37.1 C) 99.1 F (37.3 C) 98.8 F (37.1 C) 98.6 F (37 C)  TempSrc: Axillry Rectl Axillry Axillry  SpO2:   93% 91%  Weight:      Height:  Intake/Output Summary (Last 24 hours) at 08/23/2018 1523 Last data filed at 08/23/2018 1230 Gross per 24 hour  Intake 0 ml  Output -  Net 0 ml   Filed Weights   08/18/18 0647 08/19/18 0518 08/20/18 0341  Weight: 76.6 kg 79.9 kg 79 kg    Examination:  General: No acute distress. Lungs: slow, labored breathing Neurological: few groans when being moved in bed, but otherwise nonresponsive  Limited exam with comfort measures    Data Reviewed: I have personally reviewed following labs and imaging studies  CBC: Recent Labs  Lab 08/17/18 0602 08/18/18 0529 08/19/18 0556   WBC 22.4* 22.0* 25.6*  NEUTROBS 18.6* 16.6* 18.5*  HGB 12.1 12.6 12.8  HCT 38.4 38.8 39.5  MCV 93.2 90.2 90.6  PLT 289 345 330*   Basic Metabolic Panel: Recent Labs  Lab 08/17/18 0602 08/18/18 0529 08/19/18 0556  N 127* 128* 127*  K 3.4* 3.3* 3.9  CL 101 102 102  CO2 16* 18* 15*  GLUCOSE 135* 91 144*  BUN 17 22 28*  CRETININE 1.32* 1.32* 1.50*  CLCIUM 7.2* 7.4* 7.4*  MG 1.8  --  2.3   GFR: Estimated Creatinine Clearance: 25.3 mL/min () (by C-G formula based on SCr of 1.5 mg/dL (H)). Liver Function Tests: No results for input(s): ST, LT, LKPHOS, BILITOT, PROT, LBUMIN in the last 168 hours. No results for input(s): LIPSE, MYLSE in the last 168 hours. No results for input(s): MMONI in the last 168 hours. Coagulation Profile: No results for input(s): INR, PROTIME in the last 168 hours. Cardiac Enzymes: No results for input(s): CKTOTL, CKMB, CKMBINDEX, TROPONINI in the last 168 hours. BNP (last 3 results) No results for input(s): PROBNP in the last 8760 hours. Hb1C: No results for input(s): HGB1C in the last 72 hours. CBG: No results for input(s): GLUCP in the last 168 hours. Lipid Profile: No results for input(s): CHOL, HDL, LDLCLC, TRIG, CHOLHDL, LDLDIRECT in the last 72 hours. Thyroid Function Tests: No results for input(s): TSH, T4TOTL, FREET4, T3FREE, THYROIDB in the last 72 hours. nemia Panel: No results for input(s): VITMINB12, FOLTE, FERRITIN, TIBC, IRON, RETICCTPCT in the last 72 hours. Sepsis Labs: Recent Labs  Lab 08/17/18 0602 08/18/18 0529 08/19/18 0556  LTICCIDVEN 1.9 1.1 1.3    Recent Results (from the past 240 hour(s))  Clostridium difficile EI     Status: bnormal   Collection Time: 08/14/18 12:00 M   Specimen: Stool   STOOL  Result Value Ref Range Status   C difficile Toxins +B, EI Positive () Negative Final  Gastrointestinal Panel by PCR , Stool     Status: None   Collection Time: 08/09/2018  8:31 PM    Specimen: Stool  Result Value Ref Range Status   Campylobacter species NOT DETECTED NOT DETECTED Final   Plesimonas shigelloides NOT DETECTED NOT DETECTED Final   Salmonella species NOT DETECTED NOT DETECTED Final   Yersinia enterocolitica NOT DETECTED NOT DETECTED Final   Vibrio species NOT DETECTED NOT DETECTED Final   Vibrio cholerae NOT DETECTED NOT DETECTED Final   Enteroaggregative E coli (EEC) NOT DETECTED NOT DETECTED Final   Enteropathogenic E coli (EPEC) NOT DETECTED NOT DETECTED Final   Enterotoxigenic E coli (ETEC) NOT DETECTED NOT DETECTED Final   Shiga like toxin producing E coli (STEC) NOT DETECTED NOT DETECTED Final   Shigella/Enteroinvasive E coli (EIEC) NOT DETECTED NOT DETECTED Final   Cryptosporidium NOT DETECTED NOT DETECTED Final   Cyclospora cayetanensis NOT DETECTED NOT DETECTED Final  Entamoeba histolytica NOT DETECTED NOT DETECTED Final   Giardia lamblia NOT DETECTED NOT DETECTED Final   denovirus F40/41 NOT DETECTED NOT DETECTED Final   strovirus NOT DETECTED NOT DETECTED Final   Norovirus GI/GII NOT DETECTED NOT DETECTED Final   Rotavirus  NOT DETECTED NOT DETECTED Final   Sapovirus (I, II, IV, and V) NOT DETECTED NOT DETECTED Final    Comment: Performed at [facility], [address] [place], [place] [address]  C Difficile Quick Screen w PCR reflex     Status: bnormal   Collection Time: 08/30/2018  8:31 PM   Specimen: Stool  Result Value Ref Range Status   C Diff antigen POSITIVE () NEGTIVE Final   C Diff toxin POSITIVE () NEGTIVE Final   C Diff interpretation Toxin producing C. difficile detected.  Final    Comment: CRITICL RESULT CLLED TO, RED BCK BY ND VERIFIED WITHlfonso Patten GROVES RN 2124 08/07/2018  NVRRO Performed at [facility], [place] [address] [place], [place] [address]   SRS Coronavirus 2 (CEPHEID - Performed in [place] hospital lab), Hosp Order     Status: None   Collection Time: 08/12/2018  9:23 PM    Specimen: Nasopharyngeal Swab  Result Value Ref Range Status   SRS Coronavirus 2 NEGTIVE NEGTIVE Final    Comment: (NOTE) If result is NEGTIVE SRS-CoV-2 target nucleic acids are NOT DETECTED. The SRS-CoV-2 RN is generally detectable in upper and lower  respiratory specimens during the acute phase of infection. The lowest  concentration of SRS-CoV-2 viral copies this assay can detect is 250  copies / mL.  negative result does not preclude SRS-CoV-2 infection  and should not be used as the sole basis for treatment or other  patient management decisions.   negative result may occur with  improper specimen collection / handling, submission of specimen other  than nasopharyngeal swab, presence of viral mutation(s) within the  areas targeted by this assay, and inadequate number of viral copies  (<250 copies / mL).  negative result must be combined with clinical  observations, patient history, and epidemiological information. If result is POSITIVE SRS-CoV-2 target nucleic acids are DETECTED. The SRS-CoV-2 RN is generally detectable in upper and lower  respiratory specimens dur ing the acute phase of infection.  Positive  results are indicative of active infection with SRS-CoV-2.  Clinical  correlation with patient history and other diagnostic information is  necessary to determine patient infection status.  Positive results do  not rule out bacterial infection or co-infection with other viruses. If result is PRESUMPTIVE POSTIVE SRS-CoV-2 nucleic acids MY BE PRESENT.    presumptive positive result was obtained on the submitted specimen  and confirmed on repeat testing.  While 2019 novel coronavirus  (SRS-CoV-2) nucleic acids may be present in the submitted sample  additional confirmatory testing may be necessary for epidemiological  and / or clinical management purposes  to differentiate between  SRS-CoV-2 and other Sarbecovirus currently known to infect humans.  If  clinically indicated additional testing with an alternate test  methodology [phone]) is advised. The SRS-CoV-2 RN is generally  detectable in upper and lower respiratory sp ecimens during the acute  phase of infection. The expected result is Negative. Fact Sheet for Patients:  StrictlyIdeas.no Fact Sheet for Healthcare Providers: BankingDealers.co.za This test is not yet approved or cleared by the [place] [facility] and has been authorized for detection and/or diagnosis of SRS-CoV-2 by FD under an Emergency Use uthorization (EU).  This  EUA will remain in effect (meaning this test can be used) for the duration of the COVID-19 declaration under Section 564(b)(1) of the Act, 21 U.S.C. section 360bbb-3(b)(1), unless the authorization is terminated or revoked sooner. Performed at [facility], [place] [address] [place], [place] [address]   Culture, blood (routine x 2)     Status: None   Collection Time: 08/16/18 12:53 PM   Specimen: BLOOD  Result Value Ref Range Status   Specimen Description   Final    BLOOD RIGHT HAND Performed at [facility] [address], [place], [place] [address]    Special Requests   Final    BOTTLES DRAWN AEROBIC ONLY Blood Culture results may not be optimal due to an inadequate volume of blood received in culture bottles Performed at [facility] [address] [place], [place] [address]    Culture   Final    NO GROWTH 5 DAYS Performed at [facility], [place] [address], [place], [place] [address]    Report Status 08/21/2018 FINAL  Final  Culture, blood (routine x 2)     Status: None   Collection Time: 08/16/18  1:07 PM   Specimen: BLOOD  Result Value Ref Range Status   Specimen Description   Final    BLOOD LEFT HAND Performed at [facility] [address] [place], [place] [address]    Special Requests   Final     BOTTLES DRAWN AEROBIC ONLY Blood Culture results may not be optimal due to an inadequate volume of blood received in culture bottles Performed at [facility] [address], [place], [place] [address]    Culture   Final    NO GROWTH 5 DAYS Performed at [facility], [place] [address] [place], [place] [address]    Report Status 08/21/2018 FINAL  Final         Radiology Studies: No results found.      Scheduled Meds: .  HYDROmorphone (DILAUDID) injection  1 mg Intravenous Q4H  . liver oil-zinc oxide   Topical BID  . LORazepam  2 mg Intravenous Q6H   Continuous Infusions:   LOS: 8 days    Time spent: over 30 min    Fayrene Helper, MD [facility] Pager AMION  If 7PM-7AM, please contact night-coverage [email or web address] Password [facility] 08/23/2018, 3:23 PM

## 2018-08-23 NOTE — Progress Notes (Signed)
Daily Progress Note   Patient Name: Anita Banks       Date: 08/23/2018 DOB: [birth year]  Age: 85 y.o. MRN#: [id number] Attending Physician: Elodia Florence., * Primary Care Physician: [name], MD Admit Date: 08/17/2018  Reason for Consultation/Follow-up: Terminal Care  Subjective: Chart reviewed and discussed with Dr. Florene Glen.    I saw and examined Ms. Criscuolo and met with her daughter at bedside.    We discussed clinical course and care plan.  Her daughter reports knowing that this is the "right thing to do" but it remains emotionally difficult.  We discussed plan for comfort and she reports overall that things have been going well, but she thinks that she may be more uncomfortable with changes and movement and also wonders if medication may be wearing off before next dose is due to be given.  Emotional support and anticipatory guidance provided  Questions and concerns addressed.   PMT will continue to support holistically.  Length of Stay: 8  Current Medications: Scheduled Meds:   liver oil-zinc oxide   Topical BID   LORazepam  2 mg Intravenous Q6H    Continuous Infusions:  HYDROmorphone      PRN Meds: acetaminophen **OR** acetaminophen, HYDROmorphone, LORazepam, ondansetron (ZOFRAN) IV  Physical Exam        General: Somnolent. Does not arouse Heart: Regular rate but distant Lungs: Decreased air movement, scattered rhonchi Abdomen: Soft, nontender, nondistended, positive bowel sounds.  Ext: pulses somewhat diminished Skin: Warm and dry  Vital Signs: BP (!) 112/45 (BP Location: Right Arm)    Pulse 91    Temp 98.6 F (37 C) (Axillary)    Resp 11    Ht _0  (1.6 m)    Wt 79 kg    SpO2 91%    BMI 30.85 kg/m  SpO2: SpO2: 91 % O2  Device: O2 Device: Room Air O2 Flow Rate:    Intake/output summary:   Intake/Output Summary (Last 24 hours) at 08/23/2018 1539 Last data filed at 08/23/2018 1230 Gross per 24 hour  Intake 0 ml  Output --  Net 0 ml   LBM: Last BM Date: 08/23/18 Baseline Weight: Weight: 73.6 kg Most recent weight: Weight: 79 kg       Palliative Assessment/Data:      Patient Active Problem List  Diagnosis Date Noted   Palliative care by specialist    DNR (do not resuscitate)    Pain, generalized    Hypokalemia    FTT (failure to thrive) in adult    Dehydration 08/16/2018   Hyponatremia    AKI (acute kidney injury) ([place])    CKD (chronic kidney disease), stage III (HCC)    C. difficile colitis 09/04/2018   Diarrhea 08/13/2018   Intertrigo 08/13/2018   HTN (hypertension) 08/13/2018   Vitamin D deficiency 12/20/2017   Drug reaction 08/07/2017   Lower extremity edema 08/29/2016   Cough 01/12/2016   Wheezing 01/12/2016   Occipital headache 12/24/2015   Chest pain, atypical 09/13/2015   Acute upper respiratory infection 04/29/2015   Pruritic condition 01/18/2015   Actinic keratoses 09/13/2014   Gait disorder 09/04/2014   Rash and nonspecific skin eruption 09/04/2014   Polymyalgia rheumatica ([place]) 04/20/2011   INSOMNIA, PERSISTENT 06/26/2007   HYPERCHOLESTEROLEMIA 02/20/2007   Depression with anxiety 02/20/2007   Hypothyroidism due to acquired atrophy of thyroid 12/03/2006   Osteoarthritis of left knee 12/03/2006   OSTEOPENIA 12/03/2006   Migraine variant 11/26/2006   Irritable bowel syndrome 11/26/2006    Palliative Care Assessment & Plan   Patient Profile: 85 yo female admitted with c diff colitis now transitioned to comfort care  Recommendations/Plan:  Anxiety: continue scheduled ativan.    Pain/sob: reported concern of distress with movement/bed changes by daughter.  She also thinks pain medication works well but may not last until next  dose is due.  Discussed initiation of continuous infusion and she requested this to be done today.  Will begin at hydromorphone 0.59m/hr.  She also has order for 165mto be given as needed for pain or SOB.  Would have low threshold to give this 20 minutes prior to bed changes and repositioning if she continues to have distress with movement after initiation of drip.  Goals of Care and Additional Recommendations:  Limitations on Scope of Treatment: Full Comfort Care  Code Status:    Code Status Orders  (From admission, onward)         Start     Ordered   08/19/18 1629  Do not attempt resuscitation (DNR)  Continuous    Question Answer Comment  In the event of cardiac or respiratory ARREST Do not call a code blue   In the event of cardiac or respiratory ARREST Do not perform Intubation, CPR, defibrillation or ACLS   In the event of cardiac or respiratory ARREST Use medication by any route, position, wound care, and other measures to relive pain and suffering. May use oxygen, suction and manual treatment of airway obstruction as needed for comfort.      08/19/18 1629        Code Status History    Date Active Date Inactive Code Status Order ID Comments User Context   08/12/2018 2135 08/19/2018 1629 Full Code [id number] GravelMD ED   Advance Care Planning Activity       Prognosis:   Hours - Days  Discharge Planning:  Anticipated Hospital Death  Care plan was discussed with daughter, RN, Dr. PoFlorene GlenThank you for allowing the Palliative Medicine Team to assist in the care of this patient.   Time In: 1500 Time Out: 1550 Total Time 50 Prolonged Time Billed no      Greater than 50%  of this time was spent counseling and coordinating care related to the above assessment and plan.  Markasia Carrol FrDomingo Cocking  MD  Please contact Palliative Medicine Team phone at [phone] for questions and concerns.

## 2018-08-23 NOTE — Progress Notes (Signed)
Patient had 3 incontinent stools overnight. Staff have continued with scheduled medications and patient appears comfortable. Will continue to closely monitor patient.

## 2018-08-24 MED ORDER — GLYCOPYRROLATE 0.2 MG/ML IJ SOLN
0.4000 mg | INTRAMUSCULAR | Status: DC | PRN
  Filled 2018-08-24: qty 2

## 2018-08-24 MED ORDER — HALOPERIDOL LACTATE 5 MG/ML IJ SOLN: 2.0000 mg | Freq: Four times a day (QID) | INTRAMUSCULAR | Status: DC | PRN

## 2018-08-24 NOTE — Progress Notes (Signed)
Daily Progress Note   Patient Name: Anita Banks       Date: 08/17/2018 DOB: 10-Aug-1927  Age: 85 y.o. MRN#: [id number] Attending Physician: Elodia Florence., * Primary Care Physician: [name], MD Admit Date: 08/13/2018  Reason for Consultation/Follow-up: Terminal Care  Subjective: I saw and examined Anita Banks and met with her daughter and son in law at bedside.    Increased vocalizations today with grunting/moaning during encounter.  Daughter reports this has increased this AM.  She did receive extra dilaudid this AM with possibly some improvement.  Ativan given while I was in room as well.  Emotional support and anticipatory guidance provided.  Discussed that I do see changes in respirations today that appear to me as though she is progressing in dying process.  Questions and concerns addressed.   PMT will continue to support holistically.  Length of Stay: 9  Current Medications: Scheduled Meds:   liver oil-zinc oxide   Topical BID   LORazepam  2 mg Intravenous Q6H    Continuous Infusions:  HYDROmorphone 1 mg/hr (08/23/2018 1112)    PRN Meds: acetaminophen **OR** acetaminophen, glycopyrrolate, haloperidol lactate, HYDROmorphone, LORazepam, ondansetron (ZOFRAN) IV  Physical Exam        General: Somnolent. Does not arouse. Moans/vocalization noted Heart: Regular rate but distant Lungs: Decreased air movement, scattered rhonchi, irregular with some apenic pauses Abdomen: Soft, nontender, nondistended, positive bowel sounds.  Ext: pulses somewhat diminished Skin: Warm and dry  Vital Signs: BP (!) 112/45 (BP Location: Right Arm)    Pulse 91    Temp 99.4 F (37.4 C) (Oral)    Resp 11    Ht _0  (1.6 m)    Wt 79 kg    SpO2 91%    BMI 30.85  kg/m  SpO2: SpO2: 91 % O2 Device: O2 Device: Room Air O2 Flow Rate:    Intake/output summary:   Intake/Output Summary (Last 24 hours) at 08/23/2018 1116 Last data filed at 08/23/2018 1918 Gross per 24 hour  Intake 0.39 ml  Output --  Net 0.39 ml   LBM: Last BM Date: 08/23/18 Baseline Weight: Weight: 73.6 kg Most recent weight: Weight: 79 kg       Palliative Assessment/Data:      Patient Active Problem List   Diagnosis Date Noted  Palliative care by specialist    DNR (do not resuscitate)    Pain, generalized    Hypokalemia    FTT (failure to thrive) in adult    Dehydration 08/16/2018   Hyponatremia    AKI (acute kidney injury) ([place])    CKD (chronic kidney disease), stage III (HCC)    C. difficile colitis 08/22/2018   Diarrhea 08/13/2018   Intertrigo 08/13/2018   HTN (hypertension) 08/13/2018   Vitamin D deficiency 12/20/2017   Drug reaction 08/07/2017   Lower extremity edema 08/29/2016   Cough 01/12/2016   Wheezing 01/12/2016   Occipital headache 12/24/2015   Chest pain, atypical 09/13/2015   Acute upper respiratory infection 04/29/2015   Pruritic condition 01/18/2015   Actinic keratoses 09/13/2014   Gait disorder 09/04/2014   Rash and nonspecific skin eruption 09/04/2014   Polymyalgia rheumatica ([place]) 04/20/2011   INSOMNIA, PERSISTENT 06/26/2007   HYPERCHOLESTEROLEMIA 02/20/2007   Depression with anxiety 02/20/2007   Hypothyroidism due to acquired atrophy of thyroid 12/03/2006   Osteoarthritis of left knee 12/03/2006   OSTEOPENIA 12/03/2006   Migraine variant 11/26/2006   Irritable bowel syndrome 11/26/2006    Palliative Care Assessment & Plan   Patient Profile: 85 yo female admitted with c diff colitis now transitioned to comfort care  Recommendations/Plan:  Anxiety: continue scheduled ativan.    Pain/sob: Continued concern of distress with movement/bed changes by daughter.  Also increased groaning over last  few hours.  Will increase continuous hydromorphone to 65m/hr.  She also has order for 142mto be given as needed for pain or SOB.  Would have low threshold to give bolus 20 minutes prior to bed changes and repositioning if she continues to have distress with movement after increase in rate of drip.  Goals of Care and Additional Recommendations:  Limitations on Scope of Treatment: Full Comfort Care  Code Status:    Code Status Orders  (From admission, onward)         Start     Ordered   08/19/18 1629  Do not attempt resuscitation (DNR)  Continuous    Question Answer Comment  In the event of cardiac or respiratory ARREST Do not call a code blue   In the event of cardiac or respiratory ARREST Do not perform Intubation, CPR, defibrillation or ACLS   In the event of cardiac or respiratory ARREST Use medication by any route, position, wound care, and other measures to relive pain and suffering. May use oxygen, suction and manual treatment of airway obstruction as needed for comfort.      08/19/18 1629        Code Status History    Date Active Date Inactive Code Status Order ID Comments User Context   09/04/2018 2135 08/19/2018 1629 Full Code [id number] GravelMD ED   Advance Care Planning Activity       Prognosis:   Hours - Days  Discharge Planning:  Anticipated Hospital Death  Care plan was discussed with daughter, RN, Dr. PoFlorene GlenThank you for allowing the Palliative Medicine Team to assist in the care of this patient.   Time In: 1040 Time Out: 1120 Total Time 40 Prolonged Time Billed no      Greater than 50%  of this time was spent counseling and coordinating care related to the above assessment and plan.  GeMicheline RoughMD  Please contact Palliative Medicine Team phone at [phone] questions and concerns.

## 2018-08-24 NOTE — Progress Notes (Signed)
**Note De-Identified vi Obfusction** PROGRESS NOTE    Anita Banks  VZC:[id number] DOB: 04-30-1927 DOA: 09/05/2018 PCP: Cssndri Anger, MD   Brief Nrrtive: Anita Banks  is  85 y.o. femle, w hypertension, nxiety, migrines, who ws dmitted for C diff colitis.  Filed PO vnc nd now hs been trnsitioned to comfort cre.  Assessment & Pln:   Principl Problem:   C. difficile colitis Active Problems:   Hypothyroidism due to cquired trophy of thyroid   HYPERCHOLESTEROLEMIA   HTN (hypertension)   Dehydrtion   Hypontremi   AKI (cute kidney injury) ([place])   CKD (chronic kidney disese), stge III (HCC)   FTT (filure to thrive) in dult   Hypoklemi   Pllitive cre by specilist   DNR (do not resuscitte)   Pin, generlized  Comfort Cre:  Pt currently ppers comfortble.  Apprecite pllitive cre ssistnce.  She's unresponsive with no orl intke.  At this point in time, I continue to nticipte in hospitl deth with prognosis of hours to dys. 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  HTN:   Hypothyroidism: Continue synthroid  DVT prophylxis: comfort cre Code Sttus: DNR Fmily Communiction: dughter Disposition Pln: nticipte in hospitl deth   Consultnts:   GI  ID  Pllitive  Procedures:   none  Antimicrobils:  Anti-infectives (From dmission, onwrd)   Strt     Dose/Rte Route Frequency Ordered Stop   08/18/18 1500  fidxomicin (DIFICID) tblet  200 mg  Sttus:  Discontinued     200 mg Orl 2 times dily 08/18/18 1401 08/19/18 1151   08/18/18 1415  fidxomicin (DIFICID) tblet 200 mg  Sttus:  Discontinued     200 mg Orl 2 times dily 08/18/18 1401 08/18/18 1403   08/17/18 1800  vncomycin (VANCOCIN) 50 mg/mL orl solution 125 mg  Sttus:  Discontinued     125 mg Orl 4 times dily 08/17/18 1405 08/18/18 1359   08/16/18 1800  vncomycin (VANCOCIN) 50 mg/mL orl solution 500 mg  Sttus:  Discontinued     500 mg Orl 4 times dily 08/16/18 1519 08/17/18 1350   08/16/18 1600  metroNIDAZOLE (FLAGYL) IVPB 500 mg  Sttus:  Discontinued    Note to Phrmcy: Dughter reports ptient took flgyl recently without issues.   500 mg 100 mL/hr over 60 Minutes Intrvenous Every 8 hours 08/16/18 1523 08/19/18 1151   08/17/2018 2200  vncomycin (VANCOCIN) 50 mg/mL orl solution 125 mg  Sttus:  Discontinued     125 mg Orl 4 times dily 09/03/2018 2130 08/16/18 1519     Subjective: Dughter nd SIL t bedside Pt pprently moning erlier, meds djusted by pllitive.  Objective: Vitls:   08/22/18 2000 08/23/18 0504 08/23/18 1239 08/14/2018 0919  BP:  (!) 72/32 (!) 112/45   Pulse:  91    Resp:  10 11   Temp: 99.1 F (37.3 C) 98.8 F (37.1 C) 98.6 F (37 C) 99.4 F (37.4 C)  TempSrc: Rectl Axillry Axillry Orl  SpO2:  93% 91%   Weight:      Height:  Intake/Output Summary (Last 24 hours) at 08/16/2018 1324 Last data filed at 08/23/2018 1918 Gross per 24 hour  Intake 0.39 ml  Output -  Net 0.39 ml   Filed Weights   08/18/18 0647 08/19/18 0518 08/20/18 0341  Weight: 76.6 kg 79.9 kg 79 kg    Examination:  General: ND Lungs: slow, labored breathing Neurological: unresponsive   Limited exam with comfort measures    Data Reviewed: I have personally reviewed following labs and imaging studies  CBC: Recent Labs  Lab 08/18/18 0529 08/19/18 0556  WBC 22.0* 25.6*  NEUTROBS 16.6* 18.5*  HGB 12.6 12.8  HCT 38.8 39.5   MCV 90.2 90.6  PLT 345 992*   Basic Metabolic Panel: Recent Labs  Lab 08/18/18 0529 08/19/18 0556  N 128* 127*  K 3.3* 3.9  CL 102 102  CO2 18* 15*  GLUCOSE 91 144*  BUN 22 28*  CRETININE 1.32* 1.50*  CLCIUM 7.4* 7.4*  MG  --  2.3   GFR: Estimated Creatinine Clearance: 25.3 mL/min () (by C-G formula based on SCr of 1.5 mg/dL (H)). Liver Function Tests: No results for input(s): ST, LT, LKPHOS, BILITOT, PROT, LBUMIN in the last 168 hours. No results for input(s): LIPSE, MYLSE in the last 168 hours. No results for input(s): MMONI in the last 168 hours. Coagulation Profile: No results for input(s): INR, PROTIME in the last 168 hours. Cardiac Enzymes: No results for input(s): CKTOTL, CKMB, CKMBINDEX, TROPONINI in the last 168 hours. BNP (last 3 results) No results for input(s): PROBNP in the last 8760 hours. Hb1C: No results for input(s): HGB1C in the last 72 hours. CBG: No results for input(s): GLUCP in the last 168 hours. Lipid Profile: No results for input(s): CHOL, HDL, LDLCLC, TRIG, CHOLHDL, LDLDIRECT in the last 72 hours. Thyroid Function Tests: No results for input(s): TSH, T4TOTL, FREET4, T3FREE, THYROIDB in the last 72 hours. nemia Panel: No results for input(s): VITMINB12, FOLTE, FERRITIN, TIBC, IRON, RETICCTPCT in the last 72 hours. Sepsis Labs: Recent Labs  Lab 08/18/18 0529 08/19/18 0556  LTICCIDVEN 1.1 1.3    Recent Results (from the past 240 hour(s))  Gastrointestinal Panel by PCR , Stool     Status: None   Collection Time:   8:31 PM   Specimen: Stool  Result Value Ref Range Status   Campylobacter species NOT DETECTED NOT DETECTED Final   Plesimonas shigelloides NOT DETECTED NOT DETECTED Final   Salmonella species NOT DETECTED NOT DETECTED Final   Yersinia enterocolitica NOT DETECTED NOT DETECTED Final   Vibrio species NOT DETECTED NOT DETECTED Final   Vibrio cholerae NOT DETECTED NOT DETECTED Final    Enteroaggregative E coli (EEC) NOT DETECTED NOT DETECTED Final   Enteropathogenic E coli (EPEC) NOT DETECTED NOT DETECTED Final   Enterotoxigenic E coli (ETEC) NOT DETECTED NOT DETECTED Final   Shiga like toxin producing E coli (STEC) NOT DETECTED NOT DETECTED Final   Shigella/Enteroinvasive E coli (EIEC) NOT DETECTED NOT DETECTED Final   Cryptosporidium NOT DETECTED NOT DETECTED Final   Cyclospora cayetanensis NOT DETECTED NOT DETECTED Final   Entamoeba histolytica NOT DETECTED NOT DETECTED Final   Giardia lamblia NOT DETECTED NOT DETECTED Final   denovirus F40/41 NOT DETECTED NOT DETECTED Final   strovirus NOT DETECTED NOT DETECTED Final   Norovirus GI/GII NOT DETECTED NOT DETECTED Final   Rotavirus  NOT DETECTED NOT DETECTED Final   Sapovirus (I, II, IV, and V) NOT DETECTED NOT DETECTED Final    Comment: Performed at [facility], [place], [place] [address]  C Difficile Quick Screen w PCR reflex     Status: bnormal   Collection Time: 08/23/2018  8:31 PM   Specimen: Stool  Result Value Ref Range Status   C Diff antigen POSITIVE () NEGTIVE Final   C Diff toxin POSITIVE () NEGTIVE Final   C Diff interpretation Toxin producing C. difficile detected.  Final    Comment: CRITICL RESULT CLLED TO, RED BCK BY ND VERIFIED WITHlfonso Patten GROVES RN 2124 08/28/2018  NVRRO Performed at [facility], [place] [address] [place], [place] [address]   SRS Coronavirus 2 (CEPHEID - Performed in [place] hospital lab), Hosp Order     Status: None   Collection Time: 08/12/2018  9:23 PM   Specimen: Nasopharyngeal Swab  Result Value Ref Range Status   SRS Coronavirus 2 NEGTIVE NEGTIVE Final    Comment: (NOTE) If result is NEGTIVE SRS-CoV-2 target nucleic acids are NOT DETECTED. The SRS-CoV-2 RN is generally detectable in upper and lower  respiratory specimens during the acute phase of infection. The lowest  concentration of SRS-CoV-2 viral copies  this assay can detect is 250  copies / mL.  negative result does not preclude SRS-CoV-2 infection  and should not be used as the sole basis for treatment or other  patient management decisions.   negative result may occur with  improper specimen collection / handling, submission of specimen other  than nasopharyngeal swab, presence of viral mutation(s) within the  areas targeted by this assay, and inadequate number of viral copies  (<250 copies / mL).  negative result must be combined with clinical  observations, patient history, and epidemiological information. If result is POSITIVE SRS-CoV-2 target nucleic acids are DETECTED. The SRS-CoV-2 RN is generally detectable in upper and lower  respiratory specimens dur ing the acute phase of infection.  Positive  results are indicative of active infection with SRS-CoV-2.  Clinical  correlation with patient history and other diagnostic information is  necessary to determine patient infection status.  Positive results do  not rule out bacterial infection or co-infection with other viruses. If result is PRESUMPTIVE POSTIVE SRS-CoV-2 nucleic acids MY BE PRESENT.    presumptive positive result was obtained on the submitted specimen  and confirmed on repeat testing.  While 2019 novel coronavirus  (SRS-CoV-2) nucleic acids may be present in the submitted sample  additional confirmatory testing may be necessary for epidemiological  and / or clinical management purposes  to differentiate between  SRS-CoV-2 and other Sarbecovirus currently known to infect humans.  If clinically indicated additional testing with an alternate test  methodology [phone]) is advised. The SRS-CoV-2 RN is generally  detectable in upper and lower respiratory sp ecimens during the acute  phase of infection. The expected result is Negative. Fact Sheet for Patients:  StrictlyIdeas.no Fact Sheet for Healthcare Providers:  BankingDealers.co.za This test is not yet approved or cleared by the [place] [facility] and has been authorized for detection and/or diagnosis of SRS-CoV-2 by FD under an Emergency Use uthorization (EU).  This EU will remain in effect (meaning this test can be used) for the duration of the COVID-19 declaration under Section 564(b)(1) of the ct, 21 U.S.C. section 360bbb-3(b)(1), unless the authorization is terminated or revoked sooner. Performed at [facility], [place] [address] [place], [place] [address]   Culture, blood (routine x 2)     Status: None   Collection Time: 08/16/18 12:53 PM  Specimen: BLOOD  Result Value Ref Range Status   Specimen Description   Final    BLOOD RIGHT HAND Performed at [place] [address], [place], [place] [address]    Special Requests   Final    BOTTLES DRAWN AEROBIC ONLY Blood Culture results may not be optimal due to an inadequate volume of blood received in culture bottles Performed at [place] [address] [place], [place] [address]    Culture   Final    NO GROWTH 5 DAYS Performed at [facility], [place] [address] [place], [place] [address]    Report Status 08/21/2018 FINAL  Final  Culture, blood (routine x 2)     Status: None   Collection Time: 08/16/18  1:07 PM   Specimen: BLOOD  Result Value Ref Range Status   Specimen Description   Final    BLOOD LEFT HAND Performed at [place] [address] [place], [place] [address]    Special Requests   Final    BOTTLES DRAWN AEROBIC ONLY Blood Culture results may not be optimal due to an inadequate volume of blood received in culture bottles Performed at [place] [address], [place], [place] [address]    Culture   Final    NO GROWTH 5 DAYS Performed at [facility], [place] [address] [place], [place] [address]    Report Status 08/21/2018  FINAL  Final         Radiology Studies: No results found.      Scheduled Meds: . liver oil-zinc oxide   Topical BID  . LORazepam  2 mg Intravenous Q6H   Continuous Infusions: . HYDROmorphone 1 mg/hr (08/07/2018 1112)     LOS: 9 days    Time spent: over 30 min    Fayrene Helper, MD [facility] Pager AMION  If 7PM-7AM, please contact night-coverage [email or web address] Password [email or web address] 08/09/2018, 1:24 PM

## 2018-08-24 NOTE — Progress Notes (Signed)
Pt's daughter and son-in-law in room with pt; Family called RN into room where pt was noted to have no pulse or RR. This was verified by 2nd RN. Dilaudud gtt stopped. Pt's daughter Loyal Jacobson states that they will provide funeral arrangements tomorrow (2018-09-05). Daughter took pt's clothing and gold necklace home with them; pt's watch, a timex with black leather band remains on pt's left wrist. [facility] [employer] called at [phone], responder's name is Bethena Roys. Referral # [id number] provided.

## 2018-09-07 NOTE — Death Summary Note (Addendum)
DEATH SUMMARY   Patient Details  Name: Anita Banks MRN: [id number] DOB: February 25, 1927  Admission/Discharge Information   Admit Date:  August 22, 2018  Date of Death: Date of Death: 08-31-2018  Time of Death: Time of Death: May 08, 2138  Length of Stay: 04-22-2022  Referring Physician: [name], MD   Reason(s) for Hospitalization   C. Diff pancolitis  Diagnoses  Preliminary cause of death:  sepsis secondary to C. Diff pancolitis Secondary Diagnoses (including complications and co-morbidities):  Principal Problem:   C. difficile colitis Active Problems:   Hypothyroidism due to acquired atrophy of thyroid   HYPERCHOLESTEROLEMIA   HTN (hypertension)   Dehydration   Hyponatremia   AKI (acute kidney injury) ([place])   CKD (chronic kidney disease), stage III (HCC)   FTT (failure to thrive) in adult   Hypokalemia   Palliative care by specialist   DNR (do not resuscitate)   Pain, generalized   Brief Hospital Course (including significant findings, care, treatment, and services provided and events leading to death)  Anita Banks is a 85 y.o. year old female  w hypertension, anxiety, migraines, h/o diverticulitis (07/29/18) and uti, tx with omnicef and Flagyl, , apparently c/o diarrhea for 3 days prior to admission. Pt found to be C. Diff + on admission 2018-08-22  Wbc 22, CT scan- pancolitis.   Pt started on oral vancomycin, and GI consulted.    Pt also hydrated w D5w with sodium bicarbonate due to non-AG acidosis secondary to diarrhea.  Pt had oral vancomycin increased to 500mg  po qid and started on  iv Flagyl by GI.  Pt thought to be septic from her C. Diff.    ID consulted and recommended Dificid and Flagyl.  Pt did not improve despite abx.  Palliative therapy consulted on 08/20/2018, and goals of care discussed with family.  Pt DNR, and made comfort care.  Pt passed tonight at [id number] on 08-31-18 w family present .      Pertinent Labs and Studies  Significant Diagnostic Studies Ct  Abdomen Pelvis W Contrast  Result Date: 08/22/2018 CLINICAL DATA:  Pt BIB EMS from home. Pt speaks [place] only. Per pt family, pt is lethargic, increased weakness, diarrhea x 3 days. Per pt family, pt on antibiotics for various infections. EXAM: CT ABDOMEN AND PELVIS WITH CONTRAST TECHNIQUE: Multidetector CT imaging of the abdomen and pelvis was performed using the standard protocol following bolus administration of intravenous contrast. CONTRAST:  132mL OMNIPAQUE IOHEXOL 300 MG/ML  SOLN COMPARISON:  CT of the abdomen and pelvis on 07/29/2018 FINDINGS: Lower chest: The heart is mildly enlarged. There is atherosclerotic calcification of the coronary vessels. Minimal atelectasis identified at the lung bases. Hepatobiliary: No focal liver abnormality is seen. No radiopaque gallstones, biliary dilatation, or pericholecystic inflammatory changes. Pancreas: Unremarkable. No pancreatic ductal dilatation or surrounding inflammatory changes. Spleen: Normal in size without focal abnormality. Adrenals/Urinary Tract: The adrenal glands are normal in appearance. Small sub centimeter cysts are identified within the kidneys. No suspicious renal mass. No hydronephrosis. The ureters are unremarkable. Urinary bladder is unremarkable. Stomach/Bowel: Small hiatal hernia. Stomach is otherwise unremarkable. Small bowel loops are unremarkable. There is marked edema of the colon, throughout its course. There is pericolonic fluid. There is associated mesenteric edema in the findings are consistent with acute colitis. Vascular/Lymphatic: There is atherosclerotic calcification of the abdominal aorta. No associated aneurysm. No retroperitoneal or mesenteric adenopathy. Reproductive: The uterus is present. A pessary is in place. No adnexal mass. Other: There is a small  amount of ascites, extending into and RIGHT inguinal hernia. Musculoskeletal: Moderate degenerative changes. There is anterolisthesis of L4 on L5. There is retrolisthesis of L1  on L2. IMPRESSION: 1. Marked edema of the colon throughout its course with pericolonic fluid and mesenteric edema. Findings are consistent with acute pancolitis, most likely infectious. 2. Small amount of ascites, extending into and RIGHT inguinal hernia. 3. Small hiatal hernia. 4.  Aortic atherosclerosis.  (ICD10-I70.0) 5. Coronary artery disease. Electronically Signed   By: Nolon Nations M.D.   On: 08/11/2018 19:21   Dg Chest Port 1 View  Result Date: 08/17/2018 CLINICAL DATA:  Wheezing EXAM: PORTABLE CHEST 1 VIEW COMPARISON:  08/07/2017 FINDINGS: Low volume AP portable examination with minimal bibasilar atelectasis or scarring. No acute appearing airspace opacity. The heart and mediastinum are normal in size. IMPRESSION: Low volume AP portable examination with minimal bibasilar atelectasis or scarring. No acute appearing airspace opacity. The heart and mediastinum are normal in size. Electronically Signed   By: Eddie Candle M.D.   On: 08/17/2018 12:16   Ct Renal Stone Study  Result Date: 07/29/2018 CLINICAL DATA:  Flank pain, stone disease suspected. Fever, nausea and vomiting. EXAM: CT ABDOMEN AND PELVIS WITHOUT CONTRAST TECHNIQUE: Multidetector CT imaging of the abdomen and pelvis was performed following the standard protocol without IV contrast. COMPARISON:  CT 05/19/2008 FINDINGS: Lower chest: Mild atelectasis. Mild cardiomegaly with coronary artery calcifications. Hepatobiliary: Mild motion artifact. No focal liver abnormality is seen. No gallstones, gallbladder wall thickening, or biliary dilatation. Pancreas: No ductal dilatation or inflammation. Spleen: Normal in size without focal abnormality. Adrenals/Urinary Tract: Normal adrenal glands. Bilateral renal pelvis prominence without ureteral dilatation. No urolithiasis. Limited assessment for perinephric edema due to motion. Indentation of the posterior bladder from pessary. No bladder wall thickening. Stomach/Bowel: Small hiatal hernia. Stomach  is nondistended. Appendix appears normal. No evidence of small bowel wall thickening, distention, or inflammatory changes. Sigmoid colonic diverticulosis, mild fat stranding about the mid sigmoid diverticulum. No perforation or abscess. Vascular/Lymphatic: Aortic atherosclerosis without aneurysm. Small retroperitoneal nodes not enlarged by size criteria. Reproductive: Uterus and bilateral adnexa are unremarkable. Pessary in place. Other: No free air, free fluid, or intra-abdominal fluid collection. Small fat containing umbilical hernia. Musculoskeletal: Grade 1 anterolisthesis of L4 on L5 likely facet mediated. Multilevel degenerative change throughout spine. There are no acute or suspicious osseous abnormalities. IMPRESSION: 1. Mild uncomplicated sigmoid diverticulitis. 2. Bilateral renal pelvis prominence without ureteral dilatation or urolithiasis, likely extrarenal pelvis configuration. 3. Aortic Atherosclerosis (ICD10-I70.0). Coronary artery calcifications. Electronically Signed   By: Keith Rake M.D.   On: 07/29/2018 23:36    Microbiology Recent Results (from the past 240 hour(s))  Gastrointestinal Panel by PCR , Stool     Status: None   Collection Time: 08/29/2018  8:31 PM   Specimen: Stool  Result Value Ref Range Status   Campylobacter species NOT DETECTED NOT DETECTED Final   Plesimonas shigelloides NOT DETECTED NOT DETECTED Final   Salmonella species NOT DETECTED NOT DETECTED Final   Yersinia enterocolitica NOT DETECTED NOT DETECTED Final   Vibrio species NOT DETECTED NOT DETECTED Final   Vibrio cholerae NOT DETECTED NOT DETECTED Final   Enteroaggregative E coli (EAEC) NOT DETECTED NOT DETECTED Final   Enteropathogenic E coli (EPEC) NOT DETECTED NOT DETECTED Final   Enterotoxigenic E coli (ETEC) NOT DETECTED NOT DETECTED Final   Shiga like toxin producing E coli (STEC) NOT DETECTED NOT DETECTED Final   Shigella/Enteroinvasive E coli (EIEC) NOT DETECTED NOT DETECTED Final  Cryptosporidium NOT DETECTED NOT DETECTED Final   Cyclospora cayetanensis NOT DETECTED NOT DETECTED Final   Entamoeba histolytica NOT DETECTED NOT DETECTED Final   Giardia lamblia NOT DETECTED NOT DETECTED Final   Adenovirus F40/41 NOT DETECTED NOT DETECTED Final   Astrovirus NOT DETECTED NOT DETECTED Final   Norovirus GI/GII NOT DETECTED NOT DETECTED Final   Rotavirus A NOT DETECTED NOT DETECTED Final   Sapovirus (I, II, IV, and V) NOT DETECTED NOT DETECTED Final    Comment: Performed at [facility], [address] [place], [place] [address]  C Difficile Quick Screen w PCR reflex     Status: Abnormal   Collection Time: 09/04/2018  8:31 PM   Specimen: Stool  Result Value Ref Range Status   C Diff antigen POSITIVE (A) NEGATIVE Final   C Diff toxin POSITIVE (A) NEGATIVE Final   C Diff interpretation Toxin producing C. difficile detected.  Final    Comment: CRITICAL RESULT CALLED TO, READ BACK BY AND VERIFIED WITHAlfonso Patten GROVES RN 2124 08/14/2018 A NAVARRO Performed at [facility], [place] [address] [place], [place] [address]   SARS Coronavirus 2 (CEPHEID - Performed in [place] hospital lab), Hosp Order     Status: None   Collection Time: 08/08/2018  9:23 PM   Specimen: Nasopharyngeal Swab  Result Value Ref Range Status   SARS Coronavirus 2 NEGATIVE NEGATIVE Final    Comment: (NOTE) If result is NEGATIVE SARS-CoV-2 target nucleic acids are NOT DETECTED. The SARS-CoV-2 RNA is generally detectable in upper and lower  respiratory specimens during the acute phase of infection. The lowest  concentration of SARS-CoV-2 viral copies this assay can detect is 250  copies / mL. A negative result does not preclude SARS-CoV-2 infection  and should not be used as the sole basis for treatment or other  patient management decisions.  A negative result may occur with  improper specimen collection / handling, submission of specimen other  than nasopharyngeal swab, presence of  viral mutation(s) within the  areas targeted by this assay, and inadequate number of viral copies  (<250 copies / mL). A negative result must be combined with clinical  observations, patient history, and epidemiological information. If result is POSITIVE SARS-CoV-2 target nucleic acids are DETECTED. The SARS-CoV-2 RNA is generally detectable in upper and lower  respiratory specimens dur ing the acute phase of infection.  Positive  results are indicative of active infection with SARS-CoV-2.  Clinical  correlation with patient history and other diagnostic information is  necessary to determine patient infection status.  Positive results do  not rule out bacterial infection or co-infection with other viruses. If result is PRESUMPTIVE POSTIVE SARS-CoV-2 nucleic acids MAY BE PRESENT.   A presumptive positive result was obtained on the submitted specimen  and confirmed on repeat testing.  While 2019 novel coronavirus  (SARS-CoV-2) nucleic acids may be present in the submitted sample  additional confirmatory testing may be necessary for epidemiological  and / or clinical management purposes  to differentiate between  SARS-CoV-2 and other Sarbecovirus currently known to infect humans.  If clinically indicated additional testing with an alternate test  methodology [phone]) is advised. The SARS-CoV-2 RNA is generally  detectable in upper and lower respiratory sp ecimens during the acute  phase of infection. The expected result is Negative. Fact Sheet for Patients:  StrictlyIdeas.no Fact Sheet for Healthcare Providers: BankingDealers.co.za This test is not yet approved or cleared by the [place] FDA and has been authorized  for detection and/or diagnosis of SARS-CoV-2 by FDA under an Emergency Use Authorization (EUA).  This EUA will remain in effect (meaning this test can be used) for the duration of the COVID-19 declaration under Section  564(b)(1) of the Act, 21 U.S.C. section 360bbb-3(b)(1), unless the authorization is terminated or revoked sooner. Performed at [facility], [place] [address], [place], [place] [address]   Culture, blood (routine x 2)     Status: None   Collection Time: 08/16/18 12:53 PM   Specimen: BLOOD  Result Value Ref Range Status   Specimen Description   Final    BLOOD RIGHT HAND Performed at [place] [address] [place], [place] [address]    Special Requests   Final    BOTTLES DRAWN AEROBIC ONLY Blood Culture results may not be optimal due to an inadequate volume of blood received in culture bottles Performed at [facility] [address] [place], [place] [address]    Culture   Final    NO GROWTH 5 DAYS Performed at [facility], [place] [address], [place], [place] [address]    Report Status 08/21/2018 FINAL  Final  Culture, blood (routine x 2)     Status: None   Collection Time: 08/16/18  1:07 PM   Specimen: BLOOD  Result Value Ref Range Status   Specimen Description   Final    BLOOD LEFT HAND Performed at [place] [address], [place], [place] [address]    Special Requests   Final    BOTTLES DRAWN AEROBIC ONLY Blood Culture results may not be optimal due to an inadequate volume of blood received in culture bottles Performed at [place] [address] [place], [place] [address]    Culture   Final    NO GROWTH 5 DAYS Performed at [facility], [place] [address] [place], [place] [address]    Report Status 08/21/2018 FINAL  Final    Lab Basic Metabolic Panel: Recent Labs  Lab 08/18/18 0529 08/19/18 0556  NA 128* 127*  K 3.3* 3.9  CL 102 102  CO2 18* 15*  GLUCOSE 91 144*  BUN 22 28*  CREATININE 1.32* 1.50*  CALCIUM 7.4* 7.4*  MG  --  2.3   Liver Function Tests: No results for input(s): AST, ALT, ALKPHOS, BILITOT, PROT, ALBUMIN in the last 168  hours. No results for input(s): LIPASE, AMYLASE in the last 168 hours. No results for input(s): AMMONIA in the last 168 hours. CBC: Recent Labs  Lab 08/18/18 0529 08/19/18 0556  WBC 22.0* 25.6*  NEUTROABS 16.6* 18.5*  HGB 12.6 12.8  HCT 38.8 39.5  MCV 90.2 90.6  PLT 345 402*   Cardiac Enzymes: No results for input(s): CKTOTAL, CKMB, CKMBINDEX, TROPONINI in the last 168 hours. Sepsis Labs: Recent Labs  Lab 08/18/18 0529 08/19/18 0556  WBC 22.0* 25.6*  LATICACIDVEN 1.1 1.3    Procedures/Operations   CT scan abd/ pelvis 08/07/2018 => pancolitis  CXR 08/17/2018 -> IMPRESSION: Low volume AP portable examination with minimal bibasilar atelectasis or scarring. No acute appearing airspace opacity. The heart and mediastinum are normal in size.  Time spent 30 minutes  Jani Gravel 08/23/2018, 11:23 PM

## 2018-09-07 DEATH — deceased

## 2019-08-02 IMAGING — DX DG CHEST 2V
2 series · 2 of 2 positions shown · non-contrast
Comparison: 01/12/2016

CLINICAL DATA: Cough for 2 weeks, history hypertension, polymyalgia
rheumatica, irritable bowel syndrome

EXAM:
CHEST - 2 VIEW

[chest pa]
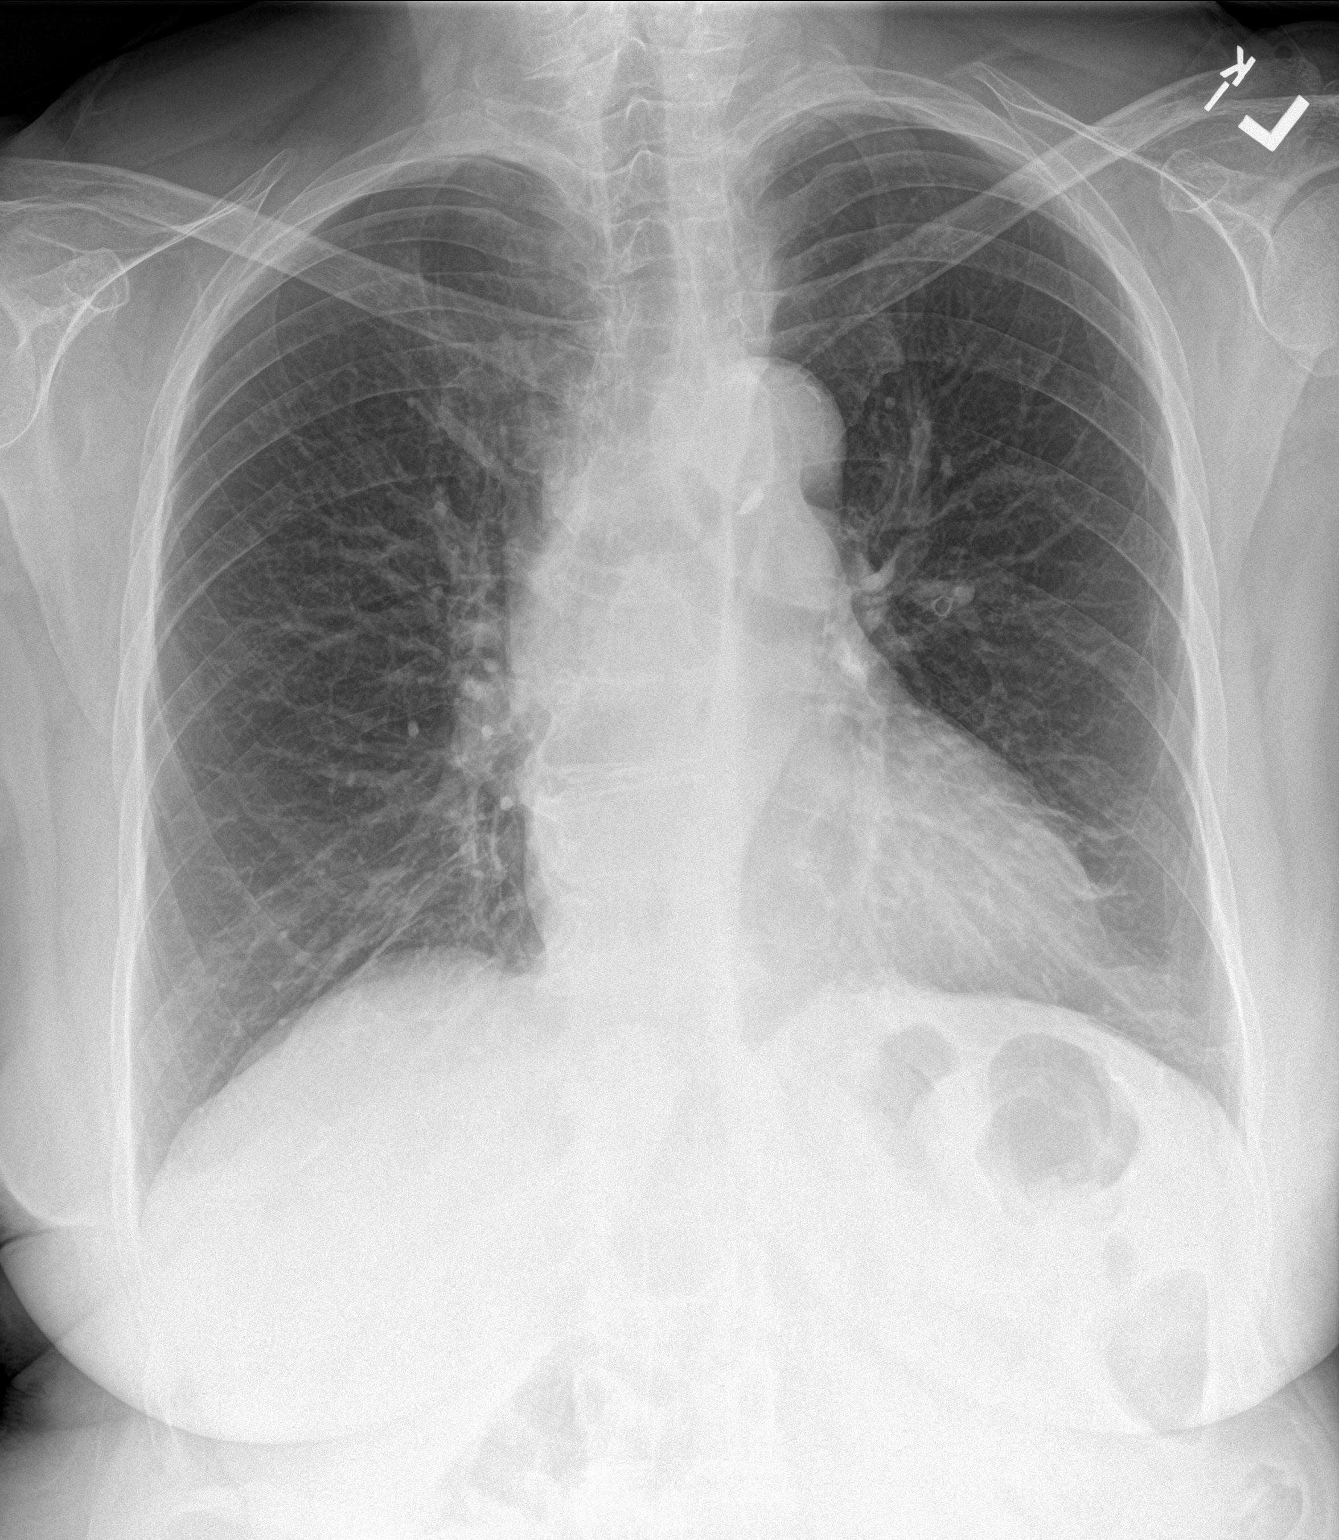

[chest lat]
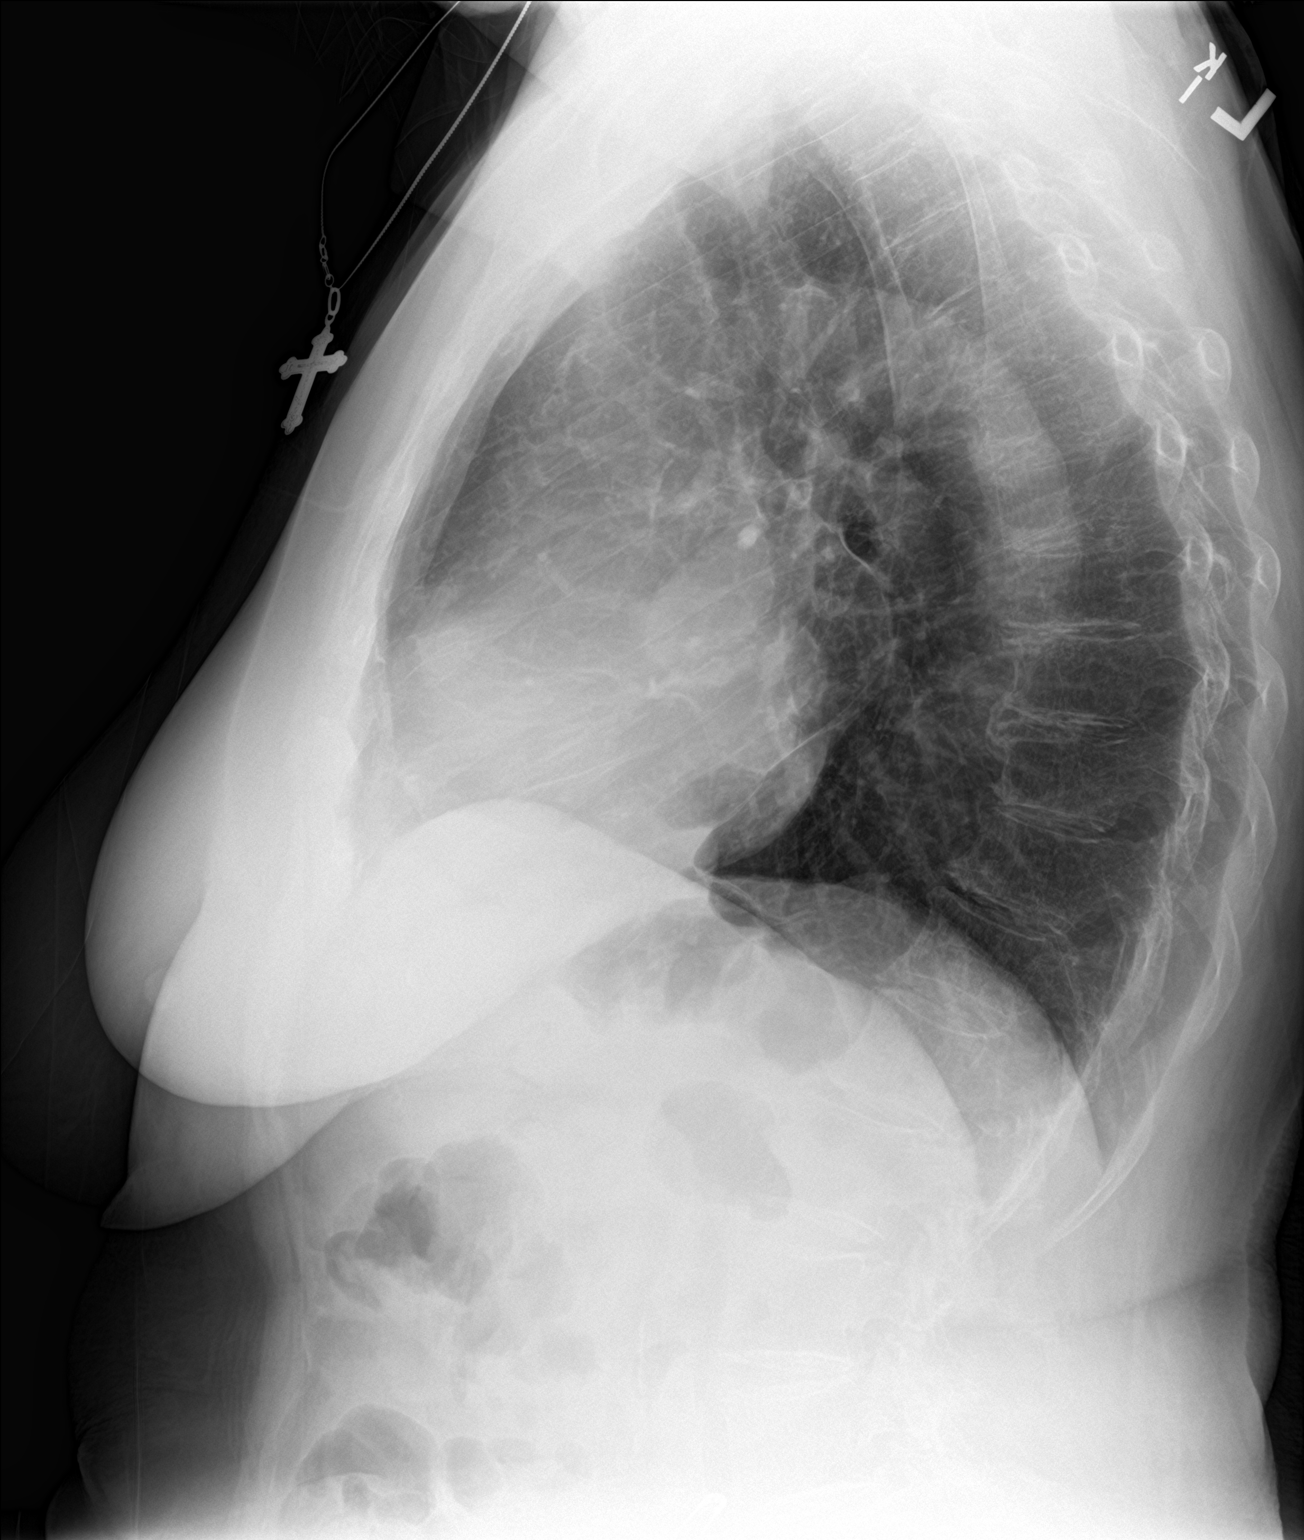

[2 of 2 positions shown; findings below may reference images not displayed]

FINDINGS: Upper normal heart size.

Atherosclerotic calcification aorta.

Mediastinal contours and pulmonary vascularity normal.

Bronchitic changes with minimal persistent bibasilar atelectasis.

No acute infiltrate, pleural effusion or pneumothorax.

Osseous demineralization.
IMPRESSION: Bronchitic changes with persistent minimal bibasilar atelectasis.
